# Patient Record
Sex: Female | Born: 1965 | Race: White | Hispanic: No | Smoking: Never smoker
Health system: Southern US, Community
[De-identification: ages and names within clinical notes are randomized; demographics above are authoritative.]

## PROBLEM LIST (undated history)

## (undated) DIAGNOSIS — R002 Palpitations: Secondary | ICD-10-CM

## (undated) DIAGNOSIS — G43909 Migraine, unspecified, not intractable, without status migrainosus: Secondary | ICD-10-CM

## (undated) DIAGNOSIS — L509 Urticaria, unspecified: Secondary | ICD-10-CM

## (undated) DIAGNOSIS — G2581 Restless legs syndrome: Secondary | ICD-10-CM

## (undated) DIAGNOSIS — F909 Attention-deficit hyperactivity disorder, unspecified type: Secondary | ICD-10-CM

## (undated) DIAGNOSIS — R51 Headache: Secondary | ICD-10-CM

## (undated) DIAGNOSIS — T783XXA Angioneurotic edema, initial encounter: Secondary | ICD-10-CM

## (undated) DIAGNOSIS — B009 Herpesviral infection, unspecified: Secondary | ICD-10-CM

## (undated) DIAGNOSIS — B029 Zoster without complications: Secondary | ICD-10-CM

## (undated) DIAGNOSIS — R42 Dizziness and giddiness: Secondary | ICD-10-CM

## (undated) DIAGNOSIS — R519 Headache, unspecified: Secondary | ICD-10-CM

## (undated) DIAGNOSIS — J189 Pneumonia, unspecified organism: Secondary | ICD-10-CM

## (undated) DIAGNOSIS — L719 Rosacea, unspecified: Secondary | ICD-10-CM

## (undated) DIAGNOSIS — F4310 Post-traumatic stress disorder, unspecified: Secondary | ICD-10-CM

## (undated) DIAGNOSIS — S069XAA Unspecified intracranial injury with loss of consciousness status unknown, initial encounter: Secondary | ICD-10-CM

## (undated) DIAGNOSIS — I10 Essential (primary) hypertension: Secondary | ICD-10-CM

## (undated) DIAGNOSIS — D649 Anemia, unspecified: Secondary | ICD-10-CM

## (undated) DIAGNOSIS — J45909 Unspecified asthma, uncomplicated: Secondary | ICD-10-CM

## (undated) DIAGNOSIS — K648 Other hemorrhoids: Secondary | ICD-10-CM

## (undated) DIAGNOSIS — S069X9A Unspecified intracranial injury with loss of consciousness of unspecified duration, initial encounter: Secondary | ICD-10-CM

## (undated) HISTORY — DX: Urticaria, unspecified: L50.9

## (undated) HISTORY — DX: Attention-deficit hyperactivity disorder, unspecified type: F90.9

## (undated) HISTORY — DX: Unspecified intracranial injury with loss of consciousness status unknown, initial encounter: S06.9XAA

## (undated) HISTORY — PX: HEMORRHOID BANDING: SHX5850

## (undated) HISTORY — DX: Dizziness and giddiness: R42

## (undated) HISTORY — DX: Essential (primary) hypertension: I10

## (undated) HISTORY — DX: Herpesviral infection, unspecified: B00.9

## (undated) HISTORY — DX: Other hemorrhoids: K64.8

## (undated) HISTORY — DX: Angioneurotic edema, initial encounter: T78.3XXA

## (undated) HISTORY — PX: COLONOSCOPY: SHX174

## (undated) HISTORY — DX: Zoster without complications: B02.9

## (undated) HISTORY — PX: OTHER SURGICAL HISTORY: SHX169

## (undated) HISTORY — DX: Post-traumatic stress disorder, unspecified: F43.10

## (undated) HISTORY — DX: Unspecified intracranial injury with loss of consciousness of unspecified duration, initial encounter: S06.9X9A

## (undated) HISTORY — DX: Rosacea, unspecified: L71.9

---

## 2001-02-19 ENCOUNTER — Encounter: Payer: Self-pay | Admitting: Internal Medicine

## 2001-02-19 ENCOUNTER — Encounter: Admission: RE | Admit: 2001-02-19 | Discharge: 2001-02-19 | Payer: Self-pay | Admitting: Internal Medicine

## 2001-04-06 ENCOUNTER — Ambulatory Visit (HOSPITAL_COMMUNITY): Admission: RE | Admit: 2001-04-06 | Discharge: 2001-04-06 | Payer: Self-pay | Admitting: Internal Medicine

## 2001-04-06 ENCOUNTER — Encounter: Payer: Self-pay | Admitting: Internal Medicine

## 2001-08-23 ENCOUNTER — Encounter: Admission: RE | Admit: 2001-08-23 | Discharge: 2001-08-23 | Payer: Self-pay | Admitting: Internal Medicine

## 2001-08-23 ENCOUNTER — Encounter: Payer: Self-pay | Admitting: Internal Medicine

## 2004-09-19 ENCOUNTER — Emergency Department (HOSPITAL_COMMUNITY): Admission: EM | Admit: 2004-09-19 | Discharge: 2004-09-19 | Payer: Self-pay | Admitting: Emergency Medicine

## 2005-08-22 ENCOUNTER — Emergency Department (HOSPITAL_COMMUNITY): Admission: EM | Admit: 2005-08-22 | Discharge: 2005-08-22 | Payer: Self-pay | Admitting: Emergency Medicine

## 2006-10-27 ENCOUNTER — Other Ambulatory Visit: Admission: RE | Admit: 2006-10-27 | Discharge: 2006-10-27 | Payer: Self-pay | Admitting: Family Medicine

## 2007-01-14 ENCOUNTER — Ambulatory Visit (HOSPITAL_COMMUNITY): Admission: RE | Admit: 2007-01-14 | Discharge: 2007-01-14 | Payer: Self-pay | Admitting: Orthopedic Surgery

## 2008-11-07 ENCOUNTER — Other Ambulatory Visit: Admission: RE | Admit: 2008-11-07 | Discharge: 2008-11-07 | Payer: Self-pay | Admitting: Family Medicine

## 2010-09-21 ENCOUNTER — Encounter: Payer: Self-pay | Admitting: Family Medicine

## 2010-09-21 ENCOUNTER — Ambulatory Visit (INDEPENDENT_AMBULATORY_CARE_PROVIDER_SITE_OTHER): Payer: BC Managed Care – PPO | Admitting: Family Medicine

## 2010-09-21 VITALS — BP 130/88 | Temp 98.1°F | Ht 65.0 in | Wt 196.0 lb

## 2010-09-21 DIAGNOSIS — J45909 Unspecified asthma, uncomplicated: Secondary | ICD-10-CM

## 2010-09-21 MED ORDER — BECLOMETHASONE DIPROPIONATE 40 MCG/ACT IN AERS: INHALATION_SPRAY | RESPIRATORY_TRACT | Status: DC

## 2010-09-21 NOTE — Patient Instructions (Signed)
Drink 24 ounces of water daily.  One puff of Qvar twice daily.  Albuterol p.r.n.  Claritin 10 mg plain every morning.  Return in two weeks for a 30 minute appointment for a physical examination

## 2010-09-21 NOTE — Progress Notes (Signed)
  Subjective:    Patient ID: Samantha Delacruz, female    DOB: 05/03/1966, 45 y.o.   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  She states that 19 days ago.  She went to an urgent care because her cough.  They x-rayed her sinuses and chest x-ray and told her that she had walking pneumonia.  They treated her with Biaxin 500 mg b.i.d. X 1 week prednisone burst and taper and gave her an albuterol inhaler.  She has had a history of recurrent asthma over the last 10 years.  However, she has never had a workup to determine etiology of her asthma.  She finished her medication however, she still coughing, and is here for evaluation of the persistent cough.  She's had one child by C-section, episodic migraines, recurrent urinary tract infections that are now resolved, recurrent cutaneous herpes, and some seasonal allergic rhinitis.  Last Pap and pelvic and mammogram two years ago.  Pulmonary review of systems negative except for a 10 year history of asthma.  Again, no workup has been done.  Tetanus booster 2009.   Review of Systems Negative except for above    Objective:   Physical Exam She is a well-developed, well-nourished, female, in no acute distress.  Examination of the HEENT were negative.  The neck was supple.  Thyroid was not enlarged.  No adenopathy.  Pulmonary exam shows symmetrical.  Breath sounds late expiratory wheezing bilaterally       Assessment & Plan:  Asthma  Plan lots of liquids, Qvar 40,,,,,,,,One puff  B.i.d.,Claritin plain, 10 mg q.a.m., albuterol p.r.n., follow-up CPX in two weeks.  Get old records

## 2010-09-30 ENCOUNTER — Encounter: Payer: Self-pay | Admitting: Family Medicine

## 2010-09-30 ENCOUNTER — Ambulatory Visit (INDEPENDENT_AMBULATORY_CARE_PROVIDER_SITE_OTHER): Payer: BC Managed Care – PPO | Admitting: Family Medicine

## 2010-09-30 ENCOUNTER — Other Ambulatory Visit (HOSPITAL_COMMUNITY)
Admission: RE | Admit: 2010-09-30 | Discharge: 2010-09-30 | Disposition: A | Discharge status: Home / Self Care | Payer: BC Managed Care – PPO | Source: Ambulatory Visit | Attending: Family Medicine | Admitting: Family Medicine

## 2010-09-30 VITALS — BP 110/80 | Temp 98.1°F | Ht 65.0 in | Wt 193.0 lb

## 2010-09-30 DIAGNOSIS — F908 Attention-deficit hyperactivity disorder, other type: Secondary | ICD-10-CM

## 2010-09-30 DIAGNOSIS — J45901 Unspecified asthma with (acute) exacerbation: Secondary | ICD-10-CM

## 2010-09-30 DIAGNOSIS — L719 Rosacea, unspecified: Secondary | ICD-10-CM

## 2010-09-30 DIAGNOSIS — F909 Attention-deficit hyperactivity disorder, unspecified type: Secondary | ICD-10-CM

## 2010-09-30 DIAGNOSIS — K625 Hemorrhage of anus and rectum: Secondary | ICD-10-CM

## 2010-09-30 DIAGNOSIS — J309 Allergic rhinitis, unspecified: Secondary | ICD-10-CM

## 2010-09-30 DIAGNOSIS — Z0000 Encounter for general adult medical examination without abnormal findings: Secondary | ICD-10-CM

## 2010-09-30 DIAGNOSIS — Z01419 Encounter for gynecological examination (general) (routine) without abnormal findings: Secondary | ICD-10-CM | POA: Insufficient documentation

## 2010-09-30 LAB — POCT URINALYSIS DIPSTICK
Leukocytes, UA: NEGATIVE
Spec Grav, UA: 1.02
Urobilinogen, UA: 0.2
pH, UA: 5

## 2010-09-30 MED ORDER — DOXYCYCLINE HYCLATE 100 MG PO TABS: 100.0000 mg | ORAL_TABLET | Freq: Two times a day (BID) | ORAL | Status: AC

## 2010-09-30 NOTE — Progress Notes (Signed)
  Subjective:    Patient ID: Samantha Delacruz, female    DOB: 19-Oct-1965, 45 y.o.   MRN: [id number]  HPI [name]  is a 45 year old female, married Nonsmoker who comes in today for a physical examination for evaluation of allergic rhinitis, asthma, rosacea, ADHD, intermittent rectal bleeding x 3 years.  She has allergic rhinitis, for which he takes Claritin, 10 mg daily.  She has underlying asthma, for which she takes Qvar 40 does 1 cup b.i.d.  She has a long-standing history of ADHD.  She was evaluated by a psychiatrist about 6 years ago and given Adderall parent coverages caused her mood to be depressed and sleep dysfunction.  Therefore, she stopped it.  She also has a history of rosacea.  She would like to discuss treatment options.  She also has a history of intermittent bright red blood.  The bleeding for 3 years.  She states she went to see a gastroenterologist at couple years ago and was told it was nothing to worry about.  The bleeding has persisted.  Negative family history of colon cancer or polyps.  She gets routine eye care not dental care and does not check her breasts monthly.  Last mammogram 5 years ago.  LMP 116 normal birth control.  Her husband is disabled.  There and able to have intercourse.  Tetanus booster 2006.   Review of Systems  Constitutional: Negative.   HENT: Negative.   Eyes: Negative.   Respiratory: Negative.   Cardiovascular: Negative.   Gastrointestinal: Negative.   Genitourinary: Negative.   Musculoskeletal: Negative.   Neurological: Negative.   Hematological: Negative.   Psychiatric/Behavioral: Negative.        Objective:   Physical Exam  Constitutional: She appears well-developed and well-nourished.  HENT:  Head: Normocephalic and atraumatic.  Right Ear: External ear normal.  Left Ear: External ear normal.  Nose: Nose normal.  Mouth/Throat: Oropharynx is clear and moist.  Eyes: EOM are normal. Pupils are equal, round, and reactive to  light.  Neck: Normal range of motion. Neck supple. No thyromegaly present.  Cardiovascular: Normal rate, regular rhythm, normal heart sounds and intact distal pulses.  Exam reveals no gallop and no friction rub.   No murmur heard. Pulmonary/Chest: Effort normal and breath sounds normal.  Abdominal: Soft. Bowel sounds are normal. She exhibits no distension and no mass. There is no tenderness. There is no rebound.  Genitourinary: Vagina normal and uterus normal. Guaiac negative stool. No vaginal discharge found.       Breast exam normal.  No palpable masses  Musculoskeletal: Normal range of motion.  Lymphadenopathy:    She has no cervical adenopathy.  Neurological: She is alert. She has normal reflexes. No cranial nerve deficit. She exhibits normal muscle tone. Coordination normal.  Skin: Skin is warm and dry.  Psychiatric: She has a normal mood and affect. Her behavior is normal. Judgment and thought content normal.       She tends to jump from topic to topic, consistent with ADHD          Assessment & Plan:  Allergic rhinitis,,,,,,,,,, plan continue Claritin, 10 mg daily.  Asthma ,,,,,,,,,, plan continue Qvar as outlined.  ADHD,,,,,,,,,,,,, declines medication.  Rosacea,,,,,,,,,,,,,, plan doxycycline 100 b.i.d., p.r.n..  Chest wall pain.  Motrin 600 b.i.d., p.r.n.  Intermittent right mid rectal bleeding.  Recommend GI consult.  Eye exam yearly, regular dental care, BSE monthly,  mammography,

## 2010-09-30 NOTE — Patient Instructions (Signed)
We will get to set up for a consult in GI for evaluation in the intermittent rectal bleeding.  Take doxycycline one twice daily for flareups of the rosacea.  Claritin.  Regular 10 mg daily.  Continue the Qvar ,,,,,,,, one puff twice daily  Dr. Vonna Kotyk is an excellent eyes, physician.  Dr. Alvester Morin is an excellent dentist.  Check her breasts monthly and call to get set up for mammogram at the breast Center.  Return in one year for follow-up

## 2010-10-01 ENCOUNTER — Encounter: Payer: BC Managed Care – PPO | Admitting: Family Medicine

## 2010-10-01 DIAGNOSIS — K625 Hemorrhage of anus and rectum: Secondary | ICD-10-CM

## 2010-10-01 DIAGNOSIS — Z0000 Encounter for general adult medical examination without abnormal findings: Secondary | ICD-10-CM

## 2010-10-01 LAB — LIPID PANEL
Cholesterol: 180 mg/dL (ref 0–200)
HDL: 44.8 mg/dL (ref >39.00)
LDL Cholesterol: 113 mg/dL — ABNORMAL HIGH (ref 0–99)
Total CHOL/HDL Ratio: 4
Triglycerides: 112 mg/dL (ref 0.0–149.0)

## 2010-10-01 LAB — CBC WITH DIFFERENTIAL/PLATELET
Basophils Absolute: 0.1 10*3/uL (ref 0.0–0.1)
Eosinophils Absolute: 0.2 10*3/uL (ref 0.0–0.7)
Lymphs Abs: 2.5 10*3/uL (ref 0.7–4.0)
MCHC: 34 g/dL (ref 30.0–36.0)
MCV: 86 fl (ref 78.0–100.0)
Monocytes Absolute: 0.6 10*3/uL (ref 0.1–1.0)
Neutrophils Relative %: 64.4 % (ref 43.0–77.0)
Platelets: 338 10*3/uL (ref 150.0–400.0)
RDW: 13.5 % (ref 11.5–14.6)
WBC: 9.6 10*3/uL (ref 4.5–10.5)

## 2010-10-01 LAB — BASIC METABOLIC PANEL
BUN: 11 mg/dL (ref 6–23)
CO2: 27 mEq/L (ref 19–32)
Calcium: 8.4 mg/dL (ref 8.4–10.5)
GFR: 83.31 mL/min (ref >60.00)
Glucose, Bld: 80 mg/dL (ref 70–99)
Potassium: 3.8 mEq/L (ref 3.5–5.1)
Sodium: 138 mEq/L (ref 135–145)

## 2010-10-01 LAB — HEPATIC FUNCTION PANEL
Bilirubin, Direct: 0.1 mg/dL (ref 0.0–0.3)
Total Bilirubin: 0.3 mg/dL (ref 0.3–1.2)

## 2011-02-28 ENCOUNTER — Encounter: Payer: Self-pay | Admitting: Family Medicine

## 2011-02-28 ENCOUNTER — Ambulatory Visit (INDEPENDENT_AMBULATORY_CARE_PROVIDER_SITE_OTHER): Payer: BC Managed Care – PPO | Admitting: Family Medicine

## 2011-02-28 VITALS — BP 120/80 | Temp 98.4°F | Wt 191.0 lb

## 2011-02-28 DIAGNOSIS — I8001 Phlebitis and thrombophlebitis of superficial vessels of right lower extremity: Secondary | ICD-10-CM

## 2011-02-28 DIAGNOSIS — I8000 Phlebitis and thrombophlebitis of superficial vessels of unspecified lower extremity: Secondary | ICD-10-CM

## 2011-02-28 NOTE — Patient Instructions (Signed)
Motrin 600 mg twice daily with food.  In the evening elevate her leg wrapped in a towel around her leg and put a heating pad over the toe.  Remember to use low heat.  Return p.r.n.

## 2011-02-28 NOTE — Progress Notes (Signed)
  Subjective:    Patient ID: Samantha Delacruz, female    DOB: 01/29/1966, 45 y.o.   MRN: [id number]  HPIcharlotte is a 45 y/o fem. w a 2 week h/o of pain in The inner side of her right thigh.  No history of trauma.    Review of Systems    General and vascular review of systems otherwise negative Objective:   Physical Exam Lomotil and nursing in no acute distress.  Examination the right lower extremity shows leg appears normal.  There is tenderness along the inner side of the left thigh with some superficial knots in the superficial varicose veins.  Pulses otherwise, normal.  No palpable masses       Assessment & Plan:  Superficial phlebitis, right leg.  Plan Motrin, elevation, heat.  Return p.r.n.

## 2011-05-16 ENCOUNTER — Encounter: Payer: Self-pay | Admitting: Family Medicine

## 2011-05-16 ENCOUNTER — Ambulatory Visit (INDEPENDENT_AMBULATORY_CARE_PROVIDER_SITE_OTHER): Payer: BC Managed Care – PPO | Admitting: Family Medicine

## 2011-05-16 VITALS — BP 110/80 | Temp 97.6°F | Wt 196.0 lb

## 2011-05-16 DIAGNOSIS — J45909 Unspecified asthma, uncomplicated: Secondary | ICD-10-CM

## 2011-05-16 MED ORDER — PREDNISONE 20 MG PO TABS: ORAL_TABLET | ORAL | Status: DC

## 2011-05-16 NOTE — Progress Notes (Signed)
  Subjective:    Patient ID: Samantha Delacruz, female    DOB: November 21, 1965, 45 y.o.   MRN: [id number]  HPICharlotte is a 45 year old female, married, nonsmoker, who comes in today for evaluation of allergic rhinitis and asthma.  About 9 days ago.  Her allergy symptoms began to flare with a congestion, postnasal drip, and cough.  She takes Qvar one puff b.i.d., Flovent b.i.d., and over-the-counter Claritin.  Despite that, she still symptomatic.    Review of Systems General an allergic review of systems otherwise negative    Objective:   Physical Exam Well-developed well-nourished, female, in no acute distress.  Examination of the HEENT was negative, except for bilateral nasal edema.  Neck was supple.  No adenopathy.  Lungs were clear except for some mild late expiratory wheezing       Assessment & Plan:  Allergic rhinitis with mild asthma.  Plan prednisone burst and taper

## 2011-05-16 NOTE — Patient Instructions (Signed)
Take the prednisone as directed.  Once y have topped the prednisone .................restart the other allergy medications.  Return p.r.n.

## 2011-05-18 ENCOUNTER — Telehealth: Payer: Self-pay | Admitting: *Deleted

## 2011-05-18 NOTE — Telephone Encounter (Signed)
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  She wants to be sure Dr. Tawanna Cooler does not want to do a strept screen on her since she still has sore throat.  Asking could she be a carrier?

## 2011-05-19 NOTE — Telephone Encounter (Signed)
Left message on machine for patient

## 2011-05-19 NOTE — Telephone Encounter (Signed)
She can come in this afternoon for a strep test

## 2011-06-06 ENCOUNTER — Telehealth: Payer: Self-pay | Admitting: Family Medicine

## 2011-06-06 NOTE — Telephone Encounter (Addendum)
Pt waiting on nurse to return call.[facility] [phone]

## 2011-06-06 NOTE — Telephone Encounter (Signed)
Please schedule an appointment for evaluation.

## 2011-06-06 NOTE — Telephone Encounter (Signed)
Pt has ? Uri or sinus inf. Pt is taking predisone 1/2 tab on m,w,f. Pt is coughing up brown, green and bright yellow phlegm.Pt req call back from nurse asap today. [facility]. Pt is working an can not get out of work for [employer].

## 2011-06-07 ENCOUNTER — Encounter: Payer: Self-pay | Admitting: Family Medicine

## 2011-06-07 ENCOUNTER — Ambulatory Visit (INDEPENDENT_AMBULATORY_CARE_PROVIDER_SITE_OTHER): Payer: BC Managed Care – PPO | Admitting: Family Medicine

## 2011-06-07 VITALS — BP 120/84 | Temp 98.3°F | Wt 194.0 lb

## 2011-06-07 DIAGNOSIS — J45909 Unspecified asthma, uncomplicated: Secondary | ICD-10-CM

## 2011-06-07 MED ORDER — HYDROCODONE-HOMATROPINE 5-1.5 MG/5ML PO SYRP: 5.0000 mL | ORAL_SOLUTION | Freq: Four times a day (QID) | ORAL | Status: AC | PRN

## 2011-06-07 NOTE — Telephone Encounter (Signed)
Pt is sch for today at 330pm

## 2011-06-07 NOTE — Patient Instructions (Signed)
Drink lots of water.  Do not take any the over-the-counter medicine.  One half to 1 teaspoon of the Hydromet, up to 3 times a day as needed for cough and cold.  Take one prednisone tablet daily for 5 days, a half a tab for 5 days, then half a tablet Monday, Wednesday, Friday, for a two-week taper.  When you begin to taper the prednisone to a half a tablet every other day, then restart the Claritin.  Return p.r.n.

## 2011-06-07 NOTE — Progress Notes (Signed)
  Subjective:    Patient ID: Samantha Delacruz, female    DOB: 08-18-1965, 45 y.o.   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  We saw a couple weeks ago with a flareup of her asthma.  At that time, we stopped her inhalers and start her on prednisone.  When she decreased her prednisone to a half a tablet every other day.  The cough increased pain.  No new symptoms.  She does have persistent sore throat, postnasal drip, and head congestion.  No fever, no sputum production    Review of Systems    General an ENT and cardiopulmonary is systems otherwise negative Objective:   Physical Exam  Well-developed well-nourished, female, in no acute distress.  Examination of the HEENT were negative.  Neck was supple.  No adenopathy.  Lungs are clear except for some mild late expiratory wheezing      Assessment & Plan:  Asthma flare.  Plan increase prednisone to one tab daily for 5 days and taper lots of liquids.  Cough syrup.  Return p.r.n.

## 2011-12-05 ENCOUNTER — Ambulatory Visit (INDEPENDENT_AMBULATORY_CARE_PROVIDER_SITE_OTHER): Payer: BC Managed Care – PPO | Admitting: Family Medicine

## 2011-12-05 ENCOUNTER — Encounter: Payer: Self-pay | Admitting: Family Medicine

## 2011-12-05 VITALS — BP 120/80 | HR 91 | Temp 98.6°F | Wt 200.0 lb

## 2011-12-05 DIAGNOSIS — M542 Cervicalgia: Secondary | ICD-10-CM

## 2011-12-05 MED ORDER — DOXYCYCLINE HYCLATE 100 MG PO CAPS: 100.0000 mg | ORAL_CAPSULE | Freq: Two times a day (BID) | ORAL | Status: AC

## 2011-12-05 NOTE — Progress Notes (Signed)
  Subjective:    Patient ID: Samantha Delacruz, female    DOB: November 09, 1965, 46 y.o.   MRN: [id number]  HPI Here for 2 days of swelling and pain in the back of the neck. No HA or fever or joint pains or nausea.    Review of Systems  Constitutional: Negative.   HENT: Positive for neck pain. Negative for neck stiffness.   Eyes: Negative.   Respiratory: Negative.   Cardiovascular: Negative.   Neurological: Negative.        Objective:   Physical Exam  Constitutional: She appears well-developed and well-nourished.  HENT:  Right Ear: External ear normal.  Left Ear: External ear normal.  Nose: Nose normal.  Mouth/Throat: Oropharynx is clear and moist. No oropharyngeal exudate.  Eyes: Conjunctivae are normal. Pupils are equal, round, and reactive to light.  Neck: Neck supple.  Lymphadenopathy:    She has no cervical adenopathy.  Skin:       The back of the neck is mildly swollen, warm, tender, and has a slightly raised annular rash about 3 cm across.           Assessment & Plan:  Probable tick bite. Cover with Doxycycline. Use Motrin and ice packs prn

## 2012-02-01 ENCOUNTER — Telehealth: Payer: Self-pay | Admitting: Family Medicine

## 2012-02-01 NOTE — Telephone Encounter (Signed)
aller: Samantha Delacruz/Patient; PCP: Roderick Pee.; CB#: [phone]; ; ; Call regarding Questions About Tetanus Shot;   she wants to know how soon she needs to have a tetanus shot after an injury.  Had PW today from a desk  No bleeding now, slight bruising and tiny wound  her last tetanus was 1995,  I advised her she had a 72 hr window to get her shot.  I reviewed wound care and signs of infection to watch for and connected her to office to set up appt to get her immunization

## 2012-02-02 ENCOUNTER — Ambulatory Visit (INDEPENDENT_AMBULATORY_CARE_PROVIDER_SITE_OTHER): Payer: BC Managed Care – PPO | Admitting: Family Medicine

## 2012-02-02 DIAGNOSIS — W540XXA Bitten by dog, initial encounter: Secondary | ICD-10-CM

## 2012-02-02 DIAGNOSIS — Z23 Encounter for immunization: Secondary | ICD-10-CM

## 2012-02-02 DIAGNOSIS — T148XXA Other injury of unspecified body region, initial encounter: Secondary | ICD-10-CM

## 2012-02-02 DIAGNOSIS — Z299 Encounter for prophylactic measures, unspecified: Secondary | ICD-10-CM

## 2012-02-02 NOTE — Telephone Encounter (Signed)
Tetanus booster today

## 2012-04-24 ENCOUNTER — Other Ambulatory Visit (HOSPITAL_COMMUNITY)
Admission: RE | Admit: 2012-04-24 | Discharge: 2012-04-24 | Disposition: A | Discharge status: Home / Self Care | Payer: BC Managed Care – PPO | Source: Ambulatory Visit | Attending: Gynecology | Admitting: Gynecology

## 2012-04-24 ENCOUNTER — Encounter: Payer: Self-pay | Admitting: Gynecology

## 2012-04-24 ENCOUNTER — Ambulatory Visit (INDEPENDENT_AMBULATORY_CARE_PROVIDER_SITE_OTHER): Payer: BC Managed Care – PPO | Admitting: Gynecology

## 2012-04-24 VITALS — BP 128/86 | Ht 65.0 in | Wt 199.0 lb

## 2012-04-24 DIAGNOSIS — R109 Unspecified abdominal pain: Secondary | ICD-10-CM

## 2012-04-24 DIAGNOSIS — N898 Other specified noninflammatory disorders of vagina: Secondary | ICD-10-CM

## 2012-04-24 DIAGNOSIS — N920 Excessive and frequent menstruation with regular cycle: Secondary | ICD-10-CM

## 2012-04-24 DIAGNOSIS — Z01419 Encounter for gynecological examination (general) (routine) without abnormal findings: Secondary | ICD-10-CM | POA: Insufficient documentation

## 2012-04-24 DIAGNOSIS — N926 Irregular menstruation, unspecified: Secondary | ICD-10-CM

## 2012-04-24 LAB — CBC WITH DIFFERENTIAL/PLATELET
Basophils Absolute: 0 10*3/uL (ref 0.0–0.1)
Basophils Relative: 0 % (ref 0–1)
Eosinophils Relative: 1 % (ref 0–5)
HCT: 37.4 % (ref 36.0–46.0)
MCHC: 34.2 g/dL (ref 30.0–36.0)
MCV: 82.2 fL (ref 78.0–100.0)
Monocytes Absolute: 0.9 10*3/uL (ref 0.1–1.0)
RDW: 14 % (ref 11.5–15.5)

## 2012-04-24 LAB — URINALYSIS W MICROSCOPIC + REFLEX CULTURE
Bilirubin Urine: NEGATIVE
Crystals: NONE SEEN
Glucose, UA: NEGATIVE mg/dL
Protein, ur: NEGATIVE mg/dL

## 2012-04-24 LAB — WET PREP FOR TRICH, YEAST, CLUE: Trich, Wet Prep: NONE SEEN

## 2012-04-24 MED ORDER — METRONIDAZOLE 500 MG PO TABS: 500.0000 mg | ORAL_TABLET | Freq: Two times a day (BID) | ORAL | Status: AC

## 2012-04-24 NOTE — Patient Instructions (Addendum)
Follow up for ultrasound as scheduled.  Call to Schedule your mammogram  Facilities in [place]: 1)  [facility], [address] Phone: [phone] 2)  [facility], [address] Phone: [phone] 3)  Dr. Bertrand at [facility]  [address] Phone: [phone]     Mammogram A mammogram is an X-ray test to find changes in a woman's breast. You should get a mammogram if:  You are 47 years of age or older  You have risk factors.   Your doctor recommends that you have one.  BEFORE THE TEST  Do not schedule the test the week before your period, especially if your breasts are sore during this time.  On the day of your mammogram:  Wash your breasts and armpits well. After washing, do not put on any deodorant or talcum powder on until after your test.   Eat and drink as you usually do.   Take your medicines as usual.   If you are diabetic and take insulin, make sure you:   Eat before coming for your test.   Take your insulin as usual.   If you cannot keep your appointment, call before the appointment to cancel. Schedule another appointment.  TEST  You will need to undress from the waist up. You will put on a hospital gown.   Your breast will be put on the mammogram machine, and it will press firmly on your breast with a piece of plastic called a compression paddle. This will make your breast flatter so that the machine can X-ray all parts of your breast.   Both breasts will be X-rayed. Each breast will be X-rayed from above and from the side. An X-ray might need to be taken again if the picture is not good enough.   The mammogram will last about 15 to 30 minutes.  AFTER THE TEST Finding out the results of your test Ask when your test results will be ready. Make sure you get your test results.  Document Released: 10/28/2008 Document Revised: 07/21/2011 Document  Reviewed: 10/28/2008 Exit[employer] Patient Information 2012 [employer].   

## 2012-04-24 NOTE — Progress Notes (Signed)
Samantha Delacruz [birth year] [id number]        46 y.o.  G1P1 due for annual exam with several issues noted below.  Past medical history,surgical history, medications, allergies, family history and social history were all reviewed and documented in the EPIC chart. ROS:  Was performed and pertinent positives and negatives are included in the history.  Exam: Fleet Contras assistant Filed Vitals:   04/24/12 1512  BP: 128/86  Height: 5\' 5"  (1.651 m)  Weight: 199 lb (90.266 kg)   General appearance  Normal Skin grossly normal Head/Neck normal with no cervical or supraclavicular adenopathy thyroid normal Lungs  clear Cardiac RR, without RMG Abdominal  soft, nontender, without masses, organomegaly or hernia Breasts  examined lying and sitting without masses, retractions, discharge or axillary adenopathy. Pelvic  Ext/BUS/vagina  normal with thick white mucousy discharge  Cervix  normal deviated to the left, high in the vault  Uterus  Anteverted deviated to the left, normal   size, shape and contour, mobile nontender   Adnexa  Without masses or tenderness    Anus and perineum  normal   Rectovaginal  normal sphincter tone without palpated masses or tenderness.    Assessment/Plan:  46 y.o. G1P1 female due for annual exam.   1. Heavy/mildly irregular menses. Patient knows of the past year or so her periods have become heavier where she wears double protection, clots, bleedthrough episodes. They will vary from heavy one month to light the next. They do occur monthly without intermenstrual bleeding. We'll check CBC TSH FSH sonohysterogram. Discussed various possibilities to include structural such as endometrial polyps/leiomyoma. Hormonal dysfunction also reviewed. Various options for management depending on results discussed such as hysteroscopy, hormonal manipulation Mirena IUD endometrial ablation and hysterectomy. Patient will follow up for lab results andsonohysterogram and we'll go from there. Her  cervix does deviate to the left high in the vault I discussed was involved with the sonohysterogram and that may require some manipulation. 2. Lower abdominal discomfort over the last several months.  No nausea vomiting diarrhea constipation or urinary symptoms. UA is contaminated follow up for culture and treat accordingly. Follow up for GYN ultrasound rule out nonpalpable abnormality such as ovarian cysts. There is possibilities including adhesions/endometriosis reviewed. 3. Heavy vaginal discharge over the last several months. Very mucousy watery historically. Also some pruritus. Prep consistent with BV. We'll treat with Flagyl 500 mg twice a day x7 days. Alcohol avoidance reviewed. 4. Pap smear. Pap/HPV done. No history of abnormal Paps previously with last Pap smear 2012. Assuming this Pap smear negative discussed every five-year screening per current screening guidelines. 5. Contraception. Patient not sexually active and has not been for several years. Does not anticipate this changing. The issues of contraception if it does reviewed. 6. Mammography. Patient's overdo and I strongly recommended that she schedule this and she understands my recommendation. Names of facilities provided in [place]. 7. Health maintenance. Patient sees Dr. Kelle Darting routinely and will continue to do so. No routine blood work done as it will be done through his office. Follow up for sonohysterogram and lab work.   Dara Lords MD, 4:01 PM 04/24/2012

## 2012-04-26 LAB — URINE CULTURE

## 2012-04-27 ENCOUNTER — Telehealth: Payer: Self-pay | Admitting: *Deleted

## 2012-04-27 ENCOUNTER — Other Ambulatory Visit: Payer: Self-pay | Admitting: Gynecology

## 2012-04-27 DIAGNOSIS — Z1231 Encounter for screening mammogram for malignant neoplasm of breast: Secondary | ICD-10-CM

## 2012-04-27 NOTE — Telephone Encounter (Signed)
Pt informed of recent annual lab results.

## 2012-05-15 ENCOUNTER — Ambulatory Visit
Admission: RE | Admit: 2012-05-15 | Discharge: 2012-05-15 | Disposition: A | Discharge status: Home / Self Care | Payer: BC Managed Care – PPO | Source: Ambulatory Visit | Attending: Gynecology | Admitting: Gynecology

## 2012-05-15 DIAGNOSIS — Z1231 Encounter for screening mammogram for malignant neoplasm of breast: Secondary | ICD-10-CM

## 2012-05-17 ENCOUNTER — Other Ambulatory Visit: Payer: Self-pay | Admitting: Gynecology

## 2012-05-17 ENCOUNTER — Other Ambulatory Visit: Payer: Self-pay | Admitting: *Deleted

## 2012-05-17 DIAGNOSIS — N63 Unspecified lump in unspecified breast: Secondary | ICD-10-CM

## 2012-05-17 DIAGNOSIS — R928 Other abnormal and inconclusive findings on diagnostic imaging of breast: Secondary | ICD-10-CM

## 2012-05-21 ENCOUNTER — Telehealth: Payer: Self-pay | Admitting: *Deleted

## 2012-05-21 MED ORDER — CLINDAMYCIN PHOSPHATE 2 % VA CREA: 1.0000 | TOPICAL_CREAM | Freq: Every day | VAGINAL | Status: DC

## 2012-05-21 NOTE — Telephone Encounter (Signed)
Recommend trying Cleocin vaginal cream nightly x1 week.  We will see if changing the class of medication and delivery route doesn't eradicate the discharge.

## 2012-05-21 NOTE — Telephone Encounter (Signed)
Pt called c/o heavy vaginal discharge starting again as OV 04/24/12. Pt said discharge is still clear, with odor. Pt asked if another could be given? Flagyl gave her a little reaction, she broke out with bumps on her arm toward the end of taking the Rx.  Please advise if pt should have OV or another rx okay?

## 2012-05-22 NOTE — Telephone Encounter (Signed)
Pt informed with the below note. 

## 2012-05-25 ENCOUNTER — Other Ambulatory Visit: Payer: Self-pay | Admitting: Gynecology

## 2012-05-25 ENCOUNTER — Ambulatory Visit (INDEPENDENT_AMBULATORY_CARE_PROVIDER_SITE_OTHER): Payer: BC Managed Care – PPO

## 2012-05-25 ENCOUNTER — Ambulatory Visit: Payer: BC Managed Care – PPO

## 2012-05-25 ENCOUNTER — Ambulatory Visit (INDEPENDENT_AMBULATORY_CARE_PROVIDER_SITE_OTHER): Payer: BC Managed Care – PPO | Admitting: Gynecology

## 2012-05-25 ENCOUNTER — Encounter: Payer: Self-pay | Admitting: Gynecology

## 2012-05-25 DIAGNOSIS — R1032 Left lower quadrant pain: Secondary | ICD-10-CM

## 2012-05-25 DIAGNOSIS — N898 Other specified noninflammatory disorders of vagina: Secondary | ICD-10-CM

## 2012-05-25 DIAGNOSIS — N946 Dysmenorrhea, unspecified: Secondary | ICD-10-CM

## 2012-05-25 DIAGNOSIS — N920 Excessive and frequent menstruation with regular cycle: Secondary | ICD-10-CM

## 2012-05-25 NOTE — Progress Notes (Signed)
Patient presents for sonohysterogram due to history of heavy menses with some mild irregularity. TSH FSH were normal. Ultrasound shows normal appearing uterus with small 12 mm leiomyoma. Endometrial echo 10 mm. Right and left ovaries normal with physiologic changes. Cul-de-sac negative. Sonohysterogram performed, sterile technique single-tooth tenaculum anterior lip stabilization, good distention with no abnormalities noted. Endometrial sample taken. Patient tolerated well.  Assessment and plan: Menorrhagia mild menstrual regularity normal hormone studies negative sonohysterogram. Options for management include observation, hormonal manipulation, Mirena IUD endometrial ablation hysterectomy all reviewed. Patient taking about Mirena IUD. Literature given. Risks reviewed to include infection, perforation or migration requiring surgery, failure with pregnancy all reviewed. Patient will follow up if she decides to pursue this. She'll follow up for biopsy results from sonohysterogram.  Patient does note a discharge. She had resolution of her prior discharge with Flagyl but it seemed to recur after week. She was prescribed Cleocin vaginal cream over the phone but hasn't started it yet and will do so tomorrow evening. I ordered a urinalysis.

## 2012-05-25 NOTE — Patient Instructions (Signed)
Follow up for biopsy results. Follow up if you decide to pursue Mirena IUD. Call if any questions.

## 2012-05-26 LAB — URINALYSIS W MICROSCOPIC + REFLEX CULTURE
Crystals: NONE SEEN
Leukocytes, UA: NEGATIVE
Protein, ur: NEGATIVE mg/dL
pH: 7.5 (ref 5.0–8.0)

## 2012-05-27 ENCOUNTER — Telehealth: Payer: Self-pay | Admitting: Gynecology

## 2012-05-27 LAB — URINE CULTURE

## 2012-05-27 MED ORDER — AMPICILLIN 500 MG PO CAPS: 500.0000 mg | ORAL_CAPSULE | Freq: Four times a day (QID) | ORAL | Status: DC

## 2012-05-27 NOTE — Telephone Encounter (Signed)
Tell patient bacteria in urine.  Recommend antibiotics as prescribed.

## 2012-05-28 NOTE — Telephone Encounter (Signed)
Left message for pt to call.

## 2012-05-28 NOTE — Telephone Encounter (Signed)
Pt informed with the below note. 

## 2012-06-05 ENCOUNTER — Ambulatory Visit
Admission: RE | Admit: 2012-06-05 | Discharge: 2012-06-05 | Disposition: A | Discharge status: Home / Self Care | Payer: BC Managed Care – PPO | Source: Ambulatory Visit | Attending: Gynecology | Admitting: Gynecology

## 2012-06-05 DIAGNOSIS — N63 Unspecified lump in unspecified breast: Secondary | ICD-10-CM

## 2012-06-06 ENCOUNTER — Telehealth: Payer: Self-pay | Admitting: *Deleted

## 2012-06-06 NOTE — Telephone Encounter (Signed)
It probably is the best/cheapest to take. One with breakfast one with lunch one at dinner and one at bedtime should not interfere with work

## 2012-06-06 NOTE — Telephone Encounter (Signed)
Pt informed with the below note. 

## 2012-06-06 NOTE — Telephone Encounter (Signed)
Left message for pt to call.

## 2012-06-06 NOTE — Telephone Encounter (Signed)
Pt giving ampicillin 500 mg 1po 4 times daily(05/28/12) she is requesting another medication if possible. Pt is having hard time taking this medication with her work schedule. Please advise

## 2012-06-14 ENCOUNTER — Ambulatory Visit (INDEPENDENT_AMBULATORY_CARE_PROVIDER_SITE_OTHER): Payer: BC Managed Care – PPO | Admitting: Gynecology

## 2012-06-14 ENCOUNTER — Encounter: Payer: Self-pay | Admitting: Gynecology

## 2012-06-14 DIAGNOSIS — B3731 Acute candidiasis of vulva and vagina: Secondary | ICD-10-CM

## 2012-06-14 DIAGNOSIS — N949 Unspecified condition associated with female genital organs and menstrual cycle: Secondary | ICD-10-CM

## 2012-06-14 DIAGNOSIS — B373 Candidiasis of vulva and vagina: Secondary | ICD-10-CM

## 2012-06-14 DIAGNOSIS — N9489 Other specified conditions associated with female genital organs and menstrual cycle: Secondary | ICD-10-CM

## 2012-06-14 LAB — URINALYSIS W MICROSCOPIC + REFLEX CULTURE
Casts: NONE SEEN
Crystals: NONE SEEN
Leukocytes, UA: NEGATIVE
Nitrite: NEGATIVE
Specific Gravity, Urine: >1.03 — ABNORMAL HIGH (ref 1.005–1.030)
pH: 5.5 (ref 5.0–8.0)

## 2012-06-14 LAB — WET PREP FOR TRICH, YEAST, CLUE: WBC, Wet Prep HPF POC: NONE SEEN

## 2012-06-14 MED ORDER — NYSTATIN-TRIAMCINOLONE 100000-0.1 UNIT/GM-% EX OINT: TOPICAL_OINTMENT | Freq: Two times a day (BID) | CUTANEOUS | Status: DC

## 2012-06-14 MED ORDER — FLUCONAZOLE 200 MG PO TABS: 200.0000 mg | ORAL_TABLET | Freq: Every day | ORAL | Status: DC

## 2012-06-14 NOTE — Progress Notes (Signed)
Patient presents having recently been evaluated for menorrhagia. She currently is thinking about her options and considering a Mirena IUD. She was seen with [facility] and given Cleocin vaginal cream but had not started it yet was found to have a UTI and treated with ampicillin and now has developed a significant vaginal discharge perineal and rectal itching.  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 Vagina with thick white discharge. Cervix normal. Uterus grossly normal in size midline mobile nontender. Adnexa without masses or tenderness. Perirectal exam with erythema consistent with fungal.  Assessment and plan: Exam history and wet prep consistent with yeast vulvovaginitis and perirectal involvement. We'll treat with Diflucan 200 daily x5 days and Mytrex externally twice a day to 3 times a day follow up if symptoms persist or recur. Patient's thinking of her options and will follow up with her decision as far as her menorrhagia is concerned. I've asked her to hold on the Cleocin vaginal cream treat the yeast and then we'll see if her symptoms don't resolve.

## 2012-06-14 NOTE — Patient Instructions (Signed)
Use yeast cream externally 2-3 times daily. Take Diflucan daily for 5 days. Follow up if symptoms persist or recur. Follow up with your decision as far as heavy period treatment.

## 2012-06-16 LAB — URINE CULTURE: Colony Count: 75000

## 2012-09-10 ENCOUNTER — Encounter: Payer: Self-pay | Admitting: Family Medicine

## 2012-09-10 ENCOUNTER — Ambulatory Visit (INDEPENDENT_AMBULATORY_CARE_PROVIDER_SITE_OTHER): Payer: BC Managed Care – PPO | Admitting: Family Medicine

## 2012-09-10 VITALS — BP 120/80 | Temp 98.7°F | Wt 193.0 lb

## 2012-09-10 DIAGNOSIS — J020 Streptococcal pharyngitis: Secondary | ICD-10-CM

## 2012-09-10 DIAGNOSIS — Z0000 Encounter for general adult medical examination without abnormal findings: Secondary | ICD-10-CM

## 2012-09-10 DIAGNOSIS — J029 Acute pharyngitis, unspecified: Secondary | ICD-10-CM

## 2012-09-10 MED ORDER — AMOXICILLIN 500 MG PO CAPS: 500.0000 mg | ORAL_CAPSULE | Freq: Three times a day (TID) | ORAL | Status: DC

## 2012-09-10 MED ORDER — HYDROCODONE-HOMATROPINE 5-1.5 MG/5ML PO SYRP: 5.0000 mL | ORAL_SOLUTION | Freq: Three times a day (TID) | ORAL | Status: DC | PRN

## 2012-09-10 NOTE — Patient Instructions (Addendum)
Take the antibiotic until the bottle is empty  Hydromet 1/2-1 teaspoon at bedtime when necessary for severe sore throat  Return when necessary

## 2012-09-10 NOTE — Progress Notes (Signed)
  Subjective:    Patient ID: Samantha Delacruz, female    DOB: Aug 23, 1965, 47 y.o.   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 she is a Data processing manager,,,,,,,,,, have had strep throat recently   Review of Systems Review of systems otherwise negative    Objective:   Physical Exam  Well-developed well-nourished female no acute distress examination of the HEENT negative neck was supple there were diffuse adenopathy tonsils were red and swollen  Rapid strep positive      Assessment & Plan:  Strep throat plan treat with amoxicillin 3 times a day for 10 days

## 2012-09-25 ENCOUNTER — Other Ambulatory Visit (INDEPENDENT_AMBULATORY_CARE_PROVIDER_SITE_OTHER): Payer: BC Managed Care – PPO

## 2012-09-25 DIAGNOSIS — Z0000 Encounter for general adult medical examination without abnormal findings: Secondary | ICD-10-CM

## 2012-09-25 LAB — HEPATIC FUNCTION PANEL
ALT: 19 U/L (ref 0–35)
Bilirubin, Direct: 0 mg/dL (ref 0.0–0.3)
Total Bilirubin: 0.6 mg/dL (ref 0.3–1.2)

## 2012-09-25 LAB — BASIC METABOLIC PANEL
BUN: 13 mg/dL (ref 6–23)
Creatinine, Ser: 0.6 mg/dL (ref 0.4–1.2)
GFR: 115.68 mL/min (ref >60.00)
Glucose, Bld: 87 mg/dL (ref 70–99)

## 2012-09-25 LAB — POCT URINALYSIS DIPSTICK
Bilirubin, UA: NEGATIVE
Glucose, UA: NEGATIVE
Ketones, UA: NEGATIVE
Leukocytes, UA: NEGATIVE
Nitrite, UA: NEGATIVE
pH, UA: 5.5

## 2012-09-25 LAB — LIPID PANEL
Cholesterol: 161 mg/dL (ref 0–200)
HDL: 39.5 mg/dL (ref >39.00)
Triglycerides: 61 mg/dL (ref 0.0–149.0)
VLDL: 12.2 mg/dL (ref 0.0–40.0)

## 2012-09-25 LAB — CBC WITH DIFFERENTIAL/PLATELET
Basophils Absolute: 0 10*3/uL (ref 0.0–0.1)
Eosinophils Relative: 0.7 % (ref 0.0–5.0)
MCV: 83.8 fl (ref 78.0–100.0)
Monocytes Absolute: 0.7 10*3/uL (ref 0.1–1.0)
Neutrophils Relative %: 64.7 % (ref 43.0–77.0)
Platelets: 349 10*3/uL (ref 150.0–400.0)
RDW: 13.8 % (ref 11.5–14.6)
WBC: 9.2 10*3/uL (ref 4.5–10.5)

## 2012-09-25 LAB — TSH: TSH: 1.52 u[IU]/mL (ref 0.35–5.50)

## 2012-10-02 ENCOUNTER — Ambulatory Visit (INDEPENDENT_AMBULATORY_CARE_PROVIDER_SITE_OTHER): Payer: BC Managed Care – PPO | Admitting: Family Medicine

## 2012-10-02 ENCOUNTER — Encounter: Payer: Self-pay | Admitting: Family Medicine

## 2012-10-02 VITALS — BP 120/80 | Temp 98.7°F | Ht 65.0 in | Wt 192.0 lb

## 2012-10-02 DIAGNOSIS — E663 Overweight: Secondary | ICD-10-CM

## 2012-10-02 DIAGNOSIS — N6019 Diffuse cystic mastopathy of unspecified breast: Secondary | ICD-10-CM

## 2012-10-02 DIAGNOSIS — Z0000 Encounter for general adult medical examination without abnormal findings: Secondary | ICD-10-CM

## 2012-10-02 DIAGNOSIS — J45909 Unspecified asthma, uncomplicated: Secondary | ICD-10-CM

## 2012-10-02 DIAGNOSIS — L570 Actinic keratosis: Secondary | ICD-10-CM

## 2012-10-02 NOTE — Patient Instructions (Signed)
Return for a 30 minute appointment sometime in the next 2-3 weeks for removal of the 3 lesions we discussed  Begin a diet and exercise program  Do a thorough breast exam monthly  Return in one year for general physical examination sooner if any problems

## 2012-10-02 NOTE — Progress Notes (Signed)
  Subjective:    Patient ID: Samantha Delacruz, female    DOB: 1966-01-08, 47 y.o.   MRN: [id number]  HPI Samantha Delacruz is a 47 year old married female nonsmoker who comes in today for general physical examination  She takes 10 mg Claritin daily for allergic rhinitis  She has 3 lesions one on her left leg to her right calf that been chronically irritated  She's concerned about her weight and is beginning a diet and exercise program current weight 192 height 65 inches  LMP a month ago normal no birth control she sees her GYN yearly for Pap had a mammogram couple months ago. History of fibrocystic changes therefore she states she does not check her breasts monthly   Review of Systems  Constitutional: Negative.   HENT: Negative.   Eyes: Negative.   Respiratory: Negative.   Cardiovascular: Negative.   Gastrointestinal: Negative.   Genitourinary: Negative.   Musculoskeletal: Negative.   Neurological: Negative.   Psychiatric/Behavioral: Negative.        Objective:   Physical Exam  Constitutional: She appears well-developed and well-nourished.  HENT:  Head: Normocephalic and atraumatic.  Right Ear: External ear normal.  Left Ear: External ear normal.  Nose: Nose normal.  Mouth/Throat: Oropharynx is clear and moist.  Eyes: EOM are normal. Pupils are equal, round, and reactive to light.  Neck: Normal range of motion. Neck supple. No thyromegaly present.  Cardiovascular: Normal rate, regular rhythm, normal heart sounds and intact distal pulses.  Exam reveals no gallop and no friction rub.   No murmur heard. Pulmonary/Chest: Effort normal and breath sounds normal.  Abdominal: Soft. Bowel sounds are normal. She exhibits no distension and no mass. There is no tenderness. There is no rebound.  Genitourinary:  Bilateral breast exam normal except for multiple small movable fibrocystic lesions throughout both breasts BSE was taught    Musculoskeletal: Normal range of motion.   Lymphadenopathy:    She has no cervical adenopathy.  Neurological: She is alert. She has normal reflexes. No cranial nerve deficit. She exhibits normal muscle tone. Coordination normal.  Skin: Skin is warm and dry.  Total body skin exam normal except for 3 lesions that appear to be inflamed actinic keratosis one on her left side sooner let right calf advised to return for removal  Psychiatric: She has a normal mood and affect. Her behavior is normal. Judgment and thought content normal.          Assessment & Plan:  Healthy female  Overweight begin diet exercise and weight loss program  Fibrocystic breast changes advise BSE monthly and you mammography  3 abnormal skin lesions return for removal ASAP

## 2012-10-23 ENCOUNTER — Ambulatory Visit: Payer: BC Managed Care – PPO | Admitting: Family Medicine

## 2012-10-23 DIAGNOSIS — Z0289 Encounter for other administrative examinations: Secondary | ICD-10-CM

## 2012-10-30 ENCOUNTER — Ambulatory Visit: Payer: BC Managed Care – PPO | Admitting: Family

## 2012-11-20 ENCOUNTER — Ambulatory Visit (INDEPENDENT_AMBULATORY_CARE_PROVIDER_SITE_OTHER): Payer: BC Managed Care – PPO | Admitting: Family Medicine

## 2012-11-20 ENCOUNTER — Other Ambulatory Visit: Payer: Self-pay | Admitting: *Deleted

## 2012-11-20 ENCOUNTER — Encounter: Payer: Self-pay | Admitting: Family Medicine

## 2012-11-20 VITALS — BP 124/80 | Temp 98.7°F | Wt 196.0 lb

## 2012-11-20 DIAGNOSIS — G9001 Carotid sinus syncope: Secondary | ICD-10-CM | POA: Insufficient documentation

## 2012-11-20 NOTE — Patient Instructions (Addendum)
Motrin 400 mg 3 times daily with food  Return when necessary

## 2012-11-20 NOTE — Progress Notes (Signed)
  Subjective:    Patient ID: Samantha Delacruz, female    DOB: [birth year], 47 y.o.   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 No fever chills sore throat earache etc. No recent dental work. She took some Motrin and seems to be better today. There has been strep throat going to her classroom   Review of Systems    review of systems otherwise negative Objective:   Physical Exam  Well-developed well-nourished female no acute distress examination of the HEENT were negative except for tenderness along the carotid artery on the right  No adenopathy,,,,,,,,,,salivary glands normal      Assessment & Plan:  Carotidynia plan Motrin

## 2012-12-11 ENCOUNTER — Telehealth: Payer: Self-pay | Admitting: *Deleted

## 2012-12-11 DIAGNOSIS — IMO0001 Reserved for inherently not codable concepts without codable children: Secondary | ICD-10-CM

## 2012-12-11 NOTE — Telephone Encounter (Signed)
Pt called because order will need to be placed for 6 month follow up per breast center result note on 05/2012.  Follow up u/s left breast placed.

## 2012-12-21 ENCOUNTER — Ambulatory Visit
Admission: RE | Admit: 2012-12-21 | Discharge: 2012-12-21 | Disposition: A | Discharge status: Home / Self Care | Payer: BC Managed Care – PPO | Source: Ambulatory Visit | Attending: Gynecology | Admitting: Gynecology

## 2012-12-21 DIAGNOSIS — IMO0001 Reserved for inherently not codable concepts without codable children: Secondary | ICD-10-CM

## 2013-05-07 ENCOUNTER — Encounter: Payer: Self-pay | Admitting: Family Medicine

## 2013-05-07 ENCOUNTER — Ambulatory Visit (INDEPENDENT_AMBULATORY_CARE_PROVIDER_SITE_OTHER): Payer: BC Managed Care – PPO | Admitting: Family Medicine

## 2013-05-07 VITALS — BP 122/80 | HR 108 | Temp 98.1°F | Wt 186.0 lb

## 2013-05-07 DIAGNOSIS — J441 Chronic obstructive pulmonary disease with (acute) exacerbation: Secondary | ICD-10-CM

## 2013-05-07 DIAGNOSIS — J209 Acute bronchitis, unspecified: Secondary | ICD-10-CM

## 2013-05-07 DIAGNOSIS — J45901 Unspecified asthma with (acute) exacerbation: Secondary | ICD-10-CM

## 2013-05-07 MED ORDER — BENZONATATE 200 MG PO CAPS: 200.0000 mg | ORAL_CAPSULE | Freq: Three times a day (TID) | ORAL | Status: DC | PRN

## 2013-05-07 MED ORDER — FLUTICASONE-SALMETEROL 250-50 MCG/DOSE IN AEPB: 1.0000 | INHALATION_SPRAY | Freq: Two times a day (BID) | RESPIRATORY_TRACT | Status: DC

## 2013-05-07 MED ORDER — AZITHROMYCIN 250 MG PO TABS: ORAL_TABLET | ORAL | Status: DC

## 2013-05-07 MED ORDER — ALBUTEROL SULFATE HFA 108 (90 BASE) MCG/ACT IN AERS: 2.0000 | INHALATION_SPRAY | RESPIRATORY_TRACT | Status: DC | PRN

## 2013-05-07 MED ORDER — METHYLPREDNISOLONE ACETATE 80 MG/ML IJ SUSP
120.0000 mg | Freq: Once | INTRAMUSCULAR | Status: AC
  Administered 2013-05-07: 120 mg via INTRAMUSCULAR

## 2013-05-07 NOTE — Addendum Note (Signed)
Addended by: Azucena Freed on: 05/07/2013 12:55 PM   Modules accepted: Orders

## 2013-05-07 NOTE — Progress Notes (Signed)
  Subjective:    Patient ID: Samantha Delacruz, female    DOB: 10/17/1965, 47 y.o.   MRN: [id number]  HPI Here for 4 days of chest tightness, wheezing, and a dry cough. No fever.    Review of Systems  Constitutional: Negative.   HENT: Negative.   Eyes: Negative.   Respiratory: Positive for cough, chest tightness, shortness of breath and wheezing.   Cardiovascular: Negative.        Objective:   Physical Exam  Constitutional: She appears well-developed and well-nourished.  HENT:  Right Ear: External ear normal.  Left Ear: External ear normal.  Nose: Nose normal.  Mouth/Throat: Oropharynx is clear and moist.  Eyes: Conjunctivae are normal.  Pulmonary/Chest: Effort normal. She has no rales.  Diffuse wheezes and rhonchi   Lymphadenopathy:    She has no cervical adenopathy.          Assessment & Plan:  Recheck prn

## 2013-05-16 ENCOUNTER — Ambulatory Visit (INDEPENDENT_AMBULATORY_CARE_PROVIDER_SITE_OTHER): Payer: BC Managed Care – PPO | Admitting: Family Medicine

## 2013-05-16 ENCOUNTER — Encounter: Payer: Self-pay | Admitting: Family Medicine

## 2013-05-16 DIAGNOSIS — J45909 Unspecified asthma, uncomplicated: Secondary | ICD-10-CM

## 2013-05-16 DIAGNOSIS — J45901 Unspecified asthma with (acute) exacerbation: Secondary | ICD-10-CM

## 2013-05-16 MED ORDER — PREDNISONE 20 MG PO TABS: ORAL_TABLET | ORAL | Status: DC

## 2013-05-16 NOTE — Patient Instructions (Addendum)
Drink lots of water  Advair.......... 2 puffs twice daily  Albuterol,,,,,,,, 2 puffs 4 times daily as needed  Prednisone........ 2 tabs now then 2 tabs every morning for 5 days then taper as outlined  Return in one week for followup sooner if any problems

## 2013-05-16 NOTE — Progress Notes (Signed)
  Subjective:    Patient ID: Samantha Delacruz, female    DOB: June 18, 1966, 47 y.o.   MRN: [id number]  HPI Tharon is a 47 year old female nonsmoker who comes in today for evaluation of asthma  She is supposed to be taking Advair 1 puff twice a day and she's not been taking it. She always has a flare of her asthma in the fall. Over the weekend she developed a flare of her asthma and was seen here on Monday by Dr. Clent Ridges. He gave her a Z-Pak. She denies any fever sputum production et Karie Soda. He also gave her a shot of steroids. She does not feel like she is improved.   Review of Systems Review of systems negative    Objective:   Physical Exam  Well-developed and nourished female no acute distress vital signs stable she is afebrile respiratory rate 12 and unlabored pulse ox 97 on room air in general she is a well-developed well-nourished female no acute distress HEENT negative neck was supple no adenopathy lungs are clear except for symmetrical inspiratory and expiratory mild wheezing      Assessment & Plan:  Asthma plan prednisone burst and taper restart inhalers lots of water Hydromet when necessary for cough rescue inhaler albuterol when necessary 2 puffs 4 times a day followup in one week

## 2013-05-23 ENCOUNTER — Ambulatory Visit (INDEPENDENT_AMBULATORY_CARE_PROVIDER_SITE_OTHER): Payer: BC Managed Care – PPO | Admitting: Family Medicine

## 2013-05-23 ENCOUNTER — Encounter: Payer: Self-pay | Admitting: Family Medicine

## 2013-05-23 VITALS — BP 120/80 | Temp 98.4°F

## 2013-05-23 DIAGNOSIS — L820 Inflamed seborrheic keratosis: Secondary | ICD-10-CM

## 2013-05-23 DIAGNOSIS — J45901 Unspecified asthma with (acute) exacerbation: Secondary | ICD-10-CM

## 2013-05-23 DIAGNOSIS — L570 Actinic keratosis: Secondary | ICD-10-CM

## 2013-05-23 NOTE — Addendum Note (Signed)
Addended by: Kern Reap B on: 05/23/2013 01:31 PM   Modules accepted: Orders

## 2013-05-23 NOTE — Patient Instructions (Signed)
Taper the prednisone as outlined one daily until the weekend in dropped down to a half a tablet Saturday and Sunday then a half a tablet Monday Wednesday Friday starting next week for 3 week taper  Decrease the Advair to one puff twice daily  Return when necessary

## 2013-05-23 NOTE — Progress Notes (Signed)
  Subjective:    Patient ID: Samantha Delacruz, female    DOB: 1965-11-29, 47 y.o.   MRN: [id number]  HPI Samantha Delacruz is a 47 year old married female nonsmoker who comes in today for followup of asthma and have a mole removed   We saw her recently with a flare of her asthma we doubled her inhaled steroid Advair up to 2 puffs twice a day albuterol 2 puffs 4 times a day when necessary and prednisone 40 mg for 5 days then taper. She's down to one prednisone a day and feels 60% better she's not had to use her albuterol recently  She has an enlarging lesion left lateral thigh this changed in color and would like it removed   Review of Systems Review of systems negative    Objective:   Physical Exam Well-developed well-nourished female no acute distress examination the lungs show symmetrical breath sounds mild late expiratory wheezing bilaterally  She was then taken to the treatment room after informed consent the lesion was cleaned with alcohol anesthetized with 1% Xylocaine with epinephrine and removed in toto. It was sent for pathologic analysis. The bases cauterized Band-Aids applied.       Assessment & Plan:  Asthma resolving very nicely plan taper prednisone as outlined  Lesion left thyromegaly with path pending

## 2013-06-19 ENCOUNTER — Telehealth: Payer: Self-pay | Admitting: Family Medicine

## 2013-06-19 NOTE — Telephone Encounter (Signed)
pt has vertigo,on and off since sun. Back pain w/ dizzyness also. Nerve /shingle pain in leg too,  Several issues.  Pt would like to come in after 2:30pm. pls advise

## 2013-06-20 ENCOUNTER — Ambulatory Visit (INDEPENDENT_AMBULATORY_CARE_PROVIDER_SITE_OTHER): Payer: BC Managed Care – PPO | Admitting: Family Medicine

## 2013-06-20 ENCOUNTER — Encounter: Payer: Self-pay | Admitting: Family Medicine

## 2013-06-20 VITALS — BP 130/80 | Temp 98.0°F | Wt 185.0 lb

## 2013-06-20 DIAGNOSIS — H8110 Benign paroxysmal vertigo, unspecified ear: Secondary | ICD-10-CM | POA: Insufficient documentation

## 2013-06-20 DIAGNOSIS — G9001 Carotid sinus syncope: Secondary | ICD-10-CM

## 2013-06-20 NOTE — Progress Notes (Signed)
  Subjective:    Patient ID: Samantha Delacruz, female    DOB: 1966/01/12, 47 y.o.   MRN: [id number]  HPI Sheresa is a 47 year old female who comes in today for evaluation of 2 problems actually 3  She states she's had vertigo off and on for a long time over the past week it's gotten worse. It started Sunday when she went to get out of bed and noticed the room spinning. i wan' as bad as it usually is and that seemed to calm dn it does seem somewhat positional but she cannot really identify any particular triggers or positions that cause of vertigo.  No hearing loss  She also has some inflammation of her right carotid artery and some sciatica and has been taking Motrin 600 twice a day and it's helped   Review of Systems Review of systems negative    Objective:   Physical Exam Well-developed well-nourished female no acute distress HEENT negative       Assessment & Plan:  Benign positional vertigo recurrent reassured......... ENT consult if symptoms persist  Carotidynia Motrin 600 3 times a day with food  Low back pain Motrin 600 mg 3 times a day with food when necessary

## 2013-06-20 NOTE — Telephone Encounter (Signed)
Spoke with patient and she will come to the office now.

## 2013-06-20 NOTE — Patient Instructions (Signed)
Motrin 600 mg twice daily with food for back pain or neck pain  If the vertigo gets worse I would consult with Dr. Dillard Cannon

## 2013-07-04 ENCOUNTER — Encounter: Payer: Self-pay | Admitting: Gynecology

## 2013-08-25 ENCOUNTER — Emergency Department (HOSPITAL_COMMUNITY)
Admission: EM | Admit: 2013-08-25 | Discharge: 2013-08-25 | Disposition: A | Discharge status: Home / Self Care | Payer: BC Managed Care – PPO | Attending: Emergency Medicine | Admitting: Emergency Medicine

## 2013-08-25 ENCOUNTER — Encounter (HOSPITAL_COMMUNITY): Payer: Self-pay | Admitting: Emergency Medicine

## 2013-08-25 DIAGNOSIS — F909 Attention-deficit hyperactivity disorder, unspecified type: Secondary | ICD-10-CM | POA: Insufficient documentation

## 2013-08-25 DIAGNOSIS — K219 Gastro-esophageal reflux disease without esophagitis: Secondary | ICD-10-CM | POA: Insufficient documentation

## 2013-08-25 DIAGNOSIS — L719 Rosacea, unspecified: Secondary | ICD-10-CM | POA: Insufficient documentation

## 2013-08-25 DIAGNOSIS — S61209A Unspecified open wound of unspecified finger without damage to nail, initial encounter: Secondary | ICD-10-CM

## 2013-08-25 DIAGNOSIS — G43909 Migraine, unspecified, not intractable, without status migrainosus: Secondary | ICD-10-CM | POA: Insufficient documentation

## 2013-08-25 DIAGNOSIS — Y92009 Unspecified place in unspecified non-institutional (private) residence as the place of occurrence of the external cause: Secondary | ICD-10-CM | POA: Insufficient documentation

## 2013-08-25 DIAGNOSIS — J45909 Unspecified asthma, uncomplicated: Secondary | ICD-10-CM | POA: Insufficient documentation

## 2013-08-25 DIAGNOSIS — Y939 Activity, unspecified: Secondary | ICD-10-CM | POA: Insufficient documentation

## 2013-08-25 DIAGNOSIS — W292XXA Contact with other powered household machinery, initial encounter: Secondary | ICD-10-CM | POA: Insufficient documentation

## 2013-08-25 NOTE — Discharge Instructions (Signed)
Please read and follow all provided instructions.  Your diagnoses today include:  1. Fingertip avulsion, initial encounter     Tests performed today include:  Vital signs. See below for your results today.   Medications prescribed:   None  Take any prescribed medications only as directed.   Home care instructions:  Follow any educational materials and wound care instructions contained in this packet.   Keep affected area above the level of your heart when possible to minimize swelling. Wash area gently twice a day with warm soapy water. Do not apply alcohol or hydrogen peroxide. Cover the area if it draining or weeping.   Follow-up instructions: If you do not have a primary care doctor -- see below for referral information.   Return instructions:  Return to the Emergency Department if you have:  Fever  Worsening pain  Worsening swelling of the wound  Pus draining from the wound  Redness of the skin that moves away from the wound, especially if it streaks away from the affected area   Any other emergent concerns  Your vital signs today were: BP 141/81   Pulse 71   Temp(Src) 98.4 F (36.9 C) (Oral)   Resp 18   SpO2 98% If your blood pressure (BP) was elevated above 135/85 this visit, please have this repeated by your doctor within one month. --------------

## 2013-08-25 NOTE — ED Provider Notes (Signed)
CSN: [id number]     Arrival date & time 08/25/13  1925 History   First MD Initiated Contact with Patient 08/25/13 1937     This chart was scribed for non-physician practitioner, Alecia Lemming PA-C working with Leota Jacobsen, MD by Forrestine Him, ED Scribe. This patient was seen in room TR08C/TR08C and the patient's care was started at 8:00 PM.   Chief Complaint  Patient presents with  . Finger Injury   The history is provided by the patient. No language interpreter was used.    HPI Comments: Samantha Delacruz is a 48 y.o. female who presents to the Emergency Department complaining of a finger injury to the right 5th digit that occurred just prior to arrival. Pt states she cut her finger with a slicer while cutting a potato. She now c/o of an avulsion to the 5th digit, along with generalized pain to the area. She reports rinsing the finger with water and applying pressure to the area to stop the bleeding. Pt denies currently being on any blood thinners. Last tetanus < 10 years.    Past Medical History  Diagnosis Date  . Allergy   . Asthma   . Migraine   . GERD (gastroesophageal reflux disease)   . Adult ADHD   . Rosacea    Past Surgical History  Procedure Laterality Date  . Cesarean section  08/15/92    son   Family History  Problem Relation Age of Onset  . Depression Mother   . Diabetes Mother   . Emphysema Mother   . Heart disease Father    History  Substance Use Topics  . Smoking status: Never Smoker   . Smokeless tobacco: Never Used  . Alcohol Use: Yes     Comment: occ   OB History   Grav Para Term Preterm Abortions TAB SAB Ect Mult Living   1 1        1      Review of Systems  Constitutional: Negative for fever, chills and activity change.  Musculoskeletal: Negative for joint swelling.  Skin: Positive for wound (avulsion to tip of  right 5th digit).  Neurological: Negative for weakness and numbness.    Allergies  Codeine and Sulfa antibiotics  Home  Medications   Current Outpatient Rx  Name  Route  Sig  Dispense  Refill  . ibuprofen (ADVIL,MOTRIN) 200 MG tablet   Oral   Take 600 mg by mouth every 6 (six) hours as needed for moderate pain.         Marland Kitchen loratadine (CLARITIN) 10 MG tablet   Oral   Take 10 mg by mouth daily.           . Multiple Vitamin (MULTIVITAMIN) tablet   Oral   Take 1 tablet by mouth daily.         Marland Kitchen albuterol (PROVENTIL HFA;VENTOLIN HFA) 108 (90 BASE) MCG/ACT inhaler   Inhalation   Inhale 2 puffs into the lungs every 4 (four) hours as needed for wheezing or shortness of breath.   1 Inhaler   2    Triage Vitals: BP 141/81  Pulse 71  Temp(Src) 98.4 F (36.9 C) (Oral)  Resp 18  SpO2 98%  Physical Exam  Nursing note and vitals reviewed. Constitutional: She is oriented to person, place, and time. She appears well-developed and well-nourished.  HENT:  Head: Normocephalic and atraumatic.  Eyes: EOM are normal. Pupils are equal, round, and reactive to light.  Neck: Normal range of motion. Neck  supple.  Cardiovascular: Normal rate.  Exam reveals no decreased pulses.   Pulmonary/Chest: Effort normal.  Musculoskeletal: Normal range of motion. She exhibits no edema and no tenderness.  Full ROM of involved digit.   Neurological: She is alert and oriented to person, place, and time. No sensory deficit.  Motor, sensation, and vascular distal to the injury is fully intact.   Skin: Skin is warm and dry.  Minor avulsion to tip of R 5th digit extending only into subdermal tissue. No exposed bone. Wound base is clean and actively oozing.   Psychiatric: She has a normal mood and affect. Her behavior is normal.    ED Course  Procedures (including critical care time)  DIAGNOSTIC STUDIES: Oxygen Saturation is 98% on RA, Normal by my interpretation.    COORDINATION OF CARE: 8:00 PM-Discussed treatment plan with pt at bedside and pt agreed to plan.     Labs Review Labs Reviewed - No data to display Imaging  Review No results found.  EKG Interpretation   None      Patient seen and examined.  Vital signs reviewed and are as follows: Filed Vitals:   08/25/13 2050  BP: 158/89  Pulse: 68  Temp: 97 F (36.1 C)  Resp: 18   Tourniquet applied for approximately 3 minutes to arrest bleeding.   Wound cleaned gently by nurse prior and by myself.   Quick clot powder was applied to wound and wound was immediately dressed with assistance of nurse. Patient counseled to leave dressing on overnight and remove, gently clean tomorrow morning.   Good hemostasis achieved.   Patient counseled on wound care. Patient was urged to return to the Emergency Department urgently with worsening pain, swelling, expanding erythema especially if it streaks away from the affected area, fever, or if they have any other concerns. Patient verbalized understanding.      MDM   1. Fingertip avulsion, initial encounter    Small fingertip avulsion. Measures taken to achieve hemostasis. Finger neurovascularly intact with normal motor function.   I personally performed the services described in this documentation, which was scribed in my presence. The recorded information has been reviewed and is accurate.   Carlisle Cater, PA-C 08/27/13 1504

## 2013-08-25 NOTE — ED Notes (Signed)
Pt in after cutting the tip of her right pinky finger off, no nail involvement, dressing applied in triage, no distress noted

## 2013-08-26 ENCOUNTER — Telehealth: Payer: Self-pay | Admitting: Family Medicine

## 2013-08-26 NOTE — Telephone Encounter (Signed)
Spoke with patient and instructed her on how to clean her wound.  An appointment made if needed.

## 2013-08-26 NOTE — Telephone Encounter (Signed)
Pt sliced off the tip of her pinky finger last nite. Went to ED. Pt wants to come by and let someone look at it. Pt used to regular guaze (not non stick) to re dress the wound. Now she is scared to pull of dressing! Pls advise

## 2013-08-27 ENCOUNTER — Ambulatory Visit: Payer: BC Managed Care – PPO | Admitting: Family Medicine

## 2013-08-28 NOTE — ED Provider Notes (Signed)
Medical screening examination/treatment/procedure(s) were performed by non-physician practitioner and as supervising physician I was immediately available for consultation/collaboration.  Leota Jacobsen, MD 08/28/13 [phone]

## 2013-09-26 ENCOUNTER — Encounter: Payer: Self-pay | Admitting: Family Medicine

## 2013-09-26 ENCOUNTER — Ambulatory Visit (INDEPENDENT_AMBULATORY_CARE_PROVIDER_SITE_OTHER): Payer: BC Managed Care – PPO | Admitting: Family Medicine

## 2013-09-26 VITALS — BP 120/80 | Temp 98.6°F | Wt 185.0 lb

## 2013-09-26 DIAGNOSIS — J309 Allergic rhinitis, unspecified: Secondary | ICD-10-CM | POA: Insufficient documentation

## 2013-09-26 DIAGNOSIS — J3089 Other allergic rhinitis: Secondary | ICD-10-CM | POA: Insufficient documentation

## 2013-09-26 MED ORDER — PREDNISONE 20 MG PO TABS: ORAL_TABLET | ORAL | Status: DC

## 2013-09-26 NOTE — Progress Notes (Signed)
   Subjective:    Patient ID: Samantha Delacruz, female    DOB: 05/22/1966, 48 y.o.   MRN: [id number]  Samantha Delacruz is a 48 year old female who comes in with today with a three-week history of head congestion postnasal drip  No asthma. She's had a history of allergic rhinitis and asthma in the past. She's been taking over-the-counter medications to no avail   Review of Systems Review of systems negative    Objective:   Physical Exam Well-developed well-nourished female no acute distress vital signs stable she is afebrile HEENT negative except for the septum was in the midline with 4+ nasal edema neck was supple no adenopathy       Assessment & Plan:  Allergic rhinitis plan see orders prednisone burst and taper

## 2013-09-26 NOTE — Progress Notes (Signed)
Pre visit review using our clinic review tool, if applicable. No additional management support is needed unless otherwise documented below in the visit note. 

## 2013-09-26 NOTE — Patient Instructions (Signed)
Drink lots of water  Vaporizer  Prednisone 20 mg,,,,,,,, 2 tabs x3 days or until you feel a lot better then taper as outlined  Afrin nasal spray...Marland KitchenMarland KitchenMarland Kitchen 1 shot up each nostril at bedtime x5 nights then stop

## 2013-10-29 ENCOUNTER — Encounter: Payer: Self-pay | Admitting: Gynecology

## 2013-10-29 ENCOUNTER — Ambulatory Visit (INDEPENDENT_AMBULATORY_CARE_PROVIDER_SITE_OTHER): Payer: BC Managed Care – PPO | Admitting: Gynecology

## 2013-10-29 VITALS — BP 120/76 | Ht 65.0 in | Wt 187.0 lb

## 2013-10-29 DIAGNOSIS — Z01419 Encounter for gynecological examination (general) (routine) without abnormal findings: Secondary | ICD-10-CM

## 2013-10-29 DIAGNOSIS — N920 Excessive and frequent menstruation with regular cycle: Secondary | ICD-10-CM

## 2013-10-29 MED ORDER — METRONIDAZOLE 500 MG PO TABS: 500.0000 mg | ORAL_TABLET | Freq: Two times a day (BID) | ORAL | Status: DC

## 2013-10-29 NOTE — Progress Notes (Signed)
Samantha Delacruz 22-Nov-1965 [id number]        47 y.o.  G1P1 for annual exam.  Several issues discussed below.  Past medical history,surgical history, problem list, medications, allergies, family history and social history were all reviewed and documented in the EPIC chart.  ROS:  Performed and pertinent positives and negatives are included in the history, assessment and plan .  Exam: Kim assistant Filed Vitals:   10/29/13 1526  BP: 120/76  Height: 5\' 5"  (1.651 m)  Weight: 187 lb (84.823 kg)   General appearance  Normal Skin grossly normal Head/Neck normal with no cervical or supraclavicular adenopathy thyroid normal Lungs  clear Cardiac RR, without RMG Abdominal  soft, nontender, without masses, organomegaly or hernia Breasts  examined lying and sitting without masses, retractions, discharge or axillary adenopathy. Pelvic  Ext/BUS/vagina normal  Cervix normal in appearance. Does deviate posteriorly requiring manipulation with swab to visualize.  Uterus anteverted, normal size, shape and contour, midline and mobile nontender   Adnexa  Without masses or tenderness    Anus and perineum  Normal   Rectovaginal  Normal sphincter tone without palpated masses or tenderness.    Assessment/Plan:  48 y.o. G1P1 female for annual exam regular menses, abstinence birth control..   1. Menorrhagia. Menses continue to be heavy but regular. We have evaluated her last year to include negative sonohysterogram and biopsy. Blood count and TSH were normal. We reviewed options for management to include hormonal manipulation, Mirena IUD, endometrial ablation and hysterectomy. Patient has decided to do nothing but monitor. She'll followup if she changes her mind and wants to try something different. 2. Contraception. Patient using abstinence and does not plan to change this. Does not want birth control. 3. Pap smear 2013. No Pap smear done today. No history of significant abnormal Pap smears. We'll plan  repeat Pap smear next year at 3 year interval. 4. Mammography 05/2012. I reminded her that she is over due and needs to call to schedule and she agrees to do so. SBE monthly reviewed. 5. Health maintenance. No blood work done as she sees Dr. Sherren Mocha routinely for healthcare. Followup one year, sooner as needed.   Note: This document was prepared with digital dictation and possible smart phrase technology. Any transcriptional errors that result from this process are unintentional.   Anastasio Auerbach MD, 4:03 PM 10/29/2013

## 2013-10-29 NOTE — Patient Instructions (Signed)
Followup in one year, sooner as needed. Call to schedule your mammogram.  Health Maintenance, Female A healthy lifestyle and preventative care can promote health and wellness.  Maintain regular health, dental, and eye exams.  Eat a healthy diet. Foods like vegetables, fruits, whole grains, low-fat dairy products, and lean protein foods contain the nutrients you need without too many calories. Decrease your intake of foods high in solid fats, added sugars, and salt. Get information about a proper diet from your caregiver, if necessary.  Regular physical exercise is one of the most important things you can do for your health. Most adults should get at least 150 minutes of moderate-intensity exercise (any activity that increases your heart rate and causes you to sweat) each week. In addition, most adults need muscle-strengthening exercises on 2 or more days a week.   Maintain a healthy weight. The body mass index (BMI) is a screening tool to identify possible weight problems. It provides an estimate of body fat based on height and weight. Your caregiver can help determine your BMI, and can help you achieve or maintain a healthy weight. For adults 20 years and older:  A BMI below 18.5 is considered underweight.  A BMI of 18.5 to 24.9 is normal.  A BMI of 25 to 29.9 is considered overweight.  A BMI of 30 and above is considered obese.  Maintain normal blood lipids and cholesterol by exercising and minimizing your intake of saturated fat. Eat a balanced diet with plenty of fruits and vegetables. Blood tests for lipids and cholesterol should begin at age 38 and be repeated every 5 years. If your lipid or cholesterol levels are high, you are over 50, or you are a high risk for heart disease, you may need your cholesterol levels checked more frequently.Ongoing high lipid and cholesterol levels should be treated with medicines if diet and exercise are not effective.  If you smoke, find out from your  caregiver how to quit. If you do not use tobacco, do not start.  Lung cancer screening is recommended for adults aged 51 80 years who are at high risk for developing lung cancer because of a history of smoking. Yearly low-dose computed tomography (CT) is recommended for people who have at least a 30-pack-year history of smoking and are a current smoker or have quit within the past 15 years. A pack year of smoking is smoking an average of 1 pack of cigarettes a day for 1 year (for example: 1 pack a day for 30 years or 2 packs a day for 15 years). Yearly screening should continue until the smoker has stopped smoking for at least 15 years. Yearly screening should also be stopped for people who develop a health problem that would prevent them from having lung cancer treatment.  If you are pregnant, do not drink alcohol. If you are breastfeeding, be very cautious about drinking alcohol. If you are not pregnant and choose to drink alcohol, do not exceed 1 drink per day. One drink is considered to be 12 ounces (355 mL) of beer, 5 ounces (148 mL) of wine, or 1.5 ounces (44 mL) of liquor.  Avoid use of street drugs. Do not share needles with anyone. Ask for help if you need support or instructions about stopping the use of drugs.  High blood pressure causes heart disease and increases the risk of stroke. Blood pressure should be checked at least every 1 to 2 years. Ongoing high blood pressure should be treated with medicines, if  weight loss and exercise are not effective.  If you are 19 to 49 years old, ask your caregiver if you should take aspirin to prevent strokes.  Diabetes screening involves taking a blood sample to check your fasting blood sugar level. This should be done once every 3 years, after age 57, if you are within normal weight and without risk factors for diabetes. Testing should be considered at a younger age or be carried out more frequently if you are overweight and have at least 1 risk factor  for diabetes.  Breast cancer screening is essential preventative care for women. You should practice "breast self-awareness." This means understanding the normal appearance and feel of your breasts and may include breast self-examination. Any changes detected, no matter how small, should be reported to a caregiver. Women in their 8s and 30s should have a clinical breast exam (CBE) by a caregiver as part of a regular health exam every 1 to 3 years. After age 49, women should have a CBE every year. Starting at age 64, women should consider having a mammogram (breast X-ray) every year. Women who have a family history of breast cancer should talk to their caregiver about genetic screening. Women at a high risk of breast cancer should talk to their caregiver about having an MRI and a mammogram every year.  Breast cancer gene (BRCA)-related cancer risk assessment is recommended for women who have family members with BRCA-related cancers. BRCA-related cancers include breast, ovarian, tubal, and peritoneal cancers. Having family members with these cancers may be associated with an increased risk for harmful changes (mutations) in the breast cancer genes BRCA1 and BRCA2. Results of the assessment will determine the need for genetic counseling and BRCA1 and BRCA2 testing.  The Pap test is a screening test for cervical cancer. Women should have a Pap test starting at age 89. Between ages 14 and 56, Pap tests should be repeated every 2 years. Beginning at age 78, you should have a Pap test every 3 years as long as the past 3 Pap tests have been normal. If you had a hysterectomy for a problem that was not cancer or a condition that could lead to cancer, then you no longer need Pap tests. If you are between ages 59 and 68, and you have had normal Pap tests going back 10 years, you no longer need Pap tests. If you have had past treatment for cervical cancer or a condition that could lead to cancer, you need Pap tests and  screening for cancer for at least 20 years after your treatment. If Pap tests have been discontinued, risk factors (such as a new sexual partner) need to be reassessed to determine if screening should be resumed. Some women have medical problems that increase the chance of getting cervical cancer. In these cases, your caregiver may recommend more frequent screening and Pap tests.  The human papillomavirus (HPV) test is an additional test that may be used for cervical cancer screening. The HPV test looks for the virus that can cause the cell changes on the cervix. The cells collected during the Pap test can be tested for HPV. The HPV test could be used to screen women aged 57 years and older, and should be used in women of any age who have unclear Pap test results. After the age of 14, women should have HPV testing at the same frequency as a Pap test.  Colorectal cancer can be detected and often prevented. Most routine colorectal cancer screening begins  at the age of 56 and continues through age 26. However, your caregiver may recommend screening at an earlier age if you have risk factors for colon cancer. On a yearly basis, your caregiver may provide home test kits to check for hidden blood in the stool. Use of a small camera at the end of a tube, to directly examine the colon (sigmoidoscopy or colonoscopy), can detect the earliest forms of colorectal cancer. Talk to your caregiver about this at age 36, when routine screening begins. Direct examination of the colon should be repeated every 5 to 10 years through age 4, unless early forms of pre-cancerous polyps or small growths are found.  Hepatitis C blood testing is recommended for all people born from [birth year] through 1966 and any individual with known risks for hepatitis C.  Practice safe sex. Use condoms and avoid high-risk sexual practices to reduce the spread of sexually transmitted infections (STIs). Sexually active women aged 66 and younger should be  checked for Chlamydia, which is a common sexually transmitted infection. Older women with new or multiple partners should also be tested for Chlamydia. Testing for other STIs is recommended if you are sexually active and at increased risk.  Osteoporosis is a disease in which the bones lose minerals and strength with aging. This can result in serious bone fractures. The risk of osteoporosis can be identified using a bone density scan. Women ages 47 and over and women at risk for fractures or osteoporosis should discuss screening with their caregivers. Ask your caregiver whether you should be taking a calcium supplement or vitamin D to reduce the rate of osteoporosis.  Menopause can be associated with physical symptoms and risks. Hormone replacement therapy is available to decrease symptoms and risks. You should talk to your caregiver about whether hormone replacement therapy is right for you.  Use sunscreen. Apply sunscreen liberally and repeatedly throughout the day. You should seek shade when your shadow is shorter than you. Protect yourself by wearing long sleeves, pants, a wide-brimmed hat, and sunglasses year round, whenever you are outdoors.  Notify your caregiver of new moles or changes in moles, especially if there is a change in shape or color. Also notify your caregiver if a mole is larger than the size of a pencil eraser.  Stay current with your immunizations. Document Released: 02/14/2011 Document Revised: 11/26/2012 Document Reviewed: 02/14/2011 [facility] Patient Information 2014 [employer].

## 2013-10-30 LAB — URINALYSIS W MICROSCOPIC + REFLEX CULTURE
BACTERIA UA: NONE SEEN
Bilirubin Urine: NEGATIVE
CRYSTALS: NONE SEEN
Casts: NONE SEEN
Glucose, UA: NEGATIVE mg/dL
Hgb urine dipstick: NEGATIVE
Ketones, ur: NEGATIVE mg/dL
Leukocytes, UA: NEGATIVE
NITRITE: NEGATIVE
Protein, ur: NEGATIVE mg/dL
SPECIFIC GRAVITY, URINE: 1.021 (ref 1.005–1.030)
SQUAMOUS EPITHELIAL / LPF: NONE SEEN
UROBILINOGEN UA: 0.2 mg/dL (ref 0.0–1.0)
pH: 6 (ref 5.0–8.0)

## 2013-10-31 ENCOUNTER — Ambulatory Visit: Payer: BC Managed Care – PPO | Admitting: Family Medicine

## 2013-12-03 ENCOUNTER — Other Ambulatory Visit (INDEPENDENT_AMBULATORY_CARE_PROVIDER_SITE_OTHER): Payer: BC Managed Care – PPO

## 2013-12-03 DIAGNOSIS — Z0000 Encounter for general adult medical examination without abnormal findings: Secondary | ICD-10-CM

## 2013-12-03 LAB — HEPATIC FUNCTION PANEL
ALK PHOS: 53 U/L (ref 39–117)
ALT: 12 U/L (ref 0–35)
AST: 15 U/L (ref 0–37)
Albumin: 3.8 g/dL (ref 3.5–5.2)
BILIRUBIN DIRECT: 0.1 mg/dL (ref 0.0–0.3)
BILIRUBIN TOTAL: 0.6 mg/dL (ref 0.3–1.2)
Total Protein: 7 g/dL (ref 6.0–8.3)

## 2013-12-03 LAB — CBC WITH DIFFERENTIAL/PLATELET
BASOS PCT: 0.4 % (ref 0.0–3.0)
Basophils Absolute: 0 10*3/uL (ref 0.0–0.1)
EOS ABS: 0.2 10*3/uL (ref 0.0–0.7)
Eosinophils Relative: 2.2 % (ref 0.0–5.0)
HCT: 39.5 % (ref 36.0–46.0)
Hemoglobin: 13.3 g/dL (ref 12.0–15.0)
Lymphocytes Relative: 28.9 % (ref 12.0–46.0)
Lymphs Abs: 2.4 10*3/uL (ref 0.7–4.0)
MCHC: 33.6 g/dL (ref 30.0–36.0)
MCV: 87.4 fl (ref 78.0–100.0)
MONO ABS: 0.6 10*3/uL (ref 0.1–1.0)
Monocytes Relative: 7 % (ref 3.0–12.0)
NEUTROS ABS: 5.2 10*3/uL (ref 1.4–7.7)
NEUTROS PCT: 61.5 % (ref 43.0–77.0)
Platelets: 320 10*3/uL (ref 150.0–400.0)
RBC: 4.51 Mil/uL (ref 3.87–5.11)
RDW: 13.3 % (ref 11.5–14.6)
WBC: 8.4 10*3/uL (ref 4.5–10.5)

## 2013-12-03 LAB — LIPID PANEL
CHOL/HDL RATIO: 3
Cholesterol: 158 mg/dL (ref 0–200)
HDL: 51.7 mg/dL (ref >39.00)
LDL Cholesterol: 100 mg/dL — ABNORMAL HIGH (ref 0–99)
TRIGLYCERIDES: 34 mg/dL (ref 0.0–149.0)
VLDL: 6.8 mg/dL (ref 0.0–40.0)

## 2013-12-03 LAB — BASIC METABOLIC PANEL
BUN: 12 mg/dL (ref 6–23)
CHLORIDE: 104 meq/L (ref 96–112)
CO2: 27 meq/L (ref 19–32)
CREATININE: 0.6 mg/dL (ref 0.4–1.2)
Calcium: 8.8 mg/dL (ref 8.4–10.5)
GFR: 112.9 mL/min (ref >60.00)
Glucose, Bld: 76 mg/dL (ref 70–99)
Potassium: 3.7 mEq/L (ref 3.5–5.1)
Sodium: 139 mEq/L (ref 135–145)

## 2013-12-03 LAB — TSH: TSH: 1.3 u[IU]/mL (ref 0.35–5.50)

## 2013-12-10 ENCOUNTER — Ambulatory Visit (INDEPENDENT_AMBULATORY_CARE_PROVIDER_SITE_OTHER): Payer: BC Managed Care – PPO | Admitting: Family Medicine

## 2013-12-10 ENCOUNTER — Encounter: Payer: Self-pay | Admitting: Family Medicine

## 2013-12-10 VITALS — BP 140/90 | Temp 97.3°F | Ht 65.5 in | Wt 190.0 lb

## 2013-12-10 DIAGNOSIS — J309 Allergic rhinitis, unspecified: Secondary | ICD-10-CM

## 2013-12-10 DIAGNOSIS — E663 Overweight: Secondary | ICD-10-CM

## 2013-12-10 DIAGNOSIS — K625 Hemorrhage of anus and rectum: Secondary | ICD-10-CM

## 2013-12-10 DIAGNOSIS — N6019 Diffuse cystic mastopathy of unspecified breast: Secondary | ICD-10-CM

## 2013-12-10 DIAGNOSIS — J45901 Unspecified asthma with (acute) exacerbation: Secondary | ICD-10-CM

## 2013-12-10 DIAGNOSIS — Z0000 Encounter for general adult medical examination without abnormal findings: Secondary | ICD-10-CM

## 2013-12-10 MED ORDER — FLUTICASONE-SALMETEROL 250-50 MCG/DOSE IN AEPB: 1.0000 | INHALATION_SPRAY | Freq: Two times a day (BID) | RESPIRATORY_TRACT | Status: DC

## 2013-12-10 MED ORDER — ALBUTEROL SULFATE HFA 108 (90 BASE) MCG/ACT IN AERS: 2.0000 | INHALATION_SPRAY | RESPIRATORY_TRACT | Status: DC | PRN

## 2013-12-10 NOTE — Progress Notes (Signed)
   Subjective:    Patient ID: Samantha Delacruz, female    DOB: 1965-10-20, 48 y.o.   MRN: [id number]  [place] is a 47 year old married female nonsmoker who comes in today for general physical examination  We saw her last year she did some bright red rectal bleeding. Recommend she had a GI evaluation which he has not gone to yet. In the meantime she said recurrent episodes of bright red rectal bleeding. No change in bowel habits. Again we will put her in for a GI consult  She's a history of asthma as well as be given after twice a day but showing takes it when necessary again advise to take one puff twice a day and albuterol when necessary  She takes Claritin for allergic rhinitis  She's had trouble with her right shoulder recently she's been orthopedics in the past for that.  Her last mammogram 2-1/2 years ago recommend followup in October 2014 but she's not gone yet. The recommend mammography and ultrasound because the lesion her left breast.  LMP week ago Paps by GYN  Vaccinations updated by Apolonio Schneiders    Review of Systems  Constitutional: Negative.   HENT: Negative.   Eyes: Negative.   Respiratory: Negative.   Cardiovascular: Negative.   Gastrointestinal: Negative.   Genitourinary: Negative.   Musculoskeletal: Negative.   Neurological: Negative.   Psychiatric/Behavioral: Negative.        Objective:   Physical Exam  Constitutional: She appears well-developed and well-nourished.  HENT:  Head: Normocephalic and atraumatic.  Right Ear: External ear normal.  Left Ear: External ear normal.  Nose: Nose normal.  Mouth/Throat: Oropharynx is clear and moist.  Eyes: EOM are normal. Pupils are equal, round, and reactive to light.  Neck: Normal range of motion. Neck supple. No thyromegaly present.  Cardiovascular: Normal rate, regular rhythm, normal heart sounds and intact distal pulses.  Exam reveals no gallop and no friction rub.   No murmur heard. Pulmonary/Chest: Effort  normal and breath sounds normal.  Abdominal: Soft. Bowel sounds are normal. She exhibits no distension and no mass. There is no tenderness. There is no rebound.  Genitourinary:  Bilateral breast exam normal except there is a small rubbery movable lesion left breast 3:00 about 6 cm from the nipple again soft rubbery movable  And GYN care by gynecologist  History rectal bleeding refer to GI  Musculoskeletal: Normal range of motion.  Lymphadenopathy:    She has no cervical adenopathy.  Neurological: She is alert. She has normal reflexes. No cranial nerve deficit. She exhibits normal muscle tone. Coordination normal.  Skin: Skin is warm and dry.  Psychiatric: She has a normal mood and affect. Her behavior is normal. Judgment and thought content normal.          Assessment & Plan:  Asthma Advair 1 puff twice a day albuterol when necessary  History of recurrent bright rectal bleeding GI consult ASAP  Asthma Advair 1 puff twice a day daily albuterol when necessary  Cystic lesion left breast followup mammography and ultrasound this spring

## 2013-12-10 NOTE — Patient Instructions (Signed)
Advair...........Marland Kitchen 1 puff twice daily  Albuterol when necessary  I called to set you up an appointment in GI  Call and get set up for a mammogram at the [facility].  Return in one year sooner if any problem.

## 2013-12-10 NOTE — Progress Notes (Signed)
Pre visit review using our clinic review tool, if applicable. No additional management support is needed unless otherwise documented below in the visit note. 

## 2013-12-11 ENCOUNTER — Encounter: Payer: Self-pay | Admitting: Internal Medicine

## 2013-12-11 LAB — POCT URINALYSIS DIPSTICK
BILIRUBIN UA: NEGATIVE
GLUCOSE UA: NEGATIVE
KETONES UA: NEGATIVE
Nitrite, UA: NEGATIVE
Protein, UA: NEGATIVE
Urobilinogen, UA: 0.2
pH, UA: 5

## 2013-12-11 NOTE — Addendum Note (Signed)
Addended by: Joyce Gross R on: 12/11/2013 09:21 AM   Modules accepted: Orders

## 2013-12-16 ENCOUNTER — Other Ambulatory Visit: Payer: Self-pay | Admitting: Family Medicine

## 2013-12-16 ENCOUNTER — Other Ambulatory Visit: Payer: Self-pay

## 2013-12-16 ENCOUNTER — Other Ambulatory Visit: Payer: Self-pay | Admitting: Gynecology

## 2013-12-16 DIAGNOSIS — N63 Unspecified lump in unspecified breast: Secondary | ICD-10-CM

## 2013-12-16 DIAGNOSIS — N649 Disorder of breast, unspecified: Secondary | ICD-10-CM

## 2013-12-25 ENCOUNTER — Other Ambulatory Visit: Payer: BC Managed Care – PPO

## 2013-12-26 ENCOUNTER — Ambulatory Visit
Admission: RE | Admit: 2013-12-26 | Discharge: 2013-12-26 | Disposition: A | Discharge status: Home / Self Care | Payer: BC Managed Care – PPO | Source: Ambulatory Visit | Attending: Family Medicine | Admitting: Family Medicine

## 2013-12-26 ENCOUNTER — Encounter (INDEPENDENT_AMBULATORY_CARE_PROVIDER_SITE_OTHER): Payer: Self-pay

## 2013-12-26 DIAGNOSIS — N649 Disorder of breast, unspecified: Secondary | ICD-10-CM

## 2014-02-05 ENCOUNTER — Ambulatory Visit (INDEPENDENT_AMBULATORY_CARE_PROVIDER_SITE_OTHER): Payer: BC Managed Care – PPO | Admitting: Internal Medicine

## 2014-02-05 ENCOUNTER — Encounter: Payer: Self-pay | Admitting: Internal Medicine

## 2014-02-05 VITALS — BP 116/72 | HR 80 | Ht 65.0 in | Wt 188.0 lb

## 2014-02-05 DIAGNOSIS — K648 Other hemorrhoids: Secondary | ICD-10-CM

## 2014-02-05 DIAGNOSIS — Z91040 Latex allergy status: Secondary | ICD-10-CM | POA: Insufficient documentation

## 2014-02-05 HISTORY — DX: Other hemorrhoids: K64.8

## 2014-02-05 NOTE — Patient Instructions (Signed)
HEMORRHOID BANDING PROCEDURE    FOLLOW-UP CARE   1. The procedure you have had should have been relatively painless since the banding of the area involved does not have nerve endings and there is no pain sensation.  The rubber band cuts off the blood supply to the hemorrhoid and the band may fall off as soon as 48 hours after the banding (the band may occasionally be seen in the toilet bowl following a bowel movement). You may notice a temporary feeling of fullness in the rectum which should respond adequately to plain Tylenol or Motrin.  2. Following the banding, avoid strenuous exercise that evening and resume full activity the next day.  A sitz bath (soaking in a warm tub) or bidet is soothing, and can be useful for cleansing the area after bowel movements.     3. To avoid constipation, take two tablespoons of natural wheat bran, natural oat bran, flax, Benefiber or any over the counter fiber supplement and increase your water intake to 7-8 glasses daily.    4. Unless you have been prescribed anorectal medication, do not put anything inside your rectum for two weeks: No suppositories, enemas, fingers, etc.  5. Occasionally, you may have more bleeding than usual after the banding procedure.  This is often from the untreated hemorrhoids rather than the treated one.  Don't be concerned if there is a tablespoon or so of blood.  If there is more blood than this, lie flat with your bottom higher than your head and apply an ice pack to the area. If the bleeding does not stop within a half an hour or if you feel faint, call our office at [phone] or go to the emergency room.  6. Problems are not common; however, if there is a substantial amount of bleeding, severe pain, chills, fever or difficulty passing urine (very rare) or other problems, you should call us at [phone] or report to the nearest emergency room.  7. Do not stay seated continuously for more than 2-3 hours for a day or two  after the procedure.  Tighten your buttock muscles 10-15 times every two hours and take 10-15 deep breaths every 1-2 hours.  Do not spend more than a few minutes on the toilet if you cannot empty your bowel; instead re-visit the toilet at a later time.    Today we are giving you a Benefiber handout to read and follow.  We will see you at your next visit.   I appreciate the opportunity to care for you.

## 2014-02-05 NOTE — Assessment & Plan Note (Addendum)
RP banded Benefiber 2 tbsp/day RTC 2-4 weeks

## 2014-02-05 NOTE — Progress Notes (Signed)
Subjective:    Patient ID: Samantha Delacruz, female    DOB: April 17, 1966, 48 y.o.   MRN: [id number]  HPI The patient is a very nice middle-aged woman, I also care for her husband, who has had a 2 year history of intermittent rectal bleeding. Over time she's had more frequent problems though infrequently seems to be associated with her menstrual cycle. She has painless or seepage of blood and mucus in some time she has had a fair amount of blood on the toilet [place]. She denies significant straining to stool though she has suffered with chronic constipation in the past going up to 2 weeks without defecation. She has been exercising more and she has been moving her bowels better moving them every 1-3 days. Eyes any change in stool caliber or anything like that. She has not yet had a screening colonoscopy but she is not yet 49, she will be next year. Allergies  Allergen Reactions  . Latex Rash  . Codeine Hives  . Sulfa Antibiotics     hives   Outpatient Prescriptions Prior to Visit  Medication Sig Dispense Refill  . albuterol (PROVENTIL HFA;VENTOLIN HFA) 108 (90 BASE) MCG/ACT inhaler Inhale 2 puffs into the lungs every 4 (four) hours as needed for wheezing or shortness of breath.  1 Inhaler  2  . Fluticasone-Salmeterol (ADVAIR DISKUS) 250-50 MCG/DOSE AEPB Inhale 1 puff into the lungs 2 (two) times daily.  60 each  11  . ibuprofen (ADVIL,MOTRIN) 200 MG tablet Take 600 mg by mouth every 6 (six) hours as needed for moderate pain.      Marland Kitchen loratadine (CLARITIN) 10 MG tablet Take 10 mg by mouth daily.        . Multiple Vitamin (MULTIVITAMIN) tablet Take 1 tablet by mouth daily.       No facility-administered medications prior to visit.   Past Medical History  Diagnosis Date  . Allergy   . Asthma   . Migraine   . Adult ADHD   . Rosacea   . Vertigo   . Hemorrhoids, internal, with bleeding and mucous discharge - Grade 1 02/05/2014   Past Surgical History  Procedure Laterality Date  .  Cesarean section  08/15/92    son   History   Social History  . Marital Status: Married    Spouse Name: N/A    Number of Children: 1  . Years of Education: N/A   Occupational History  .  [place] History Main Topics  . Smoking status: Never Smoker   . Smokeless tobacco: Never Used  . Alcohol Use: Yes     Comment: occ  . Drug Use: No  . 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 Depression Mother   . Diabetes Mother   . Emphysema Mother   . Heart disease Father     Review of Systems As per history of present illness, all other review of systems are negative    Objective:   Physical Exam General:  Well-developed, well-nourished and in no acute distress Eyes:  anicteric. ENT:   Mouth and posterior pharynx free of lesions.  Neck:   supple w/o thyromegaly  or mass.  Lungs: Clear to auscultation bilaterally. Heart:  S1S2, no rubs, murmurs, gallops. Abdomen:  soft, non-tender, no hepatosplenomegaly, hernia, or mass and BS+.  Rectal:  Patti [place], [place] present.   Anoderm inspection revealed no abnormalities Anal wink was absent Digital exam revealed normal resting tone and voluntary squeeze. No mass or rectocele present. Simulated defecation with valsalva revealed appropriate abdominal contraction and descent.     Lymph:  no cervical or supraclavicular adenopathy. Extremities:   no edema Skin   no rash. Neuro:  A&O x 3.  Psych:  appropriate mood and  Affect.  PROCEDURE NOTE: The patient presents with symptomatic grade 1  hemorrhoids, requesting rubber band ligation of his/her hemorrhoidal disease.  All risks, benefits and alternative forms of therapy were described and informed consent was obtained.  In the Left Lateral Decubitus  position anoscopic examination revealed grade 1 hemorrhoids in the all position(s).  The anorectum was pre-medicated with  The decision was made to band the RP internal hemorrhoid, and the [place] was used to perform band ligation without complication.  LATEX FREE BAND USED Digital anorectal examination was then performed to assure proper positioning of the band, and to adjust the banded tissue as required.  The patient was discharged home without pain or other issues.  Dietary and behavioral recommendations were given and along with follow-up instructions.     The following adjunctive treatments were recommended:  Benefiber  The patient will return in 2-4 weeks for  follow-up and possible additional banding as required. No complications were encountered and the patient tolerated the procedure well.     Assessment & Plan:  Hemorrhoids, internal, with bleeding and mucous discharge - Grade 1 RP banded Benefiber 2 tbsp/day RTC 2-4 weeks  Latex allergy Use nonlatex hemorrhoidal bands    She will need a colonoscopy for colon cancer screening. She understand there could be other causes of bleeding but we both think that the hemorrhoids are the most likely cause at this point. She does not want to pursue a colonoscopy right now. Her CBC was normal 2 months ago in a year ago. The chronicity of the symptoms and these findings point to their benign etiology overall. However should she fail to respond to hemorrhoidal banding, colonoscopy sooner than age 42 will be appropriate.  I appreciate the opportunity to care for this patient. CC: TODD,JEFFREY ALLEN, MD

## 2014-02-05 NOTE — Assessment & Plan Note (Signed)
Use nonlatex hemorrhoidal bands

## 2014-02-19 ENCOUNTER — Ambulatory Visit (INDEPENDENT_AMBULATORY_CARE_PROVIDER_SITE_OTHER): Payer: BC Managed Care – PPO | Admitting: Physician Assistant

## 2014-02-19 ENCOUNTER — Telehealth: Payer: Self-pay | Admitting: Family Medicine

## 2014-02-19 ENCOUNTER — Encounter: Payer: Self-pay | Admitting: Physician Assistant

## 2014-02-19 VITALS — BP 110/78 | HR 66 | Temp 98.6°F | Resp 18 | Wt 192.0 lb

## 2014-02-19 DIAGNOSIS — H109 Unspecified conjunctivitis: Secondary | ICD-10-CM

## 2014-02-19 MED ORDER — POLYMYXIN B-TRIMETHOPRIM 10000-0.1 UNIT/ML-% OP SOLN: OPHTHALMIC | Status: DC

## 2014-02-19 NOTE — Telephone Encounter (Signed)
Called and advised pt that Dr. Sherren Mocha was out of the office until next week. Advised that pt needed to be seen for evaluation.  Pt verbalized understanding and appt made for today at 1 pm with Kela Millin.

## 2014-02-19 NOTE — Progress Notes (Signed)
Pre visit review using our clinic review tool, if applicable. No additional management support is needed unless otherwise documented below in the visit note. 

## 2014-02-19 NOTE — Patient Instructions (Signed)
Trimethoprim polymyxin B eyedrops: 1-2 drops into the affected eye 4 times daily for 5 days. Make sure to complete a full 5 days of treatment to prevent recurrent infection.  If emergency symptoms discussed during visit developed, seek medical attention immediately.  Followup as needed, or for worsening or persistent symptoms despite treatment.   Conjunctivitis Conjunctivitis is commonly called "pink eye." Conjunctivitis can be caused by bacterial or viral infection, allergies, or injuries. There is usually redness of the lining of the eye, itching, discomfort, and sometimes discharge. There may be deposits of matter along the eyelids. A viral infection usually causes a watery discharge, while a bacterial infection causes a yellowish, thick discharge. Pink eye is very contagious and spreads by direct contact. You may be given antibiotic eyedrops as part of your treatment. Before using your eye medicine, remove all drainage from the eye by washing gently with warm water and cotton balls. Continue to use the medication until you have awakened 2 mornings in a row without discharge from the eye. Do not rub your eye. This increases the irritation and helps spread infection. Use separate towels from other household members. Wash your hands with soap and water before and after touching your eyes. Use cold compresses to reduce pain and sunglasses to relieve irritation from light. Do not wear contact lenses or wear eye makeup until the infection is gone. SEEK MEDICAL CARE IF:   Your symptoms are not better after 3 days of treatment.  You have increased pain or trouble seeing.  The outer eyelids become very red or swollen. Document Released: 09/08/2004 Document Revised: 10/24/2011 Document Reviewed: 08/01/2005 [facility] Patient Information 2015 [place], [place]. This information is not intended to replace advice given to you by your health care provider. Make sure you discuss any questions you have with your  health care provider.

## 2014-02-19 NOTE — Telephone Encounter (Signed)
Patient Information:  Caller Name: Samantha Delacruz  Phone: [phone]  Patient: Samantha Delacruz  Gender: Female  DOB: 11/20/1965  Age: 48 Years  PCP: Stevie Kern [facility])  Pregnant: No  Office Follow Up:  Does the office need to follow up with this patient?: Yes  Instructions For The Office: see notes  RN Note:  Pt is requesting prescription drops for pink eye. Triage RN unable to call in script per standing orders at this time b/c pt does not have any eye discharge at this time. Pt is sure she has pink eye (see symptom notes). Assured her will send message for MD review and someone will call her back shortly to f/u. Agreed to plan.  Symptoms  Reason For Call & Symptoms: Both eyes sclera red and itchy (sandpaper feeling), onset middle of night. Took care of child with pink eye yesterday w/o realizing it. Child had lots of eye discharge and is now on drops for pink eye. Samantha Delacruz states she does not have any eye discharge at this time, just the redness and irritation. They played in pool together yesterday. She states she is sure she has pink eye since she was wiping his eyes all day before he was diagnosed.  Reviewed Health History In EMR: Yes  Reviewed Medications In EMR: Yes  Reviewed Allergies In EMR: Yes  Reviewed Surgeries / Procedures: Yes  Date of Onset of Symptoms: 02/19/2014 OB / GYN:  LMP: 01/27/2014  Guideline(s) Used:  Eye - Red Without Pus  Disposition Per Guideline:   Home Care  Reason For Disposition Reached:   Red eye and no complications  Advice Given:  Red Eye Caused by Mild Irritant  Eye Irrigation: Irrigate the eye with warm water for 2-3 minutes.  Red Eye Caused by Pinkeye:  Contagiousness: Pinkeye is extremely contagious. Try not to touch your eyes. Wash your hands frequently. Do not share towels.  Patient Will Follow Care Advice:  YES

## 2014-02-19 NOTE — Progress Notes (Signed)
Subjective:    Patient ID: Samantha Delacruz, female    DOB: Dec 31, 1965, 48 y.o.   MRN: [id number]  Conjunctivitis  The current episode started yesterday. The onset was sudden. The problem occurs continuously. The problem has been gradually worsening. Relieved by: saline drops  Nothing aggravates the symptoms. Associated symptoms include eye itching, photophobia, eye discharge (little bit, crusty) and eye redness. Pertinent negatives include no orthopnea, no fever, no decreased vision, no double vision, no abdominal pain, no constipation, no diarrhea, no nausea, no vomiting, no congestion, no ear discharge, no ear pain, no headaches, no hearing loss, no mouth sores, no rhinorrhea, no sore throat, no stridor, no swollen glands, no muscle aches, no neck pain, no neck stiffness, no cough, no URI, no wheezing, no rash and no eye pain. Severity: no pain. Both (left is more itchy) eyes are affected.The eye pain is not associated with movement. The eyelid exhibits no abnormality. Sick contacts: at the pool with a kid with pink eye all day yesterday.      Review of Systems  Constitutional: Negative for fever and chills.  HENT: Negative for congestion, ear discharge, ear pain, hearing loss, mouth sores, rhinorrhea and sore throat.   Eyes: Positive for photophobia, discharge (little bit, crusty), redness and itching. Negative for double vision, pain and visual disturbance.  Respiratory: Negative for cough, wheezing and stridor.   Cardiovascular: Negative for chest pain and orthopnea.  Gastrointestinal: Negative for nausea, vomiting, abdominal pain, diarrhea and constipation.  Musculoskeletal: Negative for neck pain.  Skin: Negative for rash.  Neurological: Negative for headaches.  All other systems reviewed and are negative.    Past Medical History  Diagnosis Date  . Allergy   . Asthma   . Migraine   . Adult ADHD   . Rosacea   . Vertigo   . Hemorrhoids, internal, with bleeding and mucous  discharge - Grade 1 02/05/2014    History   Social History  . Marital Status: Married    Spouse Name: N/A    Number of Children: 1  . Years of Education: N/A   Occupational History  .  [place] History Main Topics  . Smoking status: Never Smoker   . Smokeless tobacco: Never Used  . Alcohol Use: Yes     Comment: occ  . Drug Use: No  . Sexual Activity: No   Other Topics Concern  . 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 Cesarean section  08/15/92    son    Family History  Problem Relation Age of Onset  . Depression Mother   . Diabetes Mother   . Emphysema Mother   . Heart disease Father     Allergies  Allergen Reactions  . Latex Rash  . Codeine Hives  . Sulfa Antibiotics     hives    Current Outpatient Prescriptions on File Prior to Visit  Medication Sig Dispense Refill  . albuterol (PROVENTIL HFA;VENTOLIN HFA) 108 (90 BASE) MCG/ACT inhaler Inhale 2 puffs into the lungs every 4 (four) hours as needed for wheezing or shortness of breath.  1 Inhaler  2  . Fluticasone-Salmeterol (ADVAIR DISKUS) 250-50 MCG/DOSE AEPB Inhale 1 puff into  the lungs 2 (two) times daily.  60 each  11  . ibuprofen (ADVIL,MOTRIN) 200 MG tablet Take 600 mg by mouth every 6 (six) hours as needed for moderate pain.      Marland Kitchen loratadine (CLARITIN) 10 MG tablet Take 10 mg by mouth daily.        . Multiple Vitamin (MULTIVITAMIN) tablet Take 1 tablet by mouth daily.       No current facility-administered medications on file prior to visit.    EXAM: BP 110/78  Pulse 66  Temp(Src) 98.6 F (37 C) (Oral)  Resp 18  Wt 192 lb (87.091 kg)  LMP 01/27/2014     Objective:   Physical Exam  Nursing note and vitals reviewed. Constitutional: She is oriented to person,  place, and time. She appears well-developed and well-nourished. No distress.  HENT:  Head: Normocephalic and atraumatic.  Eyes: EOM are normal. Pupils are equal, round, and reactive to light. Right eye exhibits discharge. Left eye exhibits discharge.  Right: Conjunctiva normal, mild amount of watery discharge no matting, no periorbital edema, vision grossly intact.  Left: Conjunctiva erythematous, moderate amount of watery discharge with visible matting, no periorbital edema, vision grossly intact.  Neck: Normal range of motion.  Cardiovascular: Normal rate, regular rhythm and intact distal pulses.   Pulmonary/Chest: Effort normal and breath sounds normal. No respiratory distress. She exhibits no tenderness.  Musculoskeletal: Normal range of motion.  Neurological: She is alert and oriented to person, place, and time.  Skin: Skin is warm and dry. No rash noted. She is not diaphoretic. No erythema. No pallor.  Psychiatric: She has a normal mood and affect. Her behavior is normal. Judgment and thought content normal.     Lab Results  Component Value Date   WBC 8.4 12/03/2013   HGB 13.3 12/03/2013   HCT 39.5 12/03/2013   PLT 320.0 12/03/2013   GLUCOSE 76 12/03/2013   CHOL 158 12/03/2013   TRIG 34.0 12/03/2013   HDL 51.70 12/03/2013   LDLCALC 100* 12/03/2013   ALT 12 12/03/2013   AST 15 12/03/2013   NA 139 12/03/2013   K 3.7 12/03/2013   CL 104 12/03/2013   CREATININE 0.6 12/03/2013   BUN 12 12/03/2013   CO2 27 12/03/2013   TSH 1.30 12/03/2013        Assessment & Plan:  Meosha was seen today for conjunctivitis.  Diagnoses and associated orders for this visit:  Conjunctivitis unspecified Comments: Due to exposure to pink eye, mostly unilateral symptoms, will treat empirically. - trimethoprim-polymyxin b (POLYTRIM) ophthalmic solution; 1-2 drops into the affected eye 4 times daily.    Return precautions provided, and patient handout on conjunctivitis.  Plan to follow up as needed, or  for worsening or persistent symptoms despite treatment.  Patient Instructions  Trimethoprim polymyxin B eyedrops: 1-2 drops into the affected eye 4 times daily for 5 days. Make sure to complete a full 5 days of treatment to prevent recurrent infection.  If emergency symptoms discussed during visit developed, seek medical attention immediately.  Followup as needed, or for worsening or persistent symptoms despite treatment.

## 2014-02-21 ENCOUNTER — Telehealth: Payer: Self-pay | Admitting: Family Medicine

## 2014-02-21 DIAGNOSIS — H109 Unspecified conjunctivitis: Secondary | ICD-10-CM

## 2014-02-21 MED ORDER — ERYTHROMYCIN 5 MG/GM OP OINT: TOPICAL_OINTMENT | OPHTHALMIC | Status: DC

## 2014-02-21 NOTE — Telephone Encounter (Signed)
Called and spoke with pt and pt is aware.  

## 2014-02-21 NOTE — Telephone Encounter (Signed)
Ok to Rx Erythromycin ointment.

## 2014-02-21 NOTE — Telephone Encounter (Signed)
Pt states the medication trimethoprim-polymyxin b (POLYTRIM) ophthalmic solution is causing her eye to itch more and she's now having severe headaches.  She would like another RX for her pink eye and a callback.

## 2014-03-19 ENCOUNTER — Encounter: Payer: Self-pay | Admitting: Internal Medicine

## 2014-03-19 ENCOUNTER — Ambulatory Visit (INDEPENDENT_AMBULATORY_CARE_PROVIDER_SITE_OTHER): Payer: BC Managed Care – PPO | Admitting: Internal Medicine

## 2014-03-19 VITALS — BP 110/70 | HR 80 | Ht 65.0 in | Wt 191.1 lb

## 2014-03-19 DIAGNOSIS — K648 Other hemorrhoids: Secondary | ICD-10-CM

## 2014-03-19 NOTE — Assessment & Plan Note (Signed)
LL and RA banded RTC 2 months may cancel if bleeding stops If bleeding fails to resolve then colonoscopy before more banding

## 2014-03-19 NOTE — Patient Instructions (Addendum)
HEMORRHOID BANDING PROCEDURE    FOLLOW-UP CARE   1. The procedure you have had should have been relatively painless since the banding of the area involved does not have nerve endings and there is no pain sensation.  The rubber band cuts off the blood supply to the hemorrhoid and the band may fall off as soon as 48 hours after the banding (the band may occasionally be seen in the toilet bowl following a bowel movement). You may notice a temporary feeling of fullness in the rectum which should respond adequately to plain Tylenol or Motrin.  2. Following the banding, avoid strenuous exercise that evening and resume full activity the next day.  A sitz bath (soaking in a warm tub) or bidet is soothing, and can be useful for cleansing the area after bowel movements.     3. To avoid constipation, take two tablespoons of natural wheat bran, natural oat bran, flax, Benefiber or any over the counter fiber supplement and increase your water intake to 7-8 glasses daily.    4. Unless you have been prescribed anorectal medication, do not put anything inside your rectum for two weeks: No suppositories, enemas, fingers, etc.  5. Occasionally, you may have more bleeding than usual after the banding procedure.  This is often from the untreated hemorrhoids rather than the treated one.  Don't be concerned if there is a tablespoon or so of blood.  If there is more blood than this, lie flat with your bottom higher than your head and apply an ice pack to the area. If the bleeding does not stop within a half an hour or if you feel faint, call our office at [phone] or go to the emergency room.  6. Problems are not common; however, if there is a substantial amount of bleeding, severe pain, chills, fever or difficulty passing urine (very rare) or other problems, you should call us at [phone] or report to the nearest emergency room.  7. Do not stay seated continuously for more than 2-3 hours for a day or two  after the procedure.  Tighten your buttock muscles 10-15 times every two hours and take 10-15 deep breaths every 1-2 hours.  Do not spend more than a few minutes on the toilet if you cannot empty your bowel; instead re-visit the toilet at a later time.    Continue your fiber therapy.  Follow up with [place] in 2 months, appointment made for 05/26/14,    I appreciate the opportunity to care for you.

## 2014-03-19 NOTE — Progress Notes (Signed)
Patient ID: Samantha Delacruz, female   DOB: 02-May-1966, 48 y.o.   MRN: [id number]        PROCEDURE NOTE: The patient presents with symptomatic grade 1  Hemorrhoids (bleeding and mucous dc), requesting rubber band ligation of his/her hemorrhoidal disease.  All risks, benefits and alternative forms of therapy were described and informed consent was obtained.   The decision was made to band the RA and LL internal hemorrhoids, and the [place] was used to perform band ligation without complication.  Digital anorectal examination was then performed to assure proper positioning of the band, and to adjust the banded tissue as required.  The patient was discharged home without pain or other issues.  Dietary and behavioral recommendations were given and along with follow-up instructions.     The following adjunctive treatments were recommended:  Continue fiber   The patient will return in 2 months for  follow-up and possible additional banding as required. No complications were encountered and the patient tolerated the procedure well.

## 2014-05-26 ENCOUNTER — Ambulatory Visit: Payer: BC Managed Care – PPO | Admitting: Internal Medicine

## 2014-06-16 ENCOUNTER — Encounter: Payer: Self-pay | Admitting: Internal Medicine

## 2014-08-04 ENCOUNTER — Encounter: Payer: Self-pay | Admitting: Family Medicine

## 2014-08-04 ENCOUNTER — Ambulatory Visit (INDEPENDENT_AMBULATORY_CARE_PROVIDER_SITE_OTHER): Payer: BC Managed Care – PPO | Admitting: Family Medicine

## 2014-08-04 VITALS — BP 120/84 | Temp 98.4°F | Wt 193.0 lb

## 2014-08-04 DIAGNOSIS — R000 Tachycardia, unspecified: Secondary | ICD-10-CM

## 2014-08-04 DIAGNOSIS — I8000 Phlebitis and thrombophlebitis of superficial vessels of unspecified lower extremity: Secondary | ICD-10-CM | POA: Insufficient documentation

## 2014-08-04 DIAGNOSIS — R002 Palpitations: Secondary | ICD-10-CM

## 2014-08-04 DIAGNOSIS — I8001 Phlebitis and thrombophlebitis of superficial vessels of right lower extremity: Secondary | ICD-10-CM

## 2014-08-04 NOTE — Progress Notes (Signed)
Pre visit review using our clinic review tool, if applicable. No additional management support is needed unless otherwise documented below in the visit note. 

## 2014-08-04 NOTE — Patient Instructions (Signed)
I would recommend a caffeine free diet to eliminate the palpitations  Another option his medication  Another option is a brisk walk which typically will stop the palpitations  For the SVT in your right lower extremity,,,,,,,,,,,, Motrin 400 mg 3 times daily with food  Elevation....... Beach towel..... Low heat when necessary

## 2014-08-04 NOTE — Progress Notes (Signed)
   Subjective:    Patient ID: Samantha Delacruz, female    DOB: 1965-12-14, 48 y.o.   MRN: [id number]  Samantha Delacruz is a 48 year old female nonsmoker who comes in today for evaluation 2 problems  She noticed on this past Saturday afternoon some tenderness in one of the varicose veins in her right leg. No history of trauma. She's had a history of SVT in the past.  She's also concerned about episodes of palpitation which she's had for about 3-4 years. They can occur once a day or couple times a week or couple times a month. When she decreases her caffeine consumption the palpitations are markedly diminished or abate. She has no history of structural heart disease. The palpitations do not occur with exercise.   Review of Systems    review of systems otherwise negative Objective:   Physical Exam  Well-developed well-nourished female no acute distress vital signs stable she's afebrile cardiac exam is normal  Examination lower extremities is normal except for a swollen varicose vein right posterior medial calf. Homans sign negative      Assessment & Plan:  Superficial phlebitis right leg...Marland KitchenMarland KitchenMarland Kitchen Reassured...... anti-inflammatories....Marland KitchenMarland Kitchen heating pad  Palpitations.......... normal cardiac exam... Normal EKG....... caffeine free diet. We also discussed medications patient would like to treat this with caffeine free diet

## 2014-08-19 ENCOUNTER — Telehealth: Payer: Self-pay | Admitting: Family Medicine

## 2014-08-19 NOTE — Telephone Encounter (Signed)
I  used SDA 08/21/14 at 3:30 for this pt

## 2014-08-21 ENCOUNTER — Ambulatory Visit (INDEPENDENT_AMBULATORY_CARE_PROVIDER_SITE_OTHER): Payer: BC Managed Care – PPO | Admitting: Family Medicine

## 2014-08-21 ENCOUNTER — Encounter: Payer: Self-pay | Admitting: Family Medicine

## 2014-08-21 VITALS — BP 120/80 | Temp 98.3°F | Wt 198.0 lb

## 2014-08-21 DIAGNOSIS — M25551 Pain in right hip: Secondary | ICD-10-CM

## 2014-08-21 NOTE — Progress Notes (Signed)
   Subjective:    Patient ID: Samantha Delacruz, female    DOB: Jan 07, 1966, 49 y.o.   MRN: [id number]  [place] is a 49 year old female comes in today for evaluation of 3 problems  We saw on December with some superficial phlebitis in her right leg that's resolved.  She's got soreness in the right hip she says is a U-shaped area with her pelvis. She says he's had about 15 years. Take some anti-inflammatories it doesn't seem to help  We saw on December she was having episodes of palpitations. We took her off the caffeine the palpitations have pretty much gone review of systems otherwise negative   Review of Systems Review of systems negative    Objective:   Physical Exam  Well-developed well-nourished female no acute distress vital signs stable she's afebrile  Cardiac exam normal    Assessment & Plan:  Palpitations........... continue to refrain from caffeine  Right hip pain........Marland Kitchen or so consult

## 2014-08-21 NOTE — Progress Notes (Signed)
Pre visit review using our clinic review tool, if applicable. No additional management support is needed unless otherwise documented below in the visit note. 

## 2014-08-21 NOTE — Patient Instructions (Signed)
Call and make an appointment to see Dr.  Joni Fears orthopedist for evaluation of your hip  In the meantime continue Motrin 600 mg twice daily  Continue to avoid caffeine

## 2014-09-22 ENCOUNTER — Encounter: Payer: Self-pay | Admitting: Family Medicine

## 2014-09-22 ENCOUNTER — Ambulatory Visit (INDEPENDENT_AMBULATORY_CARE_PROVIDER_SITE_OTHER): Payer: BC Managed Care – PPO | Admitting: Family Medicine

## 2014-09-22 VITALS — BP 130/88 | HR 80 | Temp 98.5°F | Wt 200.0 lb

## 2014-09-22 DIAGNOSIS — J329 Chronic sinusitis, unspecified: Secondary | ICD-10-CM

## 2014-09-22 DIAGNOSIS — B349 Viral infection, unspecified: Secondary | ICD-10-CM

## 2014-09-22 DIAGNOSIS — B9789 Other viral agents as the cause of diseases classified elsewhere: Secondary | ICD-10-CM

## 2014-09-22 DIAGNOSIS — J069 Acute upper respiratory infection, unspecified: Secondary | ICD-10-CM

## 2014-09-22 MED ORDER — BENZONATATE 100 MG PO CAPS: 100.0000 mg | ORAL_CAPSULE | Freq: Two times a day (BID) | ORAL | Status: DC | PRN

## 2014-09-22 MED ORDER — PREDNISONE 20 MG PO TABS: ORAL_TABLET | ORAL | Status: DC

## 2014-09-22 NOTE — Patient Instructions (Signed)
Viral Sinusitis  Prednisone for 7 days  Neti pot  Rest/lots of fluids  Follow up if no improvement after course or if worsens or if fevers

## 2014-09-22 NOTE — Progress Notes (Addendum)
  Garret Reddish, MD Phone: [phone]  Subjective:   Samantha Delacruz is a 49 y.o. year old very pleasant female patient who presents with the following:  Upper respiratory infection Last Wednesday took husband to hospital for outpatient procedure. Started with a cough which eventually caused a feeling of outer chest pain/tightness. No clear wheezing. Not taking advair daily. Been drinking fluids and loratadine. Afraid to take albuterol due to palpitations recently under care of Dr. Sherren Mocha. On Friday started with sinus congestion, clear nasal drainage. Now with severe to moderate sinus pressure requiring ibuprofen which does relieve symptoms some.   Sister with similar symptoms and treated with prednisone and z pack and did not resolve. 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   ROS- no fever/chills/nausea/vomiting. No shortness of breath.   Past Medical History- BPPV, asthma on advair  Medications- reviewed and updated Current Outpatient Prescriptions  Medication Sig Dispense Refill  . AMBULATORY NON FORMULARY MEDICATION Generic fiber    . Fluticasone-Salmeterol (ADVAIR DISKUS) 250-50 MCG/DOSE AEPB Inhale 1 puff into the lungs 2 (two) times daily. 60 each 11  . ibuprofen (ADVIL,MOTRIN) 200 MG tablet Take 600 mg by mouth every 6 (six) hours as needed for moderate pain.    Marland Kitchen loratadine (CLARITIN) 10 MG tablet Take 10 mg by mouth daily.      . Multiple Vitamin (MULTIVITAMIN) tablet Take 1 tablet by mouth daily.    . polycarbophil (FIBERCON) 625 MG tablet Take 625 mg by mouth daily.    Marland Kitchen albuterol (PROVENTIL HFA;VENTOLIN HFA) 108 (90 BASE) MCG/ACT inhaler Inhale 2 puffs into the lungs every 4 (four) hours as needed for wheezing or shortness of breath. (Patient not taking: Reported on 09/22/2014) 1 Inhaler 2   Objective: BP 130/88 mmHg  Pulse 80  Temp(Src) 98.5 F (36.9 C)  Wt 200 lb (90.719 kg)  SpO2 97% Gen: NAD, resting comfortably HEENT: nares erythematous and  swollen with clear drainage only, oropharynx normal without pharyngeal exudate, TM normal bilaterally, Mucous membranes are moist. No lymphadenopathy. Maxillary sinus tenderness bilaterally.  CV: RRR no murmurs rubs or gallops Lungs: CTAB no crackles, wheeze, rhonchi. Good air movement.  Ext: no edema Skin: warm, dry, no rash   Assessment/Plan:  Viral Upper Respiratory Infection with sinusitis component Advised of symptomatic care (see AVS).  Sounds like started as viral URI, fortunately asthma does not appear to have flared Now with moderate to severe sinus tenderness. Appears to have viral sinusitis as well and will treat with 7 days of prednisone Also requests tessalon pearls as very helpful in past.   Advised to restart advair as instructed previously.   Meds ordered this encounter  Medications  . predniSONE (DELTASONE) 20 MG tablet    Sig: Take 2 pills for 3 days, then 1 pill daily until finished.    Dispense:  10 tablet    Refill:  0  . benzonatate (TESSALON) 100 MG capsule    Sig: Take 1 capsule (100 mg total) by mouth 2 (two) times daily as needed for cough.    Dispense:  30 capsule    Refill:  0

## 2014-09-22 NOTE — Addendum Note (Signed)
Addended by: Marin Olp on: 09/22/2014 04:13 PM   Modules accepted: Orders

## 2014-09-29 ENCOUNTER — Telehealth: Payer: Self-pay | Admitting: Family Medicine

## 2014-09-29 NOTE — Telephone Encounter (Signed)
Patient having left ear pain.  Can I schedule her to see Dr. Yong Channel tomorrow at 10:30?

## 2014-09-29 NOTE — Telephone Encounter (Signed)
That's fine

## 2014-09-30 ENCOUNTER — Encounter: Payer: Self-pay | Admitting: Family Medicine

## 2014-09-30 ENCOUNTER — Ambulatory Visit (INDEPENDENT_AMBULATORY_CARE_PROVIDER_SITE_OTHER): Payer: BC Managed Care – PPO | Admitting: Family Medicine

## 2014-09-30 VITALS — BP 100/62 | Temp 98.2°F | Wt 198.0 lb

## 2014-09-30 DIAGNOSIS — A499 Bacterial infection, unspecified: Secondary | ICD-10-CM

## 2014-09-30 DIAGNOSIS — J329 Chronic sinusitis, unspecified: Secondary | ICD-10-CM

## 2014-09-30 DIAGNOSIS — B9689 Other specified bacterial agents as the cause of diseases classified elsewhere: Secondary | ICD-10-CM

## 2014-09-30 DIAGNOSIS — H66002 Acute suppurative otitis media without spontaneous rupture of ear drum, left ear: Secondary | ICD-10-CM

## 2014-09-30 MED ORDER — AMOXICILLIN-POT CLAVULANATE 875-125 MG PO TABS: 1.0000 | ORAL_TABLET | Freq: Two times a day (BID) | ORAL | Status: DC

## 2014-09-30 NOTE — Patient Instructions (Signed)
L ear infection Rebound of sinus pressure/congestion concerning for bacterial sinusitis  Augmentin for 8 days.  Follow up if symptoms do not resolve with treatment or if new or worsening symptoms.

## 2014-09-30 NOTE — Progress Notes (Signed)
  Garret Reddish, MD Phone: [phone]  Subjective:   Samantha Delacruz is a 49 y.o. year old very pleasant female patient who presents with the following:  L ear pain Recurrent sinus pressure/nasal congestion and drainage -finished last dose of prednisone Sunday morning. Was feeling fantastic and about 90% better. Sunday night woke up with sinus pressure and congestion. 4 am had severe left ear pain. Spent yesterday on heating pad for 5 hours due to pain which helped some as well as some tylenol.. Feels slightly better today. Pain 2/10 right now after medication. Still having sinus pressure and congestion but better than Sunday  ROS- no fever/chills/nausea/vomiting. n oshortness of breath  Past Medical History- BPPV, viral sinusitis treated with prednisone 8 days ago, asthma  Medications- reviewed and updated Current Outpatient Prescriptions  Medication Sig Dispense Refill  . AMBULATORY NON FORMULARY MEDICATION Generic fiber    . Fluticasone-Salmeterol (ADVAIR DISKUS) 250-50 MCG/DOSE AEPB Inhale 1 puff into the lungs 2 (two) times daily. 60 each 11  . ibuprofen (ADVIL,MOTRIN) 200 MG tablet Take 600 mg by mouth every 6 (six) hours as needed for moderate pain.    Marland Kitchen loratadine (CLARITIN) 10 MG tablet Take 10 mg by mouth daily.      . Multiple Vitamin (MULTIVITAMIN) tablet Take 1 tablet by mouth daily.    . polycarbophil (FIBERCON) 625 MG tablet Take 625 mg by mouth daily.    Marland Kitchen albuterol (PROVENTIL HFA;VENTOLIN HFA) 108 (90 BASE) MCG/ACT inhaler Inhale 2 puffs into the lungs every 4 (four) hours as needed for wheezing or shortness of breath. (Patient not taking: Reported on 09/30/2014) 1 Inhaler 2   No current facility-administered medications for this visit.    Objective: BP 100/62 mmHg  Temp(Src) 98.2 F (36.8 C)  Wt 198 lb (89.812 kg) Gen: NAD, resting comfortably Mucous membranes are moist. Oropharynx normal. Yellow drainage in nares with erythema. R TM normal. L TM  erythematous, bulging.  CV: RRR no murmurs rubs or gallops Lungs: CTAB no crackles, wheeze, rhonchi Ext: no edema Skin: warm, dry, no rash   Assessment/Plan:  L otitis media-likely bacterial Viral sinusitis with worsening after significant improvement suspicious for bacterial sinusitis.   Cover both with augmentin x 8 days. Follow up  Per AVS  Meds ordered this encounter  Medications  . amoxicillin-clavulanate (AUGMENTIN) 875-125 MG per tablet    Sig: Take 1 tablet by mouth 2 (two) times daily.    Dispense:  16 tablet    Refill:  0

## 2014-10-06 ENCOUNTER — Telehealth: Payer: Self-pay | Admitting: Family Medicine

## 2014-10-06 NOTE — Telephone Encounter (Signed)
Please advise 

## 2014-10-06 NOTE — Telephone Encounter (Signed)
You can call her in 2 more days of augmentin if she desires. The best thing would be for her to be seen by me or Dr. Sherren Mocha. Beyond those 2 extra days, she needs to be seen. My concern if she does this is that it will push her to weekend then she won't be able to be seen. Also going to forward to [place] who can mention to Dr. Sherren Mocha.

## 2014-10-06 NOTE — Telephone Encounter (Signed)
Pt is here in the office today and is still having ongoing left ear pain and lage amounts of Mucus. Has taken a decongestant that is now causing her sinus pain. Only has three more pills of her antibiotic left. Has tried Afrin and is caused her to feel like she was going to have a heart attack. Has been seen twice for this and is trying to avoid having to pay another copay.

## 2014-10-07 MED ORDER — AMOXICILLIN-POT CLAVULANATE 875-125 MG PO TABS: 1.0000 | ORAL_TABLET | Freq: Two times a day (BID) | ORAL | Status: DC

## 2014-10-07 NOTE — Telephone Encounter (Signed)
LM on pt vm TCB to give Dr. Yong Channel recommendations.

## 2014-10-07 NOTE — Telephone Encounter (Signed)
Pt request 2 more days of anti biotic to be called in.

## 2014-10-07 NOTE — Telephone Encounter (Signed)
noted 

## 2014-10-08 ENCOUNTER — Telehealth: Payer: Self-pay | Admitting: Family Medicine

## 2014-10-08 NOTE — Telephone Encounter (Signed)
Pt is still having ear pain. Pt would like an appt with dr todd this week. Pt is still on abx. Pt saw dr hunter on 09-30-14

## 2014-10-09 ENCOUNTER — Telehealth: Payer: Self-pay | Admitting: *Deleted

## 2014-10-09 DIAGNOSIS — R05 Cough: Secondary | ICD-10-CM

## 2014-10-09 DIAGNOSIS — J029 Acute pharyngitis, unspecified: Secondary | ICD-10-CM

## 2014-10-09 DIAGNOSIS — H9209 Otalgia, unspecified ear: Secondary | ICD-10-CM

## 2014-10-09 DIAGNOSIS — R059 Cough, unspecified: Secondary | ICD-10-CM

## 2014-10-09 NOTE — Telephone Encounter (Signed)
Patient request refill

## 2014-10-09 NOTE — Telephone Encounter (Signed)
Left message on machine for patient that she should to to ENT at this time.  Patient should call back if referral needed.

## 2014-11-10 ENCOUNTER — Encounter: Payer: Self-pay | Admitting: Internal Medicine

## 2014-11-28 ENCOUNTER — Telehealth: Payer: Self-pay | Admitting: Family Medicine

## 2014-11-28 DIAGNOSIS — G43809 Other migraine, not intractable, without status migrainosus: Secondary | ICD-10-CM

## 2014-11-28 NOTE — Telephone Encounter (Signed)
Pt got migraine in eye about 2 weeks ago and had another in the other eye yesterday. Would liek to know if Dr Sherren Mocha has a specialist he prefers for such issues

## 2014-12-01 NOTE — Telephone Encounter (Signed)
Per Dr Sherren Mocha patient should go to neurology. Left message on machine for patient.

## 2014-12-24 ENCOUNTER — Other Ambulatory Visit (INDEPENDENT_AMBULATORY_CARE_PROVIDER_SITE_OTHER): Payer: BC Managed Care – PPO

## 2014-12-24 DIAGNOSIS — Z0000 Encounter for general adult medical examination without abnormal findings: Secondary | ICD-10-CM | POA: Diagnosis not present

## 2014-12-24 LAB — LIPID PANEL
CHOL/HDL RATIO: 3
Cholesterol: 162 mg/dL (ref 0–200)
HDL: 46.5 mg/dL (ref >39.00)
LDL CALC: 101 mg/dL — AB (ref 0–99)
NonHDL: 115.5
Triglycerides: 74 mg/dL (ref 0.0–149.0)
VLDL: 14.8 mg/dL (ref 0.0–40.0)

## 2014-12-24 LAB — POCT URINALYSIS DIPSTICK
Bilirubin, UA: NEGATIVE
GLUCOSE UA: NEGATIVE
KETONES UA: NEGATIVE
Leukocytes, UA: NEGATIVE
Nitrite, UA: NEGATIVE
Protein, UA: NEGATIVE
Urobilinogen, UA: 0.2
pH, UA: 6

## 2014-12-24 LAB — COMPREHENSIVE METABOLIC PANEL
ALBUMIN: 3.9 g/dL (ref 3.5–5.2)
ALK PHOS: 60 U/L (ref 39–117)
ALT: 15 U/L (ref 0–35)
AST: 17 U/L (ref 0–37)
BUN: 10 mg/dL (ref 6–23)
CALCIUM: 8.9 mg/dL (ref 8.4–10.5)
CO2: 30 meq/L (ref 19–32)
Chloride: 103 mEq/L (ref 96–112)
Creatinine, Ser: 0.62 mg/dL (ref 0.40–1.20)
GFR: 108.24 mL/min (ref >60.00)
GLUCOSE: 82 mg/dL (ref 70–99)
Potassium: 3.8 mEq/L (ref 3.5–5.1)
Sodium: 138 mEq/L (ref 135–145)
TOTAL PROTEIN: 6.9 g/dL (ref 6.0–8.3)
Total Bilirubin: 0.4 mg/dL (ref 0.2–1.2)

## 2014-12-24 LAB — CBC WITH DIFFERENTIAL/PLATELET
BASOS PCT: 0.5 % (ref 0.0–3.0)
Basophils Absolute: 0 10*3/uL (ref 0.0–0.1)
EOS PCT: 4.4 % (ref 0.0–5.0)
Eosinophils Absolute: 0.3 10*3/uL (ref 0.0–0.7)
HCT: 38.4 % (ref 36.0–46.0)
HEMOGLOBIN: 13.1 g/dL (ref 12.0–15.0)
LYMPHS PCT: 34 % (ref 12.0–46.0)
Lymphs Abs: 2.7 10*3/uL (ref 0.7–4.0)
MCHC: 34.3 g/dL (ref 30.0–36.0)
MCV: 85.2 fl (ref 78.0–100.0)
Monocytes Absolute: 0.6 10*3/uL (ref 0.1–1.0)
Monocytes Relative: 8.2 % (ref 3.0–12.0)
NEUTROS ABS: 4.1 10*3/uL (ref 1.4–7.7)
Neutrophils Relative %: 52.9 % (ref 43.0–77.0)
Platelets: 309 10*3/uL (ref 150.0–400.0)
RBC: 4.5 Mil/uL (ref 3.87–5.11)
RDW: 13.3 % (ref 11.5–15.5)
WBC: 7.8 10*3/uL (ref 4.0–10.5)

## 2014-12-24 LAB — TSH: TSH: 1.66 u[IU]/mL (ref 0.35–4.50)

## 2014-12-29 ENCOUNTER — Ambulatory Visit (INDEPENDENT_AMBULATORY_CARE_PROVIDER_SITE_OTHER): Payer: BC Managed Care – PPO | Admitting: Family Medicine

## 2014-12-29 ENCOUNTER — Encounter: Payer: Self-pay | Admitting: Family Medicine

## 2014-12-29 VITALS — BP 120/84 | Temp 98.6°F | Ht 65.75 in | Wt 200.0 lb

## 2014-12-29 DIAGNOSIS — E663 Overweight: Secondary | ICD-10-CM

## 2014-12-29 DIAGNOSIS — Z0000 Encounter for general adult medical examination without abnormal findings: Secondary | ICD-10-CM

## 2014-12-29 DIAGNOSIS — J4530 Mild persistent asthma, uncomplicated: Secondary | ICD-10-CM | POA: Diagnosis not present

## 2014-12-29 DIAGNOSIS — J4531 Mild persistent asthma with (acute) exacerbation: Secondary | ICD-10-CM

## 2014-12-29 DIAGNOSIS — N951 Menopausal and female climacteric states: Secondary | ICD-10-CM | POA: Diagnosis not present

## 2014-12-29 DIAGNOSIS — M25551 Pain in right hip: Secondary | ICD-10-CM

## 2014-12-29 DIAGNOSIS — J45909 Unspecified asthma, uncomplicated: Secondary | ICD-10-CM | POA: Insufficient documentation

## 2014-12-29 LAB — VITAMIN D 25 HYDROXY (VIT D DEFICIENCY, FRACTURES): VITD: 13.76 ng/mL — AB (ref 30.00–100.00)

## 2014-12-29 MED ORDER — ALBUTEROL SULFATE HFA 108 (90 BASE) MCG/ACT IN AERS: 2.0000 | INHALATION_SPRAY | RESPIRATORY_TRACT | Status: DC | PRN

## 2014-12-29 MED ORDER — FLUTICASONE-SALMETEROL 250-50 MCG/DOSE IN AEPB: 1.0000 | INHALATION_SPRAY | Freq: Two times a day (BID) | RESPIRATORY_TRACT | Status: DC

## 2014-12-29 NOTE — Progress Notes (Signed)
   Subjective:    Patient ID: Samantha Delacruz, female    DOB: 1965/10/10, 49 y.o.   MRN: [id number]  Samantha Delacruz is a 49 year old female nonsmoker who comes in today for general physical examination  She's had a history of being overweight. Weight today is 200 and pounds. She does not exercise on a regular basis. Encouraged to begin a diet and exercise program as we have many times in the past  She has a history of asthma but only takes her Advair intermittently. Advised to take it on a daily basis. Her worst time is in the fall 2 February  She takes Claritin for allergic rhinitis and albuterol when necessary when she has a breakthrough flare of her asthma  She gets routine eye care, dental care, does not do BSE monthly but does get a mammogram once a year. She set up in GI for colonoscopy this summer. LMP a month ago normal for birth control she's not using anything. Last Pap last year normal  She tells me she did a walking program over the weekend and had severe pain in her right hip. No history of trauma.   Review of Systems  Constitutional: Negative.   HENT: Negative.   Eyes: Negative.   Respiratory: Negative.   Cardiovascular: Negative.   Gastrointestinal: Negative.   Endocrine: Negative.   Genitourinary: Negative.   Musculoskeletal: Negative.   Skin: Negative.   Allergic/Immunologic: Negative.   Neurological: Negative.   Hematological: Negative.   Psychiatric/Behavioral: Negative.        Objective:   Physical Exam  Constitutional: She appears well-developed and well-nourished.  HENT:  Head: Normocephalic and atraumatic.  Right Ear: External ear normal.  Left Ear: External ear normal.  Nose: Nose normal.  Mouth/Throat: Oropharynx is clear and moist.  Eyes: EOM are normal. Pupils are equal, round, and reactive to light.  Neck: Normal range of motion. Neck supple. No JVD present. No tracheal deviation present. No thyromegaly present.  Cardiovascular: Normal  rate, regular rhythm, normal heart sounds and intact distal pulses.  Exam reveals no gallop and no friction rub.   No murmur heard. Pulmonary/Chest: Effort normal and breath sounds normal. No stridor. No respiratory distress. She has no wheezes. She has no rales. She exhibits no tenderness.  Abdominal: Soft. Bowel sounds are normal. She exhibits no distension and no mass. There is no tenderness. There is no rebound and no guarding.  Genitourinary:  Bilateral breast exam normal  Pelvic and Pap Samantha Delacruz normal therefore not repeated  Musculoskeletal: Normal range of motion.  Left hip normal external rotation right hip limited external rotation to about 30.  Lymphadenopathy:    She has no cervical adenopathy.  Neurological: She is alert. She has normal reflexes. No cranial nerve deficit. She exhibits normal muscle tone. Coordination normal.  Skin: Skin is warm and dry. No rash noted. No erythema. No pallor.  Psychiatric: She has a normal mood and affect. Her behavior is normal. Judgment and thought content normal.  Nursing note and vitals reviewed.         Assessment & Plan:  Healthy female  Overweight........... again stressed the importance of diet exercise and weight loss  Asthma........Marland Kitchen recommend she use the Advair 1 puff twice a day all year-round not episodically  Right hip pain......... x-ray hip.........Marland Kitchen Motrin 400 twice a day begin a walking program

## 2014-12-29 NOTE — Progress Notes (Signed)
Pre visit review using our clinic review tool, if applicable. No additional management support is needed unless otherwise documented below in the visit note. 

## 2014-12-29 NOTE — Patient Instructions (Signed)
Go to the main office for an x-ray of your right hip  We'll call you the report  Begin Motrin 400 mg twice daily with food  The Advair.........Marland Kitchen 1 puff twice daily or year round........ when you have a flare of your wheezing then increase the dose to 2 puffs twice daily  Follow-up in one year sooner if any problems  Beaulah Dinning,,,,,,,,,,,, our new adult nurse practitioner who will be taking over for me after June 1

## 2015-01-01 ENCOUNTER — Telehealth: Payer: Self-pay | Admitting: Family Medicine

## 2015-01-01 NOTE — Telephone Encounter (Addendum)
Pt  has an order for xray and would like to included lower back and right leg also. Please put another order in system

## 2015-01-01 NOTE — Telephone Encounter (Signed)
Left message on machine for patient that per Dr Sherren Mocha xray of legs are not needed at this time

## 2015-01-02 ENCOUNTER — Ambulatory Visit (INDEPENDENT_AMBULATORY_CARE_PROVIDER_SITE_OTHER)
Admission: RE | Admit: 2015-01-02 | Discharge: 2015-01-02 | Disposition: A | Discharge status: Home / Self Care | Payer: BC Managed Care – PPO | Source: Ambulatory Visit | Attending: Family Medicine | Admitting: Family Medicine

## 2015-01-02 ENCOUNTER — Ambulatory Visit: Payer: BC Managed Care – PPO | Admitting: Neurology

## 2015-01-02 DIAGNOSIS — M25551 Pain in right hip: Secondary | ICD-10-CM

## 2015-01-15 ENCOUNTER — Telehealth: Payer: Self-pay | Admitting: Family Medicine

## 2015-01-15 NOTE — Telephone Encounter (Addendum)
Pt would like results of xray done 5/20 on her hip

## 2015-01-16 NOTE — Telephone Encounter (Signed)
Left message on machine for patient that x-ray result is normal.  Okay for patient to have a referral to Ortho if she would like.

## 2015-01-21 ENCOUNTER — Encounter: Payer: BC Managed Care – PPO | Admitting: Gynecology

## 2015-02-11 ENCOUNTER — Encounter: Payer: BC Managed Care – PPO | Admitting: Internal Medicine

## 2015-02-26 ENCOUNTER — Encounter: Payer: Self-pay | Admitting: Neurology

## 2015-02-26 ENCOUNTER — Telehealth: Payer: Self-pay | Admitting: *Deleted

## 2015-02-26 ENCOUNTER — Ambulatory Visit (INDEPENDENT_AMBULATORY_CARE_PROVIDER_SITE_OTHER): Payer: BC Managed Care – PPO | Admitting: Neurology

## 2015-02-26 VITALS — BP 140/86 | HR 68 | Resp 18 | Ht 65.0 in | Wt 190.7 lb

## 2015-02-26 DIAGNOSIS — G43809 Other migraine, not intractable, without status migrainosus: Secondary | ICD-10-CM | POA: Diagnosis not present

## 2015-02-26 DIAGNOSIS — G43109 Migraine with aura, not intractable, without status migrainosus: Secondary | ICD-10-CM | POA: Insufficient documentation

## 2015-02-26 DIAGNOSIS — G43009 Migraine without aura, not intractable, without status migrainosus: Secondary | ICD-10-CM | POA: Insufficient documentation

## 2015-02-26 NOTE — Progress Notes (Signed)
NEUROLOGY CONSULTATION NOTE  Samantha Delacruz MRN: [id number] DOB: 07/13/1966  Referring provider: Dr. Sherren Mocha Primary care provider: Dr. Sherren Mocha  Reason for consult:  migraine  HISTORY OF PRESENT ILLNESS: Samantha Delacruz is a 49 year old right-handed woman with asthma who presents for migraine.    She has history of ocular migraines. She had her first ocular migraine about 10 years ago.  She cannot remember which side it occurred.  Objects in the vision of that eye started moving around in a circle.  It was accompanied by unilateral retro-orbital pain lasting an hour, followed by unilateral pounding headache.  They occur infrequently.  About 3 months ago, she had two episodes but they occurred on different sides,which was concerning to her .  The visual disturbance was not as pronounced.    She also has more typical migraines, as well. Onset:  Since young adulthood Location:  Unilateral, either side Quality:  pounding Intensity:  5/10 Aura:  no Prodrome:  no Associated symptoms:  Nausea, photophobia, phonophobia, osmophobia, sometimes vomiting Duration:  Several hours Frequency:  4 times a month Triggers/exacerbating factors:  Light, loud noise, menstrual cycle Relieving factors:  Laying down in dark, cool, quiet place Activity:  Can be active if needed.  Past abortive therapy:  none Past preventative therapy:  Botox (effective but caused pain)  Current abortive therapy:  Ibuprofen followed by Tylenol (if needed) Current preventative therapy:  none Other medication:  Albuterol She had a heart flutter and was told not to drink caffeine  Caffeine:  stopped Alcohol:  occasional Smoker:  no Diet:  Could be healthier.  Trying to drink more water Exercise:  Not routine Depression/stress:  Possibly related to menopause Sleep hygiene:  good Family history of headache:  Uncertain (both her parents died many years ago).  Her mother had a stroke.  Her maternal grandmother may  have had a cerebral aneurysm.  PAST MEDICAL HISTORY: Past Medical History  Diagnosis Date  . Allergy   . Asthma   . Migraine   . Adult ADHD   . Rosacea   . Vertigo   . Hemorrhoids, internal, with bleeding and mucous discharge - Grade 1 02/05/2014    PAST SURGICAL HISTORY: Past Surgical History  Procedure Laterality Date  . Cesarean section  08/15/92    son    MEDICATIONS: Current Outpatient Prescriptions on File Prior to Visit  Medication Sig Dispense Refill  . albuterol (PROVENTIL HFA;VENTOLIN HFA) 108 (90 BASE) MCG/ACT inhaler Inhale 2 puffs into the lungs every 4 (four) hours as needed for wheezing or shortness of breath. 1 Inhaler 2  . AMBULATORY NON FORMULARY MEDICATION Generic fiber    . Fluticasone-Salmeterol (ADVAIR DISKUS) 250-50 MCG/DOSE AEPB Inhale 1 puff into the lungs 2 (two) times daily. 60 each 11  . ibuprofen (ADVIL,MOTRIN) 200 MG tablet Take 600 mg by mouth every 6 (six) hours as needed for moderate pain.    Marland Kitchen loratadine (CLARITIN) 10 MG tablet Take 10 mg by mouth daily.      . Multiple Vitamin (MULTIVITAMIN) tablet Take 1 tablet by mouth daily.    . polycarbophil (FIBERCON) 625 MG tablet Take 625 mg by mouth daily.     No current facility-administered medications on file prior to visit.    ALLERGIES: Allergies  Allergen Reactions  . Latex Rash  . Codeine Hives  . Lactose Intolerance (Gi)     Gets diarrhea and cramping  . Sulfa Antibiotics     hives    FAMILY HISTORY:  Family History  Problem Relation Age of Onset  . Depression Mother   . Diabetes Mother   . Emphysema Mother   . Heart disease Father   . Thyroid disease Sister   . Thyroid disease Brother   . ADD / ADHD Son     SOCIAL HISTORY: History   Social History  . Marital Status: Married    Spouse Name: N/A  . Number of Children: 1  . Years of Education: N/A   Occupational History  .  [place] History Main Topics  . Smoking status: Never Smoker   .  Smokeless tobacco: Never Used  . Alcohol Use: Yes     Comment: occ  . Drug Use: No  . Sexual Activity: No   Other Topics Concern  . 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   No caffeine   02/05/2014          REVIEW OF SYSTEMS: Constitutional: No fevers, chills, or sweats, no generalized fatigue, change in appetite Eyes: No visual changes, double vision, eye pain Ear, nose and throat: No hearing loss, ear pain, nasal congestion, sore throat Cardiovascular: No chest pain, palpitations Respiratory:  No shortness of breath at rest or with exertion, wheezes GastrointestinaI: No nausea, vomiting, diarrhea, abdominal pain, fecal incontinence Genitourinary:  No dysuria, urinary retention or frequency Musculoskeletal:  No neck pain, back pain Integumentary: No rash, pruritus, skin lesions Neurological: as above Psychiatric: No depression, insomnia, anxiety Endocrine: No palpitations, fatigue, diaphoresis, mood swings, change in appetite, change in weight, increased thirst Hematologic/Lymphatic:  No anemia, purpura, petechiae. Allergic/Immunologic: no itchy/runny eyes, nasal congestion, recent allergic reactions, rashes  PHYSICAL EXAM: Filed Vitals:   02/26/15 0955  BP: 140/86  Pulse: 68  Resp: 18   General: No acute distress.  Patient appears well-groomed.   Head:  Normocephalic/atraumatic Eyes:  fundi unremarkable, without vessel changes, exudates, hemorrhages or papilledema. Neck: supple, no paraspinal tenderness, full range of motion Back: No paraspinal tenderness Heart: regular rate and rhythm Lungs: Clear to auscultation bilaterally. Vascular: No carotid bruits. Neurological Exam: Mental status: alert and oriented to person, place, and time, recent and remote memory intact, fund of knowledge intact, attention and concentration intact, speech fluent and not  dysarthric, language intact. Cranial nerves: CN I: not tested CN II: pupils equal, round and reactive to light, visual fields intact, fundi unremarkable, without vessel changes, exudates, hemorrhages or papilledema. CN III, IV, VI:  full range of motion, no nystagmus, no ptosis CN V: facial sensation intact CN VII: upper and lower face symmetric CN VIII: hearing intact CN IX, X: gag intact, uvula midline CN XI: sternocleidomastoid and trapezius muscles intact CN XII: tongue midline Bulk & Tone: normal, no fasciculations. Motor:  5/5 throughout Sensation:  Pinprick and vibration intact Deep Tendon Reflexes:  2+ throughout, toes downgoing Finger to nose testing:  No dysmetria Heel to shin:  No dysmetria Gait:  Normal station and stride.  Able to turn and walk in tandem. Romberg negative.  IMPRESSION: Ocular migraines.  I don't suspect another etiology for her episodes 3 months ago.  She has a known history and these recent events did not present with any symptoms concerning for another process. Migraine without aura.    PLAN: No further workup regarding the ocular migraines. As for typical migraines, she may want to try naproxen  500mg .  Regarding her "heart flutter", I do not know the significance of this.  She was told she cannot drink caffeine, which is effective treatment for migraines.  If there is no contraindication (such as arrhythmia due to conduction pathway disorder), then may consider a triptan such as sumatriptan. PCP may want to recheck BP at some point. She would like further evaluation of her vision, so we will refer her to ophthalmology. Follow up as needed if migraines become more difficult to manage  45 minutes spent face to face with patient, over 50% spent discussing diagnosis and management.  Thank you for allowing me to take part in the care of this patient.  Metta Clines, DO  CC:  Stevie Kern, MD

## 2015-02-26 NOTE — Telephone Encounter (Signed)
Patient is aware she has appointment with Dr Valetta Close 03/13/15 at 9am  Records were faxed

## 2015-02-26 NOTE — Patient Instructions (Addendum)
I think both episodes were still ocular migraines. As far as treating the migraine headache, consider naproxen 500mg .  If there is no contraindication (such as arrhythmia due to cardiac conduction problem), then consider sumatriptan Follow up as needed.

## 2015-02-27 ENCOUNTER — Ambulatory Visit (INDEPENDENT_AMBULATORY_CARE_PROVIDER_SITE_OTHER): Payer: BC Managed Care – PPO | Admitting: Gynecology

## 2015-02-27 ENCOUNTER — Other Ambulatory Visit (HOSPITAL_COMMUNITY)
Admission: RE | Admit: 2015-02-27 | Discharge: 2015-02-27 | Disposition: A | Discharge status: Home / Self Care | Payer: BC Managed Care – PPO | Source: Ambulatory Visit | Attending: Gynecology | Admitting: Gynecology

## 2015-02-27 ENCOUNTER — Encounter: Payer: Self-pay | Admitting: Gynecology

## 2015-02-27 VITALS — BP 120/76 | Ht 65.0 in | Wt 191.0 lb

## 2015-02-27 DIAGNOSIS — Z01419 Encounter for gynecological examination (general) (routine) without abnormal findings: Secondary | ICD-10-CM | POA: Insufficient documentation

## 2015-02-27 DIAGNOSIS — N951 Menopausal and female climacteric states: Secondary | ICD-10-CM

## 2015-02-27 NOTE — Patient Instructions (Signed)
You may obtain a copy of any labs that were done today by logging onto MyChart as outlined in the instructions provided with your AVS (after visit summary). The office will not call with normal lab results but certainly if there are any significant abnormalities then we will contact you.   Health Maintenance, Female A healthy lifestyle and preventative care can promote health and wellness.  Maintain regular health, dental, and eye exams.  Eat a healthy diet. Foods like vegetables, fruits, whole grains, low-fat dairy products, and lean protein foods contain the nutrients you need without too many calories. Decrease your intake of foods high in solid fats, added sugars, and salt. Get information about a proper diet from your caregiver, if necessary.  Regular physical exercise is one of the most important things you can do for your health. Most adults should get at least 150 minutes of moderate-intensity exercise (any activity that increases your heart rate and causes you to sweat) each week. In addition, most adults need muscle-strengthening exercises on 2 or more days a week.   Maintain a healthy weight. The body mass index (BMI) is a screening tool to identify possible weight problems. It provides an estimate of body fat based on height and weight. Your caregiver can help determine your BMI, and can help you achieve or maintain a healthy weight. For adults 20 years and older:  A BMI below 18.5 is considered underweight.  A BMI of 18.5 to 24.9 is normal.  A BMI of 25 to 29.9 is considered overweight.  A BMI of 30 and above is considered obese.  Maintain normal blood lipids and cholesterol by exercising and minimizing your intake of saturated fat. Eat a balanced diet with plenty of fruits and vegetables. Blood tests for lipids and cholesterol should begin at age 61 and be repeated every 5 years. If your lipid or cholesterol levels are high, you are over 50, or you are a high risk for heart  disease, you may need your cholesterol levels checked more frequently.Ongoing high lipid and cholesterol levels should be treated with medicines if diet and exercise are not effective.  If you smoke, find out from your caregiver how to quit. If you do not use tobacco, do not start.  Lung cancer screening is recommended for adults aged 33 80 years who are at high risk for developing lung cancer because of a history of smoking. Yearly low-dose computed tomography (CT) is recommended for people who have at least a 30-pack-year history of smoking and are a current smoker or have quit within the past 15 years. A pack year of smoking is smoking an average of 1 pack of cigarettes a day for 1 year (for example: 1 pack a day for 30 years or 2 packs a day for 15 years). Yearly screening should continue until the smoker has stopped smoking for at least 15 years. Yearly screening should also be stopped for people who develop a health problem that would prevent them from having lung cancer treatment.  If you are pregnant, do not drink alcohol. If you are breastfeeding, be very cautious about drinking alcohol. If you are not pregnant and choose to drink alcohol, do not exceed 1 drink per day. One drink is considered to be 12 ounces (355 mL) of beer, 5 ounces (148 mL) of wine, or 1.5 ounces (44 mL) of liquor.  Avoid use of street drugs. Do not share needles with anyone. Ask for help if you need support or instructions about stopping  the use of drugs.  High blood pressure causes heart disease and increases the risk of stroke. Blood pressure should be checked at least every 1 to 2 years. Ongoing high blood pressure should be treated with medicines, if weight loss and exercise are not effective.  If you are 59 to 50 years old, ask your caregiver if you should take aspirin to prevent strokes.  Diabetes screening involves taking a blood sample to check your fasting blood sugar level. This should be done once every 3  years, after age 91, if you are within normal weight and without risk factors for diabetes. Testing should be considered at a younger age or be carried out more frequently if you are overweight and have at least 1 risk factor for diabetes.  Breast cancer screening is essential preventative care for women. You should practice "breast self-awareness." This means understanding the normal appearance and feel of your breasts and may include breast self-examination. Any changes detected, no matter how small, should be reported to a caregiver. Women in their 66s and 30s should have a clinical breast exam (CBE) by a caregiver as part of a regular health exam every 1 to 3 years. After age 101, women should have a CBE every year. Starting at age 100, women should consider having a mammogram (breast X-ray) every year. Women who have a family history of breast cancer should talk to their caregiver about genetic screening. Women at a high risk of breast cancer should talk to their caregiver about having an MRI and a mammogram every year.  Breast cancer gene (BRCA)-related cancer risk assessment is recommended for women who have family members with BRCA-related cancers. BRCA-related cancers include breast, ovarian, tubal, and peritoneal cancers. Having family members with these cancers may be associated with an increased risk for harmful changes (mutations) in the breast cancer genes BRCA1 and BRCA2. Results of the assessment will determine the need for genetic counseling and BRCA1 and BRCA2 testing.  The Pap test is a screening test for cervical cancer. Women should have a Pap test starting at age 57. Between ages 25 and 35, Pap tests should be repeated every 2 years. Beginning at age 37, you should have a Pap test every 3 years as long as the past 3 Pap tests have been normal. If you had a hysterectomy for a problem that was not cancer or a condition that could lead to cancer, then you no longer need Pap tests. If you are  between ages 50 and 76, and you have had normal Pap tests going back 10 years, you no longer need Pap tests. If you have had past treatment for cervical cancer or a condition that could lead to cancer, you need Pap tests and screening for cancer for at least 20 years after your treatment. If Pap tests have been discontinued, risk factors (such as a new sexual partner) need to be reassessed to determine if screening should be resumed. Some women have medical problems that increase the chance of getting cervical cancer. In these cases, your caregiver may recommend more frequent screening and Pap tests.  The human papillomavirus (HPV) test is an additional test that may be used for cervical cancer screening. The HPV test looks for the virus that can cause the cell changes on the cervix. The cells collected during the Pap test can be tested for HPV. The HPV test could be used to screen women aged 44 years and older, and should be used in women of any age  who have unclear Pap test results. After the age of 44, women should have HPV testing at the same frequency as a Pap test.  Colorectal cancer can be detected and often prevented. Most routine colorectal cancer screening begins at the age of 16 and continues through age 14. However, your caregiver may recommend screening at an earlier age if you have risk factors for colon cancer. On a yearly basis, your caregiver may provide home test kits to check for hidden blood in the stool. Use of a small camera at the end of a tube, to directly examine the colon (sigmoidoscopy or colonoscopy), can detect the earliest forms of colorectal cancer. Talk to your caregiver about this at age 31, when routine screening begins. Direct examination of the colon should be repeated every 5 to 10 years through age 57, unless early forms of pre-cancerous polyps or small growths are found.  Hepatitis C blood testing is recommended for all people born from [birth year] through 1966 and any  individual with known risks for hepatitis C.  Practice safe sex. Use condoms and avoid high-risk sexual practices to reduce the spread of sexually transmitted infections (STIs). Sexually active women aged 55 and younger should be checked for Chlamydia, which is a common sexually transmitted infection. Older women with new or multiple partners should also be tested for Chlamydia. Testing for other STIs is recommended if you are sexually active and at increased risk.  Osteoporosis is a disease in which the bones lose minerals and strength with aging. This can result in serious bone fractures. The risk of osteoporosis can be identified using a bone density scan. Women ages 66 and over and women at risk for fractures or osteoporosis should discuss screening with their caregivers. Ask your caregiver whether you should be taking a calcium supplement or vitamin D to reduce the rate of osteoporosis.  Menopause can be associated with physical symptoms and risks. Hormone replacement therapy is available to decrease symptoms and risks. You should talk to your caregiver about whether hormone replacement therapy is right for you.  Use sunscreen. Apply sunscreen liberally and repeatedly throughout the day. You should seek shade when your shadow is shorter than you. Protect yourself by wearing long sleeves, pants, a wide-brimmed hat, and sunglasses year round, whenever you are outdoors.  Notify your caregiver of new moles or changes in moles, especially if there is a change in shape or color. Also notify your caregiver if a mole is larger than the size of a pencil eraser.  Stay current with your immunizations. Document Released: 02/14/2011 Document Revised: 11/26/2012 Document Reviewed: 02/14/2011 [facility] Patient Information 2014 [employer].

## 2015-02-27 NOTE — Addendum Note (Signed)
Addended by: Nelva Nay on: 02/27/2015 02:57 PM   Modules accepted: Orders

## 2015-02-27 NOTE — Progress Notes (Signed)
WILLY VORCE 1966-01-05 [id number]        49 y.o.  G1P1 for annual exam.  Several issues noted below.  Past medical history,surgical history, problem list, medications, allergies, family history and social history were all reviewed and documented as reviewed in the EPIC chart.  ROS:  Performed with pertinent positives and negatives included in the history, assessment and plan.   Additional significant findings :  none   Exam: Kim Counsellor Vitals:   02/27/15 1418  BP: 120/76  Height: 5\' 5"  (1.651 m)  Weight: 191 lb (86.637 kg)   General appearance:  Normal affect, orientation and appearance. Skin: Grossly normal HEENT: Without gross lesions.  No cervical or supraclavicular adenopathy. Thyroid normal.  Lungs:  Clear without wheezing, rales or rhonchi Cardiac: RR, without RMG Abdominal:  Soft, nontender, without masses, guarding, rebound, organomegaly or hernia Breasts:  Examined lying and sitting without masses, retractions, discharge or axillary adenopathy. Pelvic:  Ext/BUS/vagina normal  Cervix normal. Pap done  Uterus anteverted, normal size, shape and contour, midline and mobile nontender   Adnexa  Without masses or tenderness    Anus and perineum  Normal   Rectovaginal  Normal sphincter tone without palpated masses or tenderness.    Assessment/Plan:  49 y.o. G1P1 female for annual exam late for menses, abstinent birth control.   1. Late for menses this month. Some mild irregularity this past year. Starting to have some hot flashes and sweats. No prolonged/atypical bleeding or intermenstrual bleeding. Will keep menstrual calendar and as long as less frequent but regular menses when they occur will monitor. If prolonged or atypical bleeding or menopausal symptoms worsen and she wants to discuss HRT she'll follow up for further evaluation and discussion. 2. Overdue for mammography and patient agrees to schedule. SBE monthly reviewed. 3. Colonoscopy scheduled and  patient will follow up for this. 4. Pap smear 2013. Pap smear done today. No history of abnormal Pap smears previously. 5. Health maintenance. No routine blood work done as this is done at her primary physician's office. Follow up in one year, sooner as needed.   Anastasio Auerbach MD, 2:53 PM 02/27/2015

## 2015-02-28 LAB — URINALYSIS W MICROSCOPIC + REFLEX CULTURE
Bilirubin Urine: NEGATIVE
Casts: NONE SEEN
GLUCOSE, UA: NEGATIVE mg/dL
Hgb urine dipstick: NEGATIVE
Ketones, ur: NEGATIVE mg/dL
Nitrite: NEGATIVE
Protein, ur: NEGATIVE mg/dL
Specific Gravity, Urine: 1.015 (ref 1.005–1.030)
UROBILINOGEN UA: 0.2 mg/dL (ref 0.0–1.0)
pH: 6 (ref 5.0–8.0)

## 2015-03-02 LAB — URINE CULTURE: Colony Count: 95000

## 2015-03-04 LAB — CYTOLOGY - PAP

## 2015-05-01 ENCOUNTER — Ambulatory Visit (AMBULATORY_SURGERY_CENTER): Payer: Self-pay

## 2015-05-01 VITALS — Ht 65.0 in | Wt 197.0 lb

## 2015-05-01 DIAGNOSIS — Z1211 Encounter for screening for malignant neoplasm of colon: Secondary | ICD-10-CM

## 2015-05-01 NOTE — Progress Notes (Signed)
No egg or soy allergy. No previous complications from anesthesia. No home O2. No diet meds. Emmi video sent to [email or web address]

## 2015-05-12 ENCOUNTER — Other Ambulatory Visit: Payer: Self-pay

## 2015-05-12 DIAGNOSIS — Z1231 Encounter for screening mammogram for malignant neoplasm of breast: Secondary | ICD-10-CM

## 2015-05-15 ENCOUNTER — Encounter: Payer: Self-pay | Admitting: Internal Medicine

## 2015-05-15 ENCOUNTER — Ambulatory Visit (AMBULATORY_SURGERY_CENTER): Payer: BC Managed Care – PPO | Admitting: Internal Medicine

## 2015-05-15 VITALS — BP 116/73 | HR 69 | Temp 96.6°F | Resp 26 | Ht 65.0 in | Wt 197.0 lb

## 2015-05-15 DIAGNOSIS — Z1211 Encounter for screening for malignant neoplasm of colon: Secondary | ICD-10-CM | POA: Diagnosis not present

## 2015-05-15 MED ORDER — SODIUM CHLORIDE 0.9 % IV SOLN: 500.0000 mL | INTRAVENOUS | Status: DC

## 2015-05-15 NOTE — Op Note (Signed)
[place]  [address] [place], [address]   COLONOSCOPY PROCEDURE REPORT  PATIENT: Samantha, Delacruz  MR#: [id number] BIRTHDATE: 02/22/1966 , 49  yrs. old GENDER: female ENDOSCOPIST: Gatha Mayer, MD, [facility] PROCEDURE DATE:  05/15/2015 PROCEDURE:   Colonoscopy, screening First Screening Colonoscopy - Avg.  risk and is 50 yrs.  old or older Yes.  Prior Negative Screening - Now for repeat screening. N/A  History of Adenoma - Now for follow-up colonoscopy & has been > or = to 3 yrs.  N/A  Polyps removed today? No Recommend repeat exam, <10 yrs? No ASA CLASS:   Class II INDICATIONS:Screening for colonic neoplasia and Colorectal Neoplasm Risk Assessment for this procedure is average risk. MEDICATIONS: Propofol 250 mg IV and Monitored anesthesia care  DESCRIPTION OF PROCEDURE:   After the risks benefits and alternatives of the procedure were thoroughly explained, informed consent was obtained.  The digital rectal exam revealed no abnormalities of the rectum.   The LB [id number]-H190 [id number]  endoscope was introduced through the anus and advanced to the cecum, which was identified by both the appendix and ileocecal valve. No adverse events experienced.   The quality of the prep was excellent. (MiraLax was used)  The instrument was then slowly withdrawn as the colon was fully examined. Estimated blood loss is zero unless otherwise noted in this procedure report.      COLON FINDINGS: There was mild diverticulosis noted in the left colon.   External hemorrhoids were found.   The examination was otherwise normal.  Retroflexed views revealed external hemorrhoids. The time to cecum = 3.2 Withdrawal time = 9.3   The scope was withdrawn and the procedure completed. COMPLICATIONS: There were no immediate complications.  ENDOSCOPIC IMPRESSION: 1.   Mild diverticulosis was noted in the left colon 2.   External hemorrhoids 3.   The examination was otherwise normal -  excellent prep  RECOMMENDATIONS: Repeat routine colonoscopy/screening test 10 years - 2026  eSigned:  Gatha Mayer, MD, [facility] 05/15/2015 10:28 AM   cc: The Patient

## 2015-05-15 NOTE — Progress Notes (Signed)
To recovery, report to Scott, RN, VSS 

## 2015-05-15 NOTE — Patient Instructions (Addendum)
No polyps!  You do have diverticulosis - thickened muscle rings and pouches in the colon wall. Please read the handout about this condition.  I appreciate the opportunity to care for you. Gatha Mayer, MD, FACG  YOU HAD AN ENDOSCOPIC PROCEDURE TODAY AT [facility]:   Refer to the procedure report that was given to you for any specific questions about what was found during the examination.  If the procedure report does not answer your questions, please call your gastroenterologist to clarify.  If you requested that your care partner not be given the details of your procedure findings, then the procedure report has been included in a sealed envelope for you to review at your convenience later.  YOU SHOULD EXPECT: Some feelings of bloating in the abdomen. Passage of more gas than usual.  Walking can help get rid of the air that was put into your GI tract during the procedure and reduce the bloating. If you had a lower endoscopy (such as a colonoscopy or flexible sigmoidoscopy) you may notice spotting of blood in your stool or on the toilet paper. If you underwent a bowel prep for your procedure, you may not have a normal bowel movement for a few days.  Please Note:  You might notice some irritation and congestion in your nose or some drainage.  This is from the oxygen used during your procedure.  There is no need for concern and it should clear up in a day or so.  SYMPTOMS TO REPORT IMMEDIATELY:   Following lower endoscopy (colonoscopy or flexible sigmoidoscopy):  Excessive amounts of blood in the stool  Significant tenderness or worsening of abdominal pains  Swelling of the abdomen that is new, acute  Fever of 100F or higher   Following upper endoscopy (EGD)  Vomiting of blood or coffee ground material  New chest pain or pain under the shoulder blades  Painful or persistently difficult swallowing  New shortness of breath  Fever of 100F or higher  Black,  tarry-looking stools  For urgent or emergent issues, a gastroenterologist can be reached at any hour by calling [phone].   DIET: Your first meal following the procedure should be a small meal and then it is ok to progress to your normal diet. Heavy or fried foods are harder to digest and may make you feel nauseous or bloated.  Likewise, meals heavy in dairy and vegetables can increase bloating.  Drink plenty of fluids but you should avoid alcoholic beverages for 24 hours.  ACTIVITY:  You should plan to take it easy for the rest of today and you should NOT DRIVE or use heavy machinery until tomorrow (because of the sedation medicines used during the test).    FOLLOW UP: Our staff will call the number listed on your records the next business day following your procedure to check on you and address any questions or concerns that you may have regarding the information given to you following your procedure. If we do not reach you, we will leave a message.  However, if you are feeling well and you are not experiencing any problems, there is no need to return our call.  We will assume that you have returned to your regular daily activities without incident.  If any biopsies were taken you will be contacted by phone or by letter within the next 1-3 weeks.  Please call us at [phone] if you have not heard about the biopsies in 3 weeks.  SIGNATURES/CONFIDENTIALITY: You and/or your care partner have signed paperwork which will be entered into your electronic medical record.  These signatures attest to the fact that that the information above on your After Visit Summary has been reviewed and is understood.  Full responsibility of the confidentiality of this discharge information lies with you and/or your care-partner.  Diverticulosis information given.

## 2015-05-16 ENCOUNTER — Telehealth: Payer: Self-pay | Admitting: Gastroenterology

## 2015-05-16 NOTE — Telephone Encounter (Signed)
Had colonoscopy yesterday and developed gas pains after dinner, now subsiding.  Told to call office if pain increases.

## 2015-05-18 ENCOUNTER — Telehealth: Payer: Self-pay

## 2015-05-18 NOTE — Telephone Encounter (Signed)
Left a message at [phone] for the pt to call us back if any questions or concerns. maw

## 2015-06-02 ENCOUNTER — Ambulatory Visit (INDEPENDENT_AMBULATORY_CARE_PROVIDER_SITE_OTHER): Payer: BC Managed Care – PPO | Admitting: Women's Health

## 2015-06-02 ENCOUNTER — Encounter: Payer: Self-pay | Admitting: Women's Health

## 2015-06-02 VITALS — BP 126/80 | Ht 65.0 in | Wt 191.0 lb

## 2015-06-02 DIAGNOSIS — N921 Excessive and frequent menstruation with irregular cycle: Secondary | ICD-10-CM

## 2015-06-02 MED ORDER — MEGESTROL ACETATE 40 MG PO TABS: 40.0000 mg | ORAL_TABLET | Freq: Two times a day (BID) | ORAL | Status: DC

## 2015-06-02 NOTE — Patient Instructions (Signed)
Endometrial Ablation Endometrial ablation removes the lining of the uterus (endometrium). It is usually a same-day, outpatient treatment. Ablation helps avoid major surgery, such as surgery to remove the cervix and uterus (hysterectomy). After endometrial ablation, you will have little or no menstrual bleeding and may not be able to have children. However, if you are premenopausal, you will need to use a reliable method of birth control following the procedure because of the small chance that pregnancy can occur. There are different reasons to have this procedure. These reasons include:  Heavy periods.  Bleeding that is causing anemia.  Irregular bleeding.  Bleeding fibroids on the lining inside the uterus if they are smaller than 3 centimeters. This procedure may not be possible for you if:   You want to have children in the future.   You have severe cramps with your menstrual period.   You have precancerous or cancerous cells in your uterus.   You were recently pregnant.   You have gone through menopause.   You have had major surgery on your uterus, resulting in thinning of the uterine wall. Surgeries may include:  The removal of one or more uterine fibroids (myomectomy).  A cesarean section with a classic (vertical) incision on your uterus. Ask your health care provider what type of cesarean you had. Sometimes the scar on your skin is different than the scar on your uterus. Even if you have had surgery on your uterus, certain types of ablation may still be safe for you. Talk with your health care provider. LET YOUR HEALTH CARE PROVIDER KNOW ABOUT:  Any allergies you have.  All medicines you are taking, including vitamins, herbs, eye drops, creams, and over-the-counter medicines.  Previous problems you or members of your family have had with the use of anesthetics.  Any blood disorders you have.  Previous surgeries you have had.  Medical conditions you have. RISKS AND  COMPLICATIONS  Generally, this is a safe procedure. However, as with any procedure, complications can occur. Possible complications include:  Perforation of the uterus.  Bleeding.  Infection of the uterus, bladder, or vagina.  Injury to surrounding organs.  An air bubble to the lung (air embolus).  Pregnancy following the procedure.  Failure of the procedure to help the problem, requiring hysterectomy.  Decreased ability to diagnose cancer in the lining of the uterus. BEFORE THE PROCEDURE  The lining of the uterus must be tested to make sure there is no pre-cancerous or cancer cells present.  An ultrasound may be performed to look at the size of the uterus and to check for abnormalities.  Medicines may be given to thin the lining of the uterus. PROCEDURE  During the procedure, your health care provider will use a tool called a resectoscope to help see inside your uterus. There are different ways to remove the lining of your uterus.   Radiofrequency - This method uses a radiofrequency-alternating electric current to remove the lining of the uterus.  Cryotherapy - This method uses extreme cold to freeze the lining of the uterus.  Heated-Free Liquid - This method uses heated salt (saline) solution to remove the lining of the uterus.  Microwave - This method uses high-energy microwaves to heat up the lining of the uterus to remove it.  Thermal balloon - This method involves inserting a catheter with a balloon tip into the uterus. The balloon tip is filled with heated fluid to remove the lining of the uterus. AFTER THE PROCEDURE  After your procedure, do   not have sexual intercourse or insert anything into your vagina until permitted by your health care provider. After the procedure, you may experience:  Cramps.  Vaginal discharge.  Frequent urination.   This information is not intended to replace advice given to you by your health care provider. Make sure you discuss any  questions you have with your health care provider.   Document Released: 06/10/2004 Document Revised: 04/22/2015 Document Reviewed: 01/02/2013 Elsevier Interactive Patient Education 2016 [employer]. Menorrhagia Menorrhagia is the medical term for when your menstrual periods are heavy or last longer than usual. With menorrhagia, every period you have may cause enough blood loss and cramping that you are unable to maintain your usual activities. CAUSES  In some cases, the cause of heavy periods is unknown, but a number of conditions may cause menorrhagia. Common causes include:  A problem with the hormone-producing thyroid gland (hypothyroid).  Noncancerous growths in the uterus (polyps or fibroids).  An imbalance of the estrogen and progesterone hormones.  One of your ovaries not releasing an egg during one or more months.  Side effects of having an intrauterine device (IUD).  Side effects of some medicines, such as anti-inflammatory medicines or blood thinners.  A bleeding disorder that stops your blood from clotting normally. SIGNS AND SYMPTOMS  During a normal period, bleeding lasts between 4 and 8 days. Signs that your periods are too heavy include:  You routinely have to change your pad or tampon every 1 or 2 hours because it is completely soaked.  You pass blood clots larger than 1 inch (2.5 cm) in size.  You have bleeding for more than 7 days.  You need to use pads and tampons at the same time because of heavy bleeding.  You need to wake up to change your pads or tampons during the night.  You have symptoms of anemia, such as tiredness, fatigue, or shortness of breath. DIAGNOSIS  Your health care provider will perform a physical exam and ask you questions about your symptoms and menstrual history. Other tests may be ordered based on what the health care provider finds during the exam. These tests can include:  Blood tests. Blood tests are used to check if you are  pregnant or have hormonal changes, a bleeding or thyroid disorder, low iron levels (anemia), or other problems.  Endometrial biopsy. Your health care provider takes a sample of tissue from the inside of your uterus to be examined under a microscope.  Pelvic ultrasound. This test uses sound waves to make a picture of your uterus, ovaries, and vagina. The pictures can show if you have fibroids or other growths.  Hysteroscopy. For this test, your health care provider will use a small telescope to look inside your uterus. Based on the results of your initial tests, your health care provider may recommend further testing. TREATMENT  Treatment may not be needed. If it is needed, your health care provider may recommend treatment with one or more medicines first. If these do not reduce bleeding enough, a surgical treatment might be an option. The best treatment for you will depend on:   Whether you need to prevent pregnancy.  Your desire to have children in the future.  The cause and severity of your bleeding.  Your opinion and personal preference.  Medicines for menorrhagia may include:  Birth control methods that use hormones. These include the pill, skin patch, vaginal ring, shots that you get every 3 months, hormonal IUD, and implant. These treatments reduce bleeding  during your menstrual period.  Medicines that thicken blood and slow bleeding.  Medicines that reduce swelling, such as ibuprofen.  Medicines that contain a synthetic hormone called progestin.   Medicines that make the ovaries stop working for a short time.  You may need surgical treatment for menorrhagia if the medicines are unsuccessful. Treatment options include:  Dilation and curettage (D&C). In this procedure, your health care provider opens (dilates) your cervix and then scrapes or suctions tissue from the lining of your uterus to reduce menstrual bleeding.  Operative hysteroscopy. This procedure uses a tiny tube  with a light (hysteroscope) to view your uterine cavity and can help in the surgical removal of a polyp that may be causing heavy periods.  Endometrial ablation. Through various techniques, your health care provider permanently destroys the entire lining of your uterus (endometrium). After endometrial ablation, most women have little or no menstrual flow. Endometrial ablation reduces your ability to become pregnant.  Endometrial resection. This surgical procedure uses an electrosurgical wire loop to remove the lining of the uterus. This procedure also reduces your ability to become pregnant.  Hysterectomy. Surgical removal of the uterus and cervix is a permanent procedure that stops menstrual periods. Pregnancy is not possible after a hysterectomy. This procedure requires anesthesia and hospitalization. HOME CARE INSTRUCTIONS   Only take over-the-counter or prescription medicines as directed by your health care provider. Take prescribed medicines exactly as directed. Do not change or switch medicines without consulting your health care provider.  Take any prescribed iron pills exactly as directed by your health care provider. Long-term heavy bleeding may result in low iron levels. Iron pills help replace the iron your body lost from heavy bleeding. Iron may cause constipation. If this becomes a problem, increase the bran, fruits, and roughage in your diet.  Do not take aspirin or medicines that contain aspirin 1 week before or during your menstrual period. Aspirin may make the bleeding worse.  If you need to change your sanitary pad or tampon more than once every 2 hours, stay in bed and rest as much as possible until the bleeding stops.  Eat well-balanced meals. Eat foods high in iron. Examples are leafy green vegetables, meat, liver, eggs, and whole grain breads and cereals. Do not try to lose weight until the abnormal bleeding has stopped and your blood iron level is back to normal. SEEK MEDICAL  CARE IF:   You soak through a pad or tampon every 1 or 2 hours, and this happens every time you have a period.  You need to use pads and tampons at the same time because you are bleeding so much.  You need to change your pad or tampon during the night.  You have a period that lasts for more than 8 days.  You pass clots bigger than 1 inch wide.  You have irregular periods that happen more or less often than once a month.  You feel dizzy or faint.  You feel very weak or tired.  You feel short of breath or feel your heart is beating too fast when you exercise.  You have nausea and vomiting or diarrhea while you are taking your medicine.  You have any problems that may be related to the medicine you are taking. SEEK IMMEDIATE MEDICAL CARE IF:   You soak through 4 or more pads or tampons in 2 hours.  You have any bleeding while you are pregnant. MAKE SURE YOU:   Understand these instructions.  Will watch your  condition.  Will get help right away if you are not doing well or get worse.   This information is not intended to replace advice given to you by your health care provider. Make sure you discuss any questions you have with your health care provider.   Document Released: 08/01/2005 Document Revised: 08/06/2013 Document Reviewed: 01/20/2013 Elsevier Interactive Patient Education 2016 [employer]. Levonorgestrel intrauterine device (IUD) What is this medicine? LEVONORGESTREL IUD (LEE voe nor jes trel) is a contraceptive (birth control) device. The device is placed inside the uterus by a healthcare professional. It is used to prevent pregnancy and can also be used to treat heavy bleeding that occurs during your period. Depending on the device, it can be used for 3 to 5 years. This medicine may be used for other purposes; ask your health care provider or pharmacist if you have questions. What should I tell my health care provider before I take this medicine? They need to  know if you have any of these conditions: -abnormal Pap smear -cancer of the breast, uterus, or cervix -diabetes -endometritis -genital or pelvic infection now or in the past -have more than one sexual partner or your partner has more than one partner -heart disease -history of an ectopic or tubal pregnancy -immune system problems -IUD in place -liver disease or tumor -problems with blood clots or take blood-thinners -use intravenous drugs -uterus of unusual shape -vaginal bleeding that has not been explained -an unusual or allergic reaction to levonorgestrel, other hormones, silicone, or polyethylene, medicines, foods, dyes, or preservatives -pregnant or trying to get pregnant -breast-feeding How should I use this medicine? This device is placed inside the uterus by a health care professional. Talk to your pediatrician regarding the use of this medicine in children. Special care may be needed. Overdosage: If you think you have taken too much of this medicine contact a poison control center or emergency room at once. NOTE: This medicine is only for you. Do not share this medicine with others. What if I miss a dose? This does not apply. What may interact with this medicine? Do not take this medicine with any of the following medications: -amprenavir -bosentan -fosamprenavir This medicine may also interact with the following medications: -aprepitant -barbiturate medicines for inducing sleep or treating seizures -bexarotene -griseofulvin -medicines to treat seizures like carbamazepine, ethotoin, felbamate, oxcarbazepine, phenytoin, topiramate -modafinil -pioglitazone -rifabutin -rifampin -rifapentine -some medicines to treat HIV infection like atazanavir, indinavir, lopinavir, nelfinavir, tipranavir, ritonavir -St. John's wort -warfarin This list may not describe all possible interactions. Give your health care provider a list of all the medicines, herbs, non-prescription  drugs, or dietary supplements you use. Also tell them if you smoke, drink alcohol, or use illegal drugs. Some items may interact with your medicine. What should I watch for while using this medicine? Visit your doctor or health care professional for regular check ups. See your doctor if you or your partner has sexual contact with others, becomes HIV positive, or gets a sexual transmitted disease. This product does not protect you against HIV infection (AIDS) or other sexually transmitted diseases. You can check the placement of the IUD yourself by reaching up to the top of your vagina with clean fingers to feel the threads. Do not pull on the threads. It is a good habit to check placement after each menstrual period. Call your doctor right away if you feel more of the IUD than just the threads or if you cannot feel the threads at all. The  IUD may come out by itself. You may become pregnant if the device comes out. If you notice that the IUD has come out use a backup birth control method like condoms and call your health care provider. Using tampons will not change the position of the IUD and are okay to use during your period. What side effects may I notice from receiving this medicine? Side effects that you should report to your doctor or health care professional as soon as possible: -allergic reactions like skin rash, itching or hives, swelling of the face, lips, or tongue -fever, flu-like symptoms -genital sores -high blood pressure -no menstrual period for 6 weeks during use -pain, swelling, warmth in the leg -pelvic pain or tenderness -severe or sudden headache -signs of pregnancy -stomach cramping -sudden shortness of breath -trouble with balance, talking, or walking -unusual vaginal bleeding, discharge -yellowing of the eyes or skin Side effects that usually do not require medical attention (report to your doctor or health care professional if they continue or are  bothersome): -acne -breast pain -change in sex drive or performance -changes in weight -cramping, dizziness, or faintness while the device is being inserted -headache -irregular menstrual bleeding within first 3 to 6 months of use -nausea This list may not describe all possible side effects. Call your doctor for medical advice about side effects. You may report side effects to FDA at 1-800-FDA-1088. Where should I keep my medicine? This does not apply. NOTE: This sheet is a summary. It may not cover all possible information. If you have questions about this medicine, talk to your doctor, pharmacist, or health care provider.    2016, Elsevier/[employer] Standard. (2011-09-01 13:54:04)

## 2015-06-02 NOTE — Progress Notes (Signed)
Patient ID: Samantha Delacruz, female   DOB: June 07, 1966, 49 y.o.   MRN: [id number] Presents with complaint of heavy cycle, on day 5 changing tampon every hour with no apparent slowing of flow. Increased cramps. Last cycle lasted 14 days with heavy flow and clots. Not sexually active/husband in poor health. Cycles monthly, occasional skipped month. Denies menopausal symptoms. Denies discharge, urinary symptoms, fever. Negative colonoscopy last month. 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 Mother and sister both menorrhagia. Normal TSH 12/2014.  Exam: Appears well. External genitalia within normal limits, speculum exam copious menses type blood noted, no polyp or lesion on cervix, bimanual no CMT or adnexal tenderness.  Menorrhagia  Plan: Megace 40 mg twice a day prescription, proper use given and reviewed instructed to call if bleeding does not stop. Sonohysterogram with Dr. Phineas Real after bleeding subsides. Options of Mirena IUD or Her option ablation reviewed.

## 2015-06-05 ENCOUNTER — Telehealth: Payer: Self-pay | Admitting: *Deleted

## 2015-06-05 MED ORDER — METRONIDAZOLE 0.75 % VA GEL: VAGINAL | Status: DC

## 2015-06-05 NOTE — Telephone Encounter (Signed)
Pt saw had OV on 06/02/15 states bleeding has slowed down, never started the megace 40 mg. Pt is however c/o fishy odor asked if Rx could be sent to pharmacy, I advised OV best with infection. Please advise

## 2015-06-05 NOTE — Telephone Encounter (Signed)
metrogel vag cm 1 appl hs for 5 nights, no alcohol  while on, OV if no relief

## 2015-06-05 NOTE — Telephone Encounter (Signed)
Left on pt voicemail Rx sent. 

## 2015-06-15 ENCOUNTER — Ambulatory Visit
Admission: RE | Admit: 2015-06-15 | Discharge: 2015-06-15 | Disposition: A | Discharge status: Home / Self Care | Payer: BC Managed Care – PPO | Source: Ambulatory Visit

## 2015-06-15 DIAGNOSIS — Z1231 Encounter for screening mammogram for malignant neoplasm of breast: Secondary | ICD-10-CM

## 2015-07-29 ENCOUNTER — Other Ambulatory Visit: Payer: Self-pay | Admitting: Occupational Medicine

## 2015-07-29 ENCOUNTER — Ambulatory Visit: Payer: Self-pay

## 2015-07-29 ENCOUNTER — Emergency Department (HOSPITAL_COMMUNITY): Admission: EM | Admit: 2015-07-29 | Discharge: 2015-07-29 | Discharge status: Absent without leave | Payer: Self-pay

## 2015-07-29 DIAGNOSIS — M25571 Pain in right ankle and joints of right foot: Secondary | ICD-10-CM

## 2015-09-24 ENCOUNTER — Encounter: Payer: Self-pay | Admitting: Adult Health

## 2015-09-24 ENCOUNTER — Ambulatory Visit (INDEPENDENT_AMBULATORY_CARE_PROVIDER_SITE_OTHER): Payer: BC Managed Care – PPO | Admitting: Adult Health

## 2015-09-24 VITALS — BP 120/78 | Temp 98.6°F | Ht 65.0 in | Wt 197.9 lb

## 2015-09-24 DIAGNOSIS — J029 Acute pharyngitis, unspecified: Secondary | ICD-10-CM | POA: Diagnosis not present

## 2015-09-24 LAB — POCT RAPID STREP A (OFFICE): Rapid Strep A Screen: NEGATIVE

## 2015-09-24 MED ORDER — PENICILLIN G BENZATHINE 1200000 UNIT/2ML IM SUSP
1.2000 10*6.[IU] | Freq: Once | INTRAMUSCULAR | Status: AC
  Administered 2015-09-24: 1.2 10*6.[IU] via INTRAMUSCULAR

## 2015-09-24 MED ORDER — MAGIC MOUTHWASH W/LIDOCAINE: 5.0000 mL | Freq: Three times a day (TID) | ORAL | Status: DC | PRN

## 2015-09-24 NOTE — Progress Notes (Signed)
Pre visit review using our clinic review tool, if applicable. No additional management support is needed unless otherwise documented below in the visit note. 

## 2015-09-24 NOTE — Progress Notes (Signed)
   Subjective:    Patient ID: Samantha Delacruz, female    DOB: [birth year], 50 y.o.   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 She reports that many of her students have been testing positive for strep.   She has had a sore throat since Tuesday. She reports that she has a hard time swallowing.   She has been using Ibuprofen which moderate success with the pain  She feels fatigued. No fevers, no cough.   Review of Systems  Constitutional: Positive for fatigue. Negative for fever, chills and appetite change.  HENT: Positive for ear pain, sore throat and trouble swallowing. Negative for ear discharge, postnasal drip, rhinorrhea, sinus pressure and voice change.   Respiratory: Negative for cough, shortness of breath and wheezing.   Hematological: Positive for adenopathy.       Objective:   Physical Exam  Constitutional: She is oriented to person, place, and time. She appears well-developed and well-nourished. No distress.  HENT:  Head: Normocephalic and atraumatic.  Right Ear: External ear normal.  Left Ear: External ear normal.  Nose: Nose normal.  Mouth/Throat: Oropharyngeal exudate present.  Neck: Normal range of motion. Neck supple.  Cardiovascular: Normal rate, regular rhythm, normal heart sounds and intact distal pulses.  Exam reveals no gallop and no friction rub.   No murmur heard. Pulmonary/Chest: Effort normal and breath sounds normal. No respiratory distress. She has no wheezes. She has no rales. She exhibits no tenderness.  Lymphadenopathy:    She has cervical adenopathy.       Right cervical: Superficial cervical and deep cervical adenopathy present.       Left cervical: Superficial cervical and deep cervical adenopathy present.  Neurological: She is alert and oriented to person, place, and time.  Skin: Skin is warm and dry. No rash noted. She is not diaphoretic. No erythema. No pallor.   Psychiatric: She has a normal mood and affect. Her behavior is normal. Judgment and thought content normal.  Nursing note and vitals reviewed.      Assessment & Plan:  1. Sore throat - POC Rapid Strep A- Negative. Will send culture. She does have exudate and swollen lymph nodes. I will treat her for strep - penicillin g benzathine (BICILLIN LA) 1200000 UNIT/2ML injection 1.2 Million Units; Inject 2 mLs (1.2 Million Units total) into the muscle once. - magic mouthwash w/lidocaine SOLN; Take 5 mLs by mouth 3 (three) times daily as needed for mouth pain.  Dispense: 180 mL; Refill: 0

## 2015-09-24 NOTE — Patient Instructions (Addendum)
It was great meeting you today!  Your rapid strep was negative but I am going to send a culture and treat you anyways due to your symptoms.   Also, use the magic mouthwash as needed for throat pain.   Follow up if no improvement.   Strep Throat Strep throat is a bacterial infection of the throat. Your health care provider may call the infection tonsillitis or pharyngitis, depending on whether there is swelling in the tonsils or at the back of the throat. Strep throat is most common during the cold months of the year in children who are 51-37 years of age, but it can happen during any season in people of any age. This infection is spread from person to person (contagious) through coughing, sneezing, or close contact. CAUSES Strep throat is caused by the bacteria called Streptococcus pyogenes. RISK FACTORS This condition is more likely to develop in:  People who spend time in crowded places where the infection can spread easily.  People who have close contact with someone who has strep throat. SYMPTOMS Symptoms of this condition include:  Fever or chills.   Redness, swelling, or pain in the tonsils or throat.  Pain or difficulty when swallowing.  White or yellow spots on the tonsils or throat.  Swollen, tender glands in the neck or under the jaw.  Red rash all over the body (rare). DIAGNOSIS This condition is diagnosed by performing a rapid strep test or by taking a swab of your throat (throat culture test). Results from a rapid strep test are usually ready in a few minutes, but throat culture test results are available after one or two days. TREATMENT This condition is treated with antibiotic medicine. HOME CARE INSTRUCTIONS Medicines  Take over-the-counter and prescription medicines only as told by your health care provider.  Take your antibiotic as told by your health care provider. Do not stop taking the antibiotic even if you start to feel better.  Have family members  who also have a sore throat or fever tested for strep throat. They may need antibiotics if they have the strep infection. Eating and Drinking  Do not share food, drinking cups, or personal items that could cause the infection to spread to other people.  If swallowing is difficult, try eating soft foods until your sore throat feels better.  Drink enough fluid to keep your urine clear or pale yellow. General Instructions  Gargle with a salt-water mixture 3-4 times per day or as needed. To make a salt-water mixture, completely dissolve -1 tsp of salt in 1 cup of warm water.  Make sure that all household members wash their hands well.  Get plenty of rest.  Stay home from school or work until you have been taking antibiotics for 24 hours.  Keep all follow-up visits as told by your health care provider. This is important. SEEK MEDICAL CARE IF:  The glands in your neck continue to get bigger.  You develop a rash, cough, or earache.  You cough up a thick liquid that is green, yellow-brown, or bloody.  You have pain or discomfort that does not get better with medicine.  Your problems seem to be getting worse rather than better.  You have a fever. SEEK IMMEDIATE MEDICAL CARE IF:  You have new symptoms, such as vomiting, severe headache, stiff or painful neck, chest pain, or shortness of breath.  You have severe throat pain, drooling, or changes in your voice.  You have swelling of the neck, or the  skin on the neck becomes red and tender.  You have signs of dehydration, such as fatigue, dry mouth, and decreased urination.  You become increasingly sleepy, or you cannot wake up completely.  Your joints become red or painful.   This information is not intended to replace advice given to you by your health care provider. Make sure you discuss any questions you have with your health care provider.   Document Released: 07/29/2000 Document Revised: 04/22/2015 Document Reviewed:  11/24/2014 Elsevier Interactive Patient Education [employer].

## 2015-09-26 LAB — CULTURE, GROUP A STREP: ORGANISM ID, BACTERIA: NORMAL

## 2015-10-20 ENCOUNTER — Ambulatory Visit (INDEPENDENT_AMBULATORY_CARE_PROVIDER_SITE_OTHER): Payer: BC Managed Care – PPO | Admitting: Adult Health

## 2015-10-20 ENCOUNTER — Encounter: Payer: Self-pay | Admitting: Adult Health

## 2015-10-20 VITALS — BP 140/100 | HR 101 | Temp 97.8°F | Wt 193.1 lb

## 2015-10-20 DIAGNOSIS — J45901 Unspecified asthma with (acute) exacerbation: Secondary | ICD-10-CM

## 2015-10-20 MED ORDER — PREDNISONE 20 MG PO TABS: 20.0000 mg | ORAL_TABLET | Freq: Every day | ORAL | Status: DC

## 2015-10-20 MED ORDER — BENZONATATE 200 MG PO CAPS: 200.0000 mg | ORAL_CAPSULE | Freq: Two times a day (BID) | ORAL | Status: DC | PRN

## 2015-10-20 MED ORDER — IPRATROPIUM-ALBUTEROL 0.5-2.5 (3) MG/3ML IN SOLN
3.0000 mL | Freq: Once | RESPIRATORY_TRACT | Status: AC
  Administered 2015-10-20: 3 mL via RESPIRATORY_TRACT

## 2015-10-20 NOTE — Progress Notes (Signed)
Subjective:    Patient ID: Samantha Delacruz, female    DOB: [birth year], 50 y.o.   MRN: [id number]  HPI  50 year old female who presents to the office today for " severe cough" and wheezing with episodes of shortness of breath for 5 days. She reports that the cough is dry. She has been using her inhalers as directed but continues to feel as though she is wheezing and cannot get the inhaler medication deep into her lungs.   She had a fever three days ago but has not had any since.   Review of Systems  Constitutional: Positive for activity change and fatigue. Negative for fever and chills.  Respiratory: Positive for cough, shortness of breath and wheezing.   Cardiovascular: Negative.   Neurological: Negative.   Psychiatric/Behavioral: Negative.   All other systems reviewed and are negative.    Past Medical History  Diagnosis Date  . Adult ADHD   . Rosacea   . Vertigo   . Hemorrhoids, internal, with bleeding and mucous discharge - Grade 1 02/05/2014    Social History   Social History  . Marital Status: Married    Spouse Name: N/A  . Number of Children: 1  . Years of Education: N/A   Occupational History  .  [employer] History Main Topics  . Smoking status: Never Smoker   . Smokeless tobacco: Never Used  . Alcohol Use: 0.0 oz/week    0 Standard drinks or equivalent per week     Comment: occ  . Drug Use: No  . Sexual Activity: Not Currently     Comment: 1st intercourse 50 yo-Fewer than 5 partners   Other Topics Concern  . 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 Cesarean section  08/15/92    son    Family History  Problem Relation Age of Onset  . Depression Mother   . Diabetes Mother   . Emphysema Mother   . Stroke Mother   .  Cancer Mother     uterine   . Heart disease Father   . Heart failure Father   . Thyroid disease Sister   . Thyroid disease Brother   . ADD / ADHD Son   . Stroke Maternal Grandmother   . Cancer Paternal Grandfather     esophgus   . Colon cancer Neg Hx     Allergies  Allergen Reactions  . Latex Rash  . Flu Virus Vaccine Nausea And Vomiting  . Codeine Hives  . Lactose Intolerance (Gi)     Gets diarrhea and cramping  . Sulfa Antibiotics     hives    Current Outpatient Prescriptions on File Prior to Visit  Medication Sig Dispense Refill  . albuterol (PROVENTIL HFA;VENTOLIN HFA) 108 (90 BASE) MCG/ACT inhaler Inhale 2 puffs into the lungs every 4 (four) hours as needed for wheezing or shortness of breath. 1 Inhaler 2  . Fluticasone-Salmeterol (ADVAIR DISKUS) 250-50 MCG/DOSE AEPB Inhale 1 puff into the lungs 2 (two) times daily. 60 each 11  . ibuprofen (ADVIL,MOTRIN) 200 MG tablet Take 600 mg by mouth every 6 (six) hours as needed for moderate pain.    Marland Kitchen loratadine (CLARITIN) 10  MG tablet Take 10 mg by mouth daily.      . magic mouthwash w/lidocaine SOLN Take 5 mLs by mouth 3 (three) times daily as needed for mouth pain. 180 mL 0  . megestrol (MEGACE) 40 MG tablet Take 1 tablet (40 mg total) by mouth 2 (two) times daily. 20 tablet 1  . Misc Natural Products (ADVANCED JOINT RELIEF PO) Take 40 mg by mouth once.    . Multiple Vitamin (MULTIVITAMIN) tablet Take 1 tablet by mouth daily.     No current facility-administered medications on file prior to visit.    BP 140/100 mmHg  Pulse 101  Temp(Src) 97.8 F (36.6 C) (Oral)  Wt 193 lb 1.6 oz (87.59 kg)  SpO2 95%       Objective:   Physical Exam  Constitutional: She appears well-developed and well-nourished. No distress.  Cardiovascular: Normal rate, regular rhythm, normal heart sounds and intact distal pulses.  Exam reveals no gallop and no friction rub.   No murmur heard. Pulmonary/Chest: Effort normal. No respiratory distress.  She has no decreased breath sounds. She has wheezes in the right upper field, the right middle field, the right lower field, the left upper field, the left middle field and the left lower field. She has no rhonchi. She has no rales. She exhibits no tenderness.  Skin: She is not diaphoretic.  Vitals reviewed.      Assessment & Plan:  1. Asthma exacerbation - benzonatate (TESSALON) 200 MG capsule; Take 200 mg by mouth 3 (three) times daily as needed for cough. - ipratropium-albuterol (DUONEB) 0.5-2.5 (3) MG/3ML nebulizer solution 3 mL; Take 3 mLs by nebulization once. - predniSONE (DELTASONE) 20 MG tablet; Take 1 tablet (20 mg total) by mouth daily with breakfast. 40 mg x 4 days, 20 mg x 2 days, 10 mg x 2 days  Dispense: 22 tablet; Refill: 0 - Wheezing resolved after breathing treatment. She endorsed feeling as though she can breath easier.

## 2015-10-20 NOTE — Progress Notes (Signed)
Pre visit review using our clinic review tool, if applicable. No additional management support is needed unless otherwise documented below in the visit note. 

## 2015-10-20 NOTE — Patient Instructions (Addendum)
I have sent in a prescription for prednisone, take as directed  40 mg x 4 days 20 mg x 2 days 10 mg x 2 days.   Continue to use your inhalers as directed.   If you need anything, please let me know.   Asthma, Adult Asthma is a recurring condition in which the airways tighten and narrow. Asthma can make it difficult to breathe. It can cause coughing, wheezing, and shortness of breath. Asthma episodes, also called asthma attacks, range from minor to life-threatening. Asthma cannot be cured, but medicines and lifestyle changes can help control it. CAUSES Asthma is believed to be caused by inherited (genetic) and environmental factors, but its exact cause is unknown. Asthma may be triggered by allergens, lung infections, or irritants in the air. Asthma triggers are different for each person. Common triggers include:   Animal dander.  Dust mites.  Cockroaches.  Pollen from trees or grass.  Mold.  Smoke.  Air pollutants such as dust, household cleaners, hair sprays, aerosol sprays, paint fumes, strong chemicals, or strong odors.  Cold air, weather changes, and winds (which increase molds and pollens in the air).  Strong emotional expressions such as crying or laughing hard.  Stress.  Certain medicines (such as aspirin) or types of drugs (such as beta-blockers).  Sulfites in foods and drinks. Foods and drinks that may contain sulfites include dried fruit, potato chips, and sparkling grape juice.  Infections or inflammatory conditions such as the flu, a cold, or an inflammation of the nasal membranes (rhinitis).  Gastroesophageal reflux disease (GERD).  Exercise or strenuous activity. SYMPTOMS Symptoms may occur immediately after asthma is triggered or many hours later. Symptoms include:  Wheezing.  Excessive nighttime or early morning coughing.  Frequent or severe coughing with a common cold.  Chest tightness.  Shortness of breath. DIAGNOSIS  The diagnosis of asthma  is made by a review of your medical history and a physical exam. Tests may also be performed. These may include:  Lung function studies. These tests show how much air you breathe in and out.  Allergy tests.  Imaging tests such as X-rays. TREATMENT  Asthma cannot be cured, but it can usually be controlled. Treatment involves identifying and avoiding your asthma triggers. It also involves medicines. There are 2 classes of medicine used for asthma treatment:   Controller medicines. These prevent asthma symptoms from occurring. They are usually taken every day.  Reliever or rescue medicines. These quickly relieve asthma symptoms. They are used as needed and provide short-term relief. Your health care provider will help you create an asthma action plan. An asthma action plan is a written plan for managing and treating your asthma attacks. It includes a list of your asthma triggers and how they may be avoided. It also includes information on when medicines should be taken and when their dosage should be changed. An action plan may also involve the use of a device called a peak flow meter. A peak flow meter measures how well the lungs are working. It helps you monitor your condition. HOME CARE INSTRUCTIONS   Take medicines only as directed by your health care provider. Speak with your health care provider if you have questions about how or when to take the medicines.  Use a peak flow meter as directed by your health care provider. Record and keep track of readings.  Understand and use the action plan to help minimize or stop an asthma attack without needing to seek medical care.  Control  your home environment in the following ways to help prevent asthma attacks:  Do not smoke. Avoid being exposed to secondhand smoke.  Change your heating and air conditioning filter regularly.  Limit your use of fireplaces and wood stoves.  Get rid of pests (such as roaches and mice) and their  droppings.  Throw away plants if you see mold on them.  Clean your floors and dust regularly. Use unscented cleaning products.  Try to have someone else vacuum for you regularly. Stay out of rooms while they are being vacuumed and for a short while afterward. If you vacuum, use a dust mask from a hardware store, a double-layered or microfilter vacuum cleaner bag, or a vacuum cleaner with a HEPA filter.  Replace carpet with wood, tile, or vinyl flooring. Carpet can trap dander and dust.  Use allergy-proof pillows, mattress covers, and box spring covers.  Wash bed sheets and blankets every week in hot water and dry them in a dryer.  Use blankets that are made of polyester or cotton.  Clean bathrooms and kitchens with bleach. If possible, have someone repaint the walls in these rooms with mold-resistant paint. Keep out of the rooms that are being cleaned and painted.  Wash hands frequently. SEEK MEDICAL CARE IF:   You have wheezing, shortness of breath, or a cough even if taking medicine to prevent attacks.  The colored mucus you cough up (sputum) is thicker than usual.  Your sputum changes from clear or white to yellow, green, gray, or bloody.  You have any problems that may be related to the medicines you are taking (such as a rash, itching, swelling, or trouble breathing).  You are using a reliever medicine more than 2-3 times per week.  Your peak flow is still at 50-79% of your personal best after following your action plan for 1 hour.  You have a fever. SEEK IMMEDIATE MEDICAL CARE IF:   You seem to be getting worse and are unresponsive to treatment during an asthma attack.  You are short of breath even at rest.  You get short of breath when doing very little physical activity.  You have difficulty eating, drinking, or talking due to asthma symptoms.  You develop chest pain.  You develop a fast heartbeat.  You have a bluish color to your lips or fingernails.  You  are light-headed, dizzy, or faint.  Your peak flow is less than 50% of your personal best.   This information is not intended to replace advice given to you by your health care provider. Make sure you discuss any questions you have with your health care provider.   Document Released: 08/01/2005 Document Revised: 04/22/2015 Document Reviewed: 02/28/2013 Elsevier Interactive Patient Education [employer].

## 2015-12-08 ENCOUNTER — Encounter: Payer: Self-pay | Admitting: Family Medicine

## 2015-12-08 ENCOUNTER — Ambulatory Visit (INDEPENDENT_AMBULATORY_CARE_PROVIDER_SITE_OTHER): Payer: BC Managed Care – PPO | Admitting: Family Medicine

## 2015-12-08 VITALS — BP 130/85 | HR 84 | Temp 97.7°F | Resp 12 | Wt 198.6 lb

## 2015-12-08 DIAGNOSIS — M542 Cervicalgia: Secondary | ICD-10-CM | POA: Diagnosis not present

## 2015-12-08 DIAGNOSIS — R51 Headache: Secondary | ICD-10-CM | POA: Diagnosis not present

## 2015-12-08 DIAGNOSIS — R519 Headache, unspecified: Secondary | ICD-10-CM

## 2015-12-08 MED ORDER — CYCLOBENZAPRINE HCL 10 MG PO TABS: 10.0000 mg | ORAL_TABLET | Freq: Three times a day (TID) | ORAL | Status: DC | PRN

## 2015-12-08 NOTE — Progress Notes (Signed)
Subjective:    Patient ID: Samantha Delacruz, female    DOB: 22-Jul-1966, 50 y.o.   MRN: [id number]   Samantha Delacruz is a 50 y.o.female here today c/o a month of headache.  She has hx of migraines but does not feel like this is a typical migraine. She states that this pain is new and has had it for a couple months. Initially it was right fronto-temporal and now it is on right parietal  and occipital.  She is concerned about something in her brain causing pain, "tumor", headache is constant, present since she gets up, sometimes it gets better. Pain is not as bad as her migraines, getting worse, dull pain , 2/10, radiated to right occipital area and neck. She also states that she has hx of anemia and has been eating ice. No associated fatigue, dyspnea,chest pain, or abnormal wt loss.    Lab Results  Component Value Date   WBC 7.8 12/24/2014   HGB 13.1 12/24/2014   HCT 38.4 12/24/2014   MCV 85.2 12/24/2014   PLT 309.0 12/24/2014     Headache  This is a chronic problem. The current episode started more than 1 month ago. The problem occurs daily. The problem has been gradually worsening. The pain is located in the right unilateral and occipital region. Associated symptoms include muscle aches, neck pain, phonophobia and photophobia. Pertinent negatives include no abdominal pain, coughing, dizziness, ear pain, eye pain, fever, insomnia, loss of balance, nausea, numbness, rhinorrhea, seizures, sinus pressure, sore throat, swollen glands, tingling, visual change, vomiting or weakness. The symptoms are aggravated by bright light. She has tried NSAIDs for the symptoms. The treatment provided mild relief. Her past medical history is significant for migraine headaches. There is no history of recent head traumas.   She was taking Advil a few times per day until 3-4 days ago. Started with nausea, no vomiting. No visual aura (which she has sometimes with her typical migraine). No focal  weakness, unusual stress, or hx of recent trauma.  No Hx of insect bite. She mentions that she had bedbugs in her bed a year ago and wonders if this is related.  She has Hx of allergic rhinitis but has not noted nasal congestion, rhinorrhea, or other respiratory symptom. States that in 09/2015 a chiropractor was in her office and massage area, headache improved for a few hours but came back.    Review of Systems  Constitutional: Negative for fever, activity change, appetite change, fatigue and unexpected weight change.  HENT: Negative for congestion, dental problem, ear pain, facial swelling, mouth sores, rhinorrhea, sinus pressure, sore throat and trouble swallowing.   Eyes: Positive for photophobia. Negative for pain and visual disturbance.  Respiratory: Negative for apnea, cough and shortness of breath.   Cardiovascular: Positive for palpitations (chronic). Negative for chest pain and leg swelling.  Gastrointestinal: Negative for nausea, vomiting and abdominal pain.  Musculoskeletal: Positive for neck pain. Negative for myalgias and gait problem.  Skin: Negative for color change and rash.  Neurological: Positive for headaches. Negative for dizziness, tingling, tremors, seizures, syncope, facial asymmetry, speech difficulty, weakness, numbness and loss of balance.  Psychiatric/Behavioral: Negative for confusion and sleep disturbance. The patient is not nervous/anxious and does not have insomnia.      Current Outpatient Prescriptions on File Prior to Visit  Medication Sig Dispense Refill  . albuterol (PROVENTIL HFA;VENTOLIN HFA) 108 (90 BASE) MCG/ACT inhaler Inhale 2 puffs into the lungs every 4 (four) hours as needed  for wheezing or shortness of breath. 1 Inhaler 2  . Fluticasone-Salmeterol (ADVAIR DISKUS) 250-50 MCG/DOSE AEPB Inhale 1 puff into the lungs 2 (two) times daily. 60 each 11  . ibuprofen (ADVIL,MOTRIN) 200 MG tablet Take 600 mg by mouth every 6 (six) hours as needed for  moderate pain.    Marland Kitchen loratadine (CLARITIN) 10 MG tablet Take 10 mg by mouth daily.      . Multiple Vitamin (MULTIVITAMIN) tablet Take 1 tablet by mouth daily.     No current facility-administered medications on file prior to visit.     Past Medical History  Diagnosis Date  . Adult ADHD   . Rosacea   . Vertigo   . Hemorrhoids, internal, with bleeding and mucous discharge - Grade 1 02/05/2014    Social History   Social History  . Marital Status: Married    Spouse Name: N/A  . Number of Children: 1  . Years of Education: N/A   Occupational History  .  [employer] History Main Topics  . Smoking status: Never Smoker   . Smokeless tobacco: Never Used  . Alcohol Use: 0.0 oz/week    0 Standard drinks or equivalent per week     Comment: occ  . Drug Use: No  . Sexual Activity: Not Currently     Comment: 1st intercourse 50 yo-Fewer than 5 partners   Other Topics Concern  . 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      Objective:   Physical Exam  Constitutional: She is oriented to person, place, and time. She appears well-developed. She does not appear ill. No distress.  HENT:  Head: Atraumatic.  Mouth/Throat: Oropharynx is clear and moist and mucous membranes are normal.  Eyes: Conjunctivae and EOM are normal. Pupils are equal, round, and reactive to light.  Fundoscopic exam:      The right eye shows no hemorrhage and no papilledema.       The left eye shows no hemorrhage and no papilledema.  Neck: Normal range of motion. Muscular tenderness (right paraspinal muscles. ROM elicits pain) present. No erythema and normal range of motion present.  Cardiovascular: Normal rate and  regular rhythm.   No murmur heard. Pulmonary/Chest: Effort normal and breath sounds normal.  Abdominal: Soft. She exhibits no mass. There is no tenderness.  Musculoskeletal: She exhibits no edema.       Cervical back: She exhibits tenderness and spasm.  Right > left trapezium spasm, right with mild tenderness.  Lymphadenopathy:    She has no cervical adenopathy.  Neurological: She is alert and oriented to person, place, and time. She has normal strength. She is not disoriented. No cranial nerve deficit or sensory deficit. Coordination and gait normal.  Reflex Scores:      Bicep reflexes are 2+ on the right side and 2+ on the left side.      Patellar reflexes are 2+ on the right side and 2+ on the left side. Psychiatric: Her speech is normal. Her mood appears anxious.  Well groomed, good eye contact.  Assessment & Plan:    Samantha Delacruz was seen today for headache.  Diagnoses and all orders for this visit:  Headache, unspecified headache type It does not seem to be a migraine, I think it is tension like headache. Because she is concerned and reporting this as a new pattern headache and getting worse, brain MRI ordered. She has Hx of arrhythmia, so Amitriptyline was not recommended today but needs to be considered if headache persists and negative MRI. Last cbc was WNL, 12/2014, cbc + BMP done today. Clearly instructed about warning signs. F/U in 4-6 weeks with PCP.  -     CBC with Differential -     BASIC METABOLIC PANEL WITH GFR -     MR Brain W Wo Contrast; Future  Cervicalgia Could also contribute to headache. Some side effects of Flexeril discussed. She could take it a bedtime. Chiropractor evaluation and treatment may help as well as PT. Local heat. F/U in 4-6 weeks.  -     cyclobenzaprine (FLEXERIL) 10 MG tablet; Take 1 tablet (10 mg total) by mouth 3 (three) times daily as needed for muscle spasms.     -Patient advised to return or notify a doctor immediately  if symptoms worsen or persist or new concerns arise.  Rashon Rezek G. [place], MD  [facility]. [place] office.

## 2015-12-08 NOTE — Patient Instructions (Addendum)
Pre visit review using our clinic review tool, if applicable. No additional management support is needed unless otherwise documented below in the visit note.  A few things to remember from today's visit:   1. Headache, unspecified headache type   2. Cervicalgia   Chiropractor evaluation recommended. If focal weakness, confusion, or numbness go to ER.    If you sign-up for My chart, you can communicate easier with [place] in case you have any question or concern.

## 2015-12-09 ENCOUNTER — Encounter: Payer: Self-pay | Admitting: Family Medicine

## 2015-12-09 LAB — CBC WITH DIFFERENTIAL/PLATELET
BASOS ABS: 0 10*3/uL (ref 0.0–0.1)
Basophils Relative: 0.4 % (ref 0.0–3.0)
EOS PCT: 1.9 % (ref 0.0–5.0)
Eosinophils Absolute: 0.2 10*3/uL (ref 0.0–0.7)
HCT: 37.1 % (ref 36.0–46.0)
HEMOGLOBIN: 12.6 g/dL (ref 12.0–15.0)
LYMPHS PCT: 28.7 % (ref 12.0–46.0)
Lymphs Abs: 3 10*3/uL (ref 0.7–4.0)
MCHC: 33.8 g/dL (ref 30.0–36.0)
MCV: 82.2 fl (ref 78.0–100.0)
Monocytes Absolute: 0.7 10*3/uL (ref 0.1–1.0)
Monocytes Relative: 6.7 % (ref 3.0–12.0)
Neutro Abs: 6.4 10*3/uL (ref 1.4–7.7)
Neutrophils Relative %: 62.3 % (ref 43.0–77.0)
Platelets: 347 10*3/uL (ref 150.0–400.0)
RBC: 4.52 Mil/uL (ref 3.87–5.11)
RDW: 14.9 % (ref 11.5–15.5)
WBC: 10.4 10*3/uL (ref 4.0–10.5)

## 2015-12-09 LAB — BASIC METABOLIC PANEL WITH GFR
BUN: 13 mg/dL (ref 7–25)
CO2: 25 mmol/L (ref 20–31)
CREATININE: 0.64 mg/dL (ref 0.50–1.05)
Calcium: 8.7 mg/dL (ref 8.6–10.4)
Chloride: 104 mmol/L (ref 98–110)
GFR, Est African American: >89 mL/min (ref >=60)
GFR, Est Non African American: >89 mL/min (ref >=60)
Glucose, Bld: 81 mg/dL (ref 65–99)
Potassium: 3.6 mmol/L (ref 3.5–5.3)
Sodium: 139 mmol/L (ref 135–146)

## 2015-12-29 ENCOUNTER — Telehealth: Payer: Self-pay | Admitting: *Deleted

## 2015-12-29 DIAGNOSIS — N921 Excessive and frequent menstruation with irregular cycle: Secondary | ICD-10-CM

## 2015-12-29 NOTE — Telephone Encounter (Signed)
Pt called to schedule [facility] which was mention to schedule in 2016 per note on 06/02/15. I checked with Dr.Fontaine (which is back up MD)  via staff message and per TF "If the issue is heavy periods as when she saw Izora Gala in October and she recommended the sonohysterogram she has not done until now then sonohysterogram okay. I'll examine her at that point. "  Order will placed, front desk will contact pt to schedule.

## 2016-01-19 ENCOUNTER — Other Ambulatory Visit: Payer: Self-pay | Admitting: Gynecology

## 2016-01-19 DIAGNOSIS — N939 Abnormal uterine and vaginal bleeding, unspecified: Secondary | ICD-10-CM

## 2016-02-18 ENCOUNTER — Telehealth: Payer: Self-pay | Admitting: Gynecology

## 2016-02-18 NOTE — Telephone Encounter (Signed)
02/18/16-I LM VM for pt that her [facility] will cover the sonohysterogram and bx if needed with a $45 copay. Per MJ@BC -[id number]

## 2016-02-22 ENCOUNTER — Telehealth: Payer: Self-pay

## 2016-02-22 ENCOUNTER — Other Ambulatory Visit: Payer: Self-pay | Admitting: Gynecology

## 2016-02-22 ENCOUNTER — Ambulatory Visit (INDEPENDENT_AMBULATORY_CARE_PROVIDER_SITE_OTHER): Payer: BC Managed Care – PPO

## 2016-02-22 ENCOUNTER — Ambulatory Visit (INDEPENDENT_AMBULATORY_CARE_PROVIDER_SITE_OTHER): Payer: BC Managed Care – PPO | Admitting: Gynecology

## 2016-02-22 ENCOUNTER — Encounter: Payer: Self-pay | Admitting: Gynecology

## 2016-02-22 VITALS — BP 124/78

## 2016-02-22 DIAGNOSIS — N939 Abnormal uterine and vaginal bleeding, unspecified: Secondary | ICD-10-CM

## 2016-02-22 DIAGNOSIS — D251 Intramural leiomyoma of uterus: Secondary | ICD-10-CM | POA: Diagnosis not present

## 2016-02-22 DIAGNOSIS — N839 Noninflammatory disorder of ovary, fallopian tube and broad ligament, unspecified: Secondary | ICD-10-CM

## 2016-02-22 DIAGNOSIS — B372 Candidiasis of skin and nail: Secondary | ICD-10-CM | POA: Diagnosis not present

## 2016-02-22 DIAGNOSIS — N838 Other noninflammatory disorders of ovary, fallopian tube and broad ligament: Secondary | ICD-10-CM

## 2016-02-22 DIAGNOSIS — N921 Excessive and frequent menstruation with irregular cycle: Secondary | ICD-10-CM | POA: Diagnosis not present

## 2016-02-22 DIAGNOSIS — N926 Irregular menstruation, unspecified: Secondary | ICD-10-CM

## 2016-02-22 MED ORDER — NYSTATIN-TRIAMCINOLONE 100000-0.1 UNIT/GM-% EX OINT: 1.0000 "application " | TOPICAL_OINTMENT | Freq: Two times a day (BID) | CUTANEOUS | Status: DC

## 2016-02-22 NOTE — Telephone Encounter (Signed)
I called patient and left message for her to call me about scheduling.

## 2016-02-22 NOTE — Patient Instructions (Signed)
Applied the prescribed cream to the irritated skin area twice daily as needed.  Office will call you with the biopsy results and blood test results.  Office will call you to schedule the D&C.

## 2016-02-22 NOTE — Progress Notes (Addendum)
    Samantha Delacruz Jul 07, 1966 [id number]        50 y.o.  G1P1 presents for sonohysterogram. History of heavy menses over the past year. Her menses seem to be getting heavier. She then has skipped several months up until her last LMP 01/28/2016. At that point she bled heavily for a week. Was having some hot flashes and sweats but these have resolved. Also complaining of a skin rash in the fold of her anterior abdominal wall.  Past medical history,surgical history, problem list, medications, allergies, family history and social history were all reviewed and documented in the EPIC chart.  Directed ROS with pertinent positives and negatives documented in the history of present illness/assessment and plan.  Exam: [place] assistant Filed Vitals:   02/22/16 1258  BP: 124/78   General appearance:  Normal Abdomen soft nontender without masses guarding rebound. 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 Pelvic external BUS vagina normal.  Cervix normal. Uterus anteverted normal size midline mobile nontender. Adnexa without masses or tenderness.  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 Endometrial echo 4.0 mm. Right ovary normal. Left ovary difficult to visualize. Some low-level calcifications but no definitive mass. Doppler flow to the area shows flow but no definitive mass. Cul-de-sac negative.  Sonohysterogram performed, sterile technique, single-tooth tenaculum anterior lip stabilization for catheter introduction, good distention with 8 x 4 x 14 mm defect consistent with endometrial polyp. Endometrial sample taken. Patient tolerated well.  Assessment/Plan:  50 y.o. G1P1 with heavy menses now somewhat irregular. Check baseline FSH. Ultrasound shows endometrial polyp. Left ovary visualized although difficult due to bowel activity. No gross lesions noted. Recommended hysteroscopy D&C with resection of the endometrial polyp.  I reviewed the  proposed surgery with the patient to include the expected intraoperative and postoperative courses as well as the recovery period. The use of the hysteroscope, resectoscope and the D&C portion were all discussed. The risks of surgery to include infection, prolonged antibiotics, hemorrhage necessitating transfusion and the risks of transfusion, including transfusion reaction, hepatitis, HIV, mad cow disease and other unknown entities were all discussed understood and accepted. The risk of damage to internal organs during the procedure, either immediately recognized or delay recognized, including vagina, cervix, uterus, possible perforation causing damage to bowel, bladder, ureters, vessels and nerves necessitating major exploratory reparative surgery and future reparative surgeries including bladder repair, ureteral damage repair, bowel resection, ostomy formation was also discussed understood and accepted. The potential for distended media absorption leading to metabolic complications such as fluid overload, coma and seizures was also discussed understood and accepted. She will go ahead and schedule at her convenience. She'll follow up for the endometrial biopsy results and her [facility] results.  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 We'll treat with Mycolog equivalent twice daily until gone. Did recommend using prophylactically once / twice weekly to prevent recurrence during the summer months.  Follow up if persists, worsens or recur.  Anastasio Auerbach MD, 1:14 PM 02/22/2016

## 2016-02-23 ENCOUNTER — Encounter: Payer: Self-pay | Admitting: Gynecology

## 2016-02-23 LAB — FOLLICLE STIMULATING HORMONE: FSH: 41.2 m[IU]/mL

## 2016-02-24 ENCOUNTER — Telehealth: Payer: Self-pay

## 2016-02-24 MED ORDER — MISOPROSTOL 200 MCG PO TABS: ORAL_TABLET | ORAL | Status: DC

## 2016-02-24 NOTE — Telephone Encounter (Signed)
Patient called back and we discussed her ins benefits for surgery and he estimated surgery pre payment due. She requested to wait until Sept for surgery for financial reasons.  She would like to schedule for 04/19/16.  I will hold time for her on that block morning and will call her closer to time and let her know time of surgery.  She scheduled pre op cons for 8/30 at 3:00pm . I instructed her regarding need for Cytotec 200 mcg vaginally hs before surgery and Rx was sent.

## 2016-03-22 ENCOUNTER — Encounter (HOSPITAL_COMMUNITY): Payer: Self-pay | Admitting: *Deleted

## 2016-03-22 ENCOUNTER — Telehealth: Payer: Self-pay

## 2016-03-22 NOTE — Telephone Encounter (Signed)
I called patient because when I talked to her back in July about her surgery being scheduled for 04/19/16 time was to be determined. Now I have scheduled her for 7:30am and wanted her to know. I reminded her about her pre op consult date. I reminded her regarding need for Cytotec tab vaginally night before and she has already picked that up from pharmacy as I sent if for her back in July.  Patient was advised that she could possible feel some cramping or have some spotting night before surgery with Cytotec and that is not a problem it just means medication is working.

## 2016-03-24 ENCOUNTER — Encounter: Payer: Self-pay | Admitting: Gynecology

## 2016-04-06 NOTE — Patient Instructions (Signed)
Your procedure is scheduled on:  Tuesday, Sept. 5, 2017  Enter through the [employer] [facility] at:  6:00 AM  Pick up the phone at the desk and dial [phone].  Call this number if you have problems the morning of surgery: [phone].  Remember: Do NOT eat food or drink after:  Midnight Monday, Sept. 4, 2017  Take these medicines the morning of surgery with a SIP OF WATER:  None  Continue normal routine for asthma inhaler  Bring Asthma inhaler day of surgery  Do NOT wear jewelry (body piercing), metal hair clips/bobby pins, make-up, or nail polish. Do NOT wear lotions, powders, or perfumes.  You may wear deodorant. Do NOT shave for 48 hours prior to surgery. Do NOT bring valuables to the hospital. Contacts, dentures, or bridgework may not be worn into surgery.  Have a responsible adult drive you home and stay with you for 24 hours after your procedure

## 2016-04-07 ENCOUNTER — Encounter (HOSPITAL_COMMUNITY)
Admission: RE | Admit: 2016-04-07 | Discharge: 2016-04-07 | Disposition: A | Discharge status: Home / Self Care | Payer: BC Managed Care – PPO | Source: Ambulatory Visit | Attending: Gynecology | Admitting: Gynecology

## 2016-04-07 ENCOUNTER — Encounter (HOSPITAL_COMMUNITY): Payer: Self-pay

## 2016-04-07 DIAGNOSIS — Z01812 Encounter for preprocedural laboratory examination: Secondary | ICD-10-CM | POA: Diagnosis present

## 2016-04-07 DIAGNOSIS — N920 Excessive and frequent menstruation with regular cycle: Secondary | ICD-10-CM | POA: Diagnosis not present

## 2016-04-07 HISTORY — DX: Headache: R51

## 2016-04-07 HISTORY — DX: Palpitations: R00.2

## 2016-04-07 HISTORY — DX: Restless legs syndrome: G25.81

## 2016-04-07 HISTORY — DX: Pneumonia, unspecified organism: J18.9

## 2016-04-07 HISTORY — DX: Headache, unspecified: R51.9

## 2016-04-07 HISTORY — DX: Anemia, unspecified: D64.9

## 2016-04-07 HISTORY — DX: Unspecified asthma, uncomplicated: J45.909

## 2016-04-07 LAB — COMPREHENSIVE METABOLIC PANEL
ALBUMIN: 4 g/dL (ref 3.5–5.0)
ALT: 14 U/L (ref 14–54)
ANION GAP: 6 (ref 5–15)
AST: 20 U/L (ref 15–41)
Alkaline Phosphatase: 62 U/L (ref 38–126)
BUN: 11 mg/dL (ref 6–20)
CHLORIDE: 105 mmol/L (ref 101–111)
CO2: 24 mmol/L (ref 22–32)
Calcium: 8.7 mg/dL — ABNORMAL LOW (ref 8.9–10.3)
Creatinine, Ser: 0.66 mg/dL (ref 0.44–1.00)
GFR calc Af Amer: >60 mL/min (ref >60)
Glucose, Bld: 94 mg/dL (ref 65–99)
POTASSIUM: 3.9 mmol/L (ref 3.5–5.1)
Sodium: 135 mmol/L (ref 135–145)
TOTAL PROTEIN: 7.4 g/dL (ref 6.5–8.1)
Total Bilirubin: 0.6 mg/dL (ref 0.3–1.2)

## 2016-04-07 LAB — CBC
HEMATOCRIT: 38.4 % (ref 36.0–46.0)
HEMOGLOBIN: 13.4 g/dL (ref 12.0–15.0)
MCH: 28 pg (ref 26.0–34.0)
MCHC: 34.9 g/dL (ref 30.0–36.0)
MCV: 80.2 fL (ref 78.0–100.0)
Platelets: 353 10*3/uL (ref 150–400)
RBC: 4.79 MIL/uL (ref 3.87–5.11)
RDW: 14 % (ref 11.5–15.5)
WBC: 8.8 10*3/uL (ref 4.0–10.5)

## 2016-04-13 ENCOUNTER — Ambulatory Visit (INDEPENDENT_AMBULATORY_CARE_PROVIDER_SITE_OTHER): Payer: BC Managed Care – PPO | Admitting: Gynecology

## 2016-04-13 VITALS — BP 118/76

## 2016-04-13 DIAGNOSIS — N920 Excessive and frequent menstruation with regular cycle: Secondary | ICD-10-CM | POA: Diagnosis not present

## 2016-04-13 DIAGNOSIS — D251 Intramural leiomyoma of uterus: Secondary | ICD-10-CM

## 2016-04-13 DIAGNOSIS — N840 Polyp of corpus uteri: Secondary | ICD-10-CM | POA: Diagnosis not present

## 2016-04-13 NOTE — H&P (Signed)
  Samantha Delacruz [birth year] [id number]   History and Physical  Chief complaint: Menorrhagia, endometrial polyp  History of present illness: 50 y.o. G1P1 with worsening menorrhagia over the past year.  Had recent [facility] of 41. Sonohysterogram showed uterus generous in size with small intramural myoma 15 mm. Endometrial echo 4 mm. Intracavitary defect 8 x 4 x 14 mm noted consistent with endometrial polyp. Endometrial sample showed weakly proliferative endometrium. Patient is admitted for hysteroscopy D&C with resection of the endometrial polyp.   Past medical history,surgical history, medications, allergies, family history and social history were all reviewed and documented in the EPIC chart.  ROS:  Was performed and pertinent positives and negatives are included in the history of present illness.  Exam:  [name] assistant Vitals:   04/13/16 1504  BP: 118/76   General: well developed, well nourished female, no acute distress HEENT: normal  Lungs: clear to auscultation without wheezing, rales or rhonchi  Cardiac: regular rate without rubs, murmurs or gallops  Abdomen: soft, nontender without masses, guarding, rebound, organomegaly  Pelvic: external bus vagina: normal with light menses flow  Cervix: grossly normal  Uterus: normal size, midline and mobile, nontender  Adnexa: without masses or tenderness     Assessment/Plan:  50 y.o. G1P1 with worsening menorrhagia and sonohysterogram consistent with endometrial polyp. Benign endometrial biopsy. Patient for hysteroscopy D&C.  I reviewed the proposed surgery with the patient to include the expected intraoperative and postoperative courses as well as the recovery period. The use of the hysteroscope, resectoscope and the D&C portion were all discussed. The risks of surgery to include infection, prolonged antibiotics, hemorrhage necessitating transfusion and the risks of transfusion, including transfusion reaction, hepatitis, HIV, mad cow  disease and other unknown entities were all discussed understood and accepted. The risk of damage to internal organs during the procedure, either immediately recognized or delay recognized, including vagina, cervix, uterus, possible perforation causing damage to bowel, bladder, ureters, vessels and nerves necessitating major exploratory reparative surgery and future reparative surgeries including bladder repair, ureteral damage repair, bowel resection, ostomy formation was also discussed understood and accepted. The potential for distended media absorption leading to metabolic complications such as fluid overload, coma and seizures was also discussed understood and accepted. She understands there are no guarantees that this will relieve her menorrhagia and her periods may continue heavy or get worse. The patient's questions were answered to her satisfaction and she is ready to proceed with surgery.      Anastasio Auerbach MD, 3:28 PM 04/13/2016

## 2016-04-13 NOTE — Progress Notes (Signed)
Samantha Delacruz 1966/03/14 [id number]   Preoperative consult  Chief complaint: Menorrhagia, endometrial polyp  History of present illness: 50 y.o. G1P1 with worsening menorrhagia over the past year.  Had recent [facility] of 41. Sonohysterogram showed uterus generous in size with small intramural myoma 15 mm. Endometrial echo 4 mm. Intracavitary defect 8 x 4 x 14 mm noted consistent with endometrial polyp. Endometrial sample showed weakly proliferative endometrium. Patient is admitted for hysteroscopy D&C with resection of the endometrial polyp.   Past medical history,surgical history, medications, allergies, family history and social history were all reviewed and documented in the EPIC chart.  ROS:  Was performed and pertinent positives and negatives are included in the history of present illness.  Exam:  [name] assistant Vitals:   04/13/16 1504  BP: 118/76   General: well developed, well nourished female, no acute distress HEENT: normal  Lungs: clear to auscultation without wheezing, rales or rhonchi  Cardiac: regular rate without rubs, murmurs or gallops  Abdomen: soft, nontender without masses, guarding, rebound, organomegaly  Pelvic: external bus vagina: normal with light menses flow  Cervix: grossly normal  Uterus: normal size, midline and mobile, nontender  Adnexa: without masses or tenderness     Assessment/Plan:  50 y.o. G1P1 with worsening menorrhagia and sonohysterogram consistent with endometrial polyp. Benign endometrial biopsy. Patient for hysteroscopy D&C.  I reviewed the proposed surgery with the patient to include the expected intraoperative and postoperative courses as well as the recovery period. The use of the hysteroscope, resectoscope and the D&C portion were all discussed. The risks of surgery to include infection, prolonged antibiotics, hemorrhage necessitating transfusion and the risks of transfusion, including transfusion reaction, hepatitis, HIV, mad cow  disease and other unknown entities were all discussed understood and accepted. The risk of damage to internal organs during the procedure, either immediately recognized or delay recognized, including vagina, cervix, uterus, possible perforation causing damage to bowel, bladder, ureters, vessels and nerves necessitating major exploratory reparative surgery and future reparative surgeries including bladder repair, ureteral damage repair, bowel resection, ostomy formation was also discussed understood and accepted. The potential for distended media absorption leading to metabolic complications such as fluid overload, coma and seizures was also discussed understood and accepted. She understands there are no guarantees that this will relieve her menorrhagia and her periods may continue heavy or get worse. The patient's questions were answered to her satisfaction and she is ready to proceed with surgery.    Anastasio Auerbach MD, 3:21 PM 04/13/2016

## 2016-04-13 NOTE — Patient Instructions (Signed)
Followup for surgery as scheduled. 

## 2016-04-18 NOTE — Anesthesia Preprocedure Evaluation (Addendum)
Anesthesia Evaluation  Patient identified by MRN, date of birth, ID band Patient awake    Reviewed: Allergy & Precautions, NPO status , Patient's Chart, lab work & pertinent test results  History of Anesthesia Complications Negative for: history of anesthetic complications  Airway Mallampati: II  TM Distance: >3 FB Neck ROM: Full    Dental  (+) Teeth Intact, Dental Advisory Given   Pulmonary neg shortness of breath, asthma (last used inhaler 6 months ago) , neg sleep apnea, neg COPD, neg recent URI,    Pulmonary exam normal breath sounds clear to auscultation       Cardiovascular (-) hypertension(-) angina(-) Past MI, (-) Cardiac Stents, (-) CABG and (-) Orthopnea + dysrhythmias (palpitations, incomplete RBBB)  Rhythm:Regular Rate:Normal     Neuro/Psych  Headaches, neg Seizures PSYCHIATRIC DISORDERS (ADHD) BPPV, RLS    GI/Hepatic negative GI ROS, Neg liver ROS, neg GERD  ,  Endo/Other  negative endocrine ROS  Renal/GU negative Renal ROS     Musculoskeletal   Abdominal (+) + obese,   Peds  Hematology  (+) Blood dyscrasia, anemia ,   Anesthesia Other Findings rosacea  Reproductive/Obstetrics                            Anesthesia Physical Anesthesia Plan  ASA: II  Anesthesia Plan: General   Post-op Pain Management:    Induction: Intravenous  Airway Management Planned: LMA  Additional Equipment:   Intra-op Plan:   Post-operative Plan: Extubation in OR  Informed Consent: I have reviewed the patients History and Physical, chart, labs and discussed the procedure including the risks, benefits and alternatives for the proposed anesthesia with the patient or authorized representative who has indicated his/her understanding and acceptance.   Dental advisory given  Plan Discussed with:   Anesthesia Plan Comments: (Risks of general anesthesia discussed including, but not limited to, sore  throat, hoarse voice, chipped/damaged teeth, injury to vocal cords, nausea and vomiting, allergic reactions, lung infection, heart attack, stroke, and death. All questions answered. )       Anesthesia Quick Evaluation

## 2016-04-19 ENCOUNTER — Ambulatory Visit (HOSPITAL_COMMUNITY): Payer: BC Managed Care – PPO | Admitting: Anesthesiology

## 2016-04-19 ENCOUNTER — Encounter (HOSPITAL_COMMUNITY): Payer: Self-pay

## 2016-04-19 ENCOUNTER — Encounter (HOSPITAL_COMMUNITY)
Admission: AD | Disposition: A | Discharge status: Home / Self Care | Payer: Self-pay | Source: Ambulatory Visit | Attending: Gynecology

## 2016-04-19 ENCOUNTER — Ambulatory Visit (HOSPITAL_COMMUNITY)
Admission: AD | Admit: 2016-04-19 | Discharge: 2016-04-19 | Disposition: A | Discharge status: Home / Self Care | Payer: BC Managed Care – PPO | Source: Ambulatory Visit | Attending: Gynecology | Admitting: Gynecology

## 2016-04-19 DIAGNOSIS — N840 Polyp of corpus uteri: Secondary | ICD-10-CM | POA: Insufficient documentation

## 2016-04-19 DIAGNOSIS — N920 Excessive and frequent menstruation with regular cycle: Secondary | ICD-10-CM | POA: Diagnosis not present

## 2016-04-19 HISTORY — PX: DILATATION & CURETTAGE/HYSTEROSCOPY WITH MYOSURE: SHX6511

## 2016-04-19 LAB — PREGNANCY, URINE: PREG TEST UR: NEGATIVE

## 2016-04-19 SURGERY — DILATATION & CURETTAGE/HYSTEROSCOPY WITH MYOSURE
Anesthesia: General | Site: Vagina

## 2016-04-19 MED ORDER — MIDAZOLAM HCL 2 MG/2ML IJ SOLN
INTRAMUSCULAR | Status: AC
  Filled 2016-04-19: qty 2

## 2016-04-19 MED ORDER — LIDOCAINE HCL (CARDIAC) 20 MG/ML IV SOLN
INTRAVENOUS | Status: AC
Start: 2016-04-19 — End: 2016-04-19
  Filled 2016-04-19: qty 5

## 2016-04-19 MED ORDER — DEXAMETHASONE SODIUM PHOSPHATE 10 MG/ML IJ SOLN
INTRAMUSCULAR | Status: AC
  Filled 2016-04-19: qty 1

## 2016-04-19 MED ORDER — FENTANYL CITRATE (PF) 100 MCG/2ML IJ SOLN: 25.0000 ug | INTRAMUSCULAR | Status: DC | PRN

## 2016-04-19 MED ORDER — LACTATED RINGERS IV SOLN
INTRAVENOUS | Status: DC
  Administered 2016-04-19: 125 mL/h via INTRAVENOUS

## 2016-04-19 MED ORDER — PROMETHAZINE HCL 25 MG/ML IJ SOLN: 6.2500 mg | INTRAMUSCULAR | Status: DC | PRN

## 2016-04-19 MED ORDER — FENTANYL CITRATE (PF) 100 MCG/2ML IJ SOLN
INTRAMUSCULAR | Status: AC
  Filled 2016-04-19: qty 2

## 2016-04-19 MED ORDER — LIDOCAINE HCL 1 % IJ SOLN
INTRAMUSCULAR | Status: AC
  Filled 2016-04-19: qty 20

## 2016-04-19 MED ORDER — ONDANSETRON HCL 4 MG/2ML IJ SOLN
INTRAMUSCULAR | Status: AC
  Filled 2016-04-19: qty 2

## 2016-04-19 MED ORDER — PROPOFOL 10 MG/ML IV BOLUS
INTRAVENOUS | Status: AC
  Filled 2016-04-19: qty 20

## 2016-04-19 MED ORDER — SCOPOLAMINE 1 MG/3DAYS TD PT72
MEDICATED_PATCH | TRANSDERMAL | Status: AC
  Filled 2016-04-19: qty 1

## 2016-04-19 MED ORDER — ONDANSETRON HCL 4 MG/2ML IJ SOLN
INTRAMUSCULAR | Status: DC | PRN
  Administered 2016-04-19: 4 mg via INTRAVENOUS

## 2016-04-19 MED ORDER — KETOROLAC TROMETHAMINE 30 MG/ML IJ SOLN
INTRAMUSCULAR | Status: AC
  Filled 2016-04-19: qty 1

## 2016-04-19 MED ORDER — PROPOFOL 10 MG/ML IV BOLUS
INTRAVENOUS | Status: DC | PRN
  Administered 2016-04-19: 200 mg via INTRAVENOUS

## 2016-04-19 MED ORDER — SODIUM CHLORIDE 0.9 % IR SOLN
Status: DC | PRN
  Administered 2016-04-19: 3000 mL

## 2016-04-19 MED ORDER — FENTANYL CITRATE (PF) 100 MCG/2ML IJ SOLN
INTRAMUSCULAR | Status: DC | PRN
  Administered 2016-04-19 (×2): 50 ug via INTRAVENOUS

## 2016-04-19 MED ORDER — CEFAZOLIN SODIUM-DEXTROSE 2-4 GM/100ML-% IV SOLN
2.0000 g | INTRAVENOUS | Status: AC
  Administered 2016-04-19: 2 g via INTRAVENOUS

## 2016-04-19 MED ORDER — TRAMADOL HCL 50 MG PO TABS: 50.0000 mg | ORAL_TABLET | Freq: Four times a day (QID) | ORAL | 0 refills | Status: DC | PRN

## 2016-04-19 MED ORDER — LIDOCAINE HCL (CARDIAC) 20 MG/ML IV SOLN
INTRAVENOUS | Status: DC | PRN
  Administered 2016-04-19: 100 mg via INTRAVENOUS

## 2016-04-19 MED ORDER — LIDOCAINE HCL 1 % IJ SOLN
INTRAMUSCULAR | Status: DC | PRN
  Administered 2016-04-19: 10 mL

## 2016-04-19 MED ORDER — DEXAMETHASONE SODIUM PHOSPHATE 4 MG/ML IJ SOLN
INTRAMUSCULAR | Status: DC | PRN
  Administered 2016-04-19: 10 mg via INTRAVENOUS

## 2016-04-19 MED ORDER — SCOPOLAMINE 1 MG/3DAYS TD PT72
1.0000 | MEDICATED_PATCH | Freq: Once | TRANSDERMAL | Status: DC
  Administered 2016-04-19: 1.5 mg via TRANSDERMAL

## 2016-04-19 MED ORDER — MIDAZOLAM HCL 5 MG/5ML IJ SOLN
INTRAMUSCULAR | Status: DC | PRN
  Administered 2016-04-19: 2 mg via INTRAVENOUS

## 2016-04-19 MED ORDER — KETOROLAC TROMETHAMINE 30 MG/ML IJ SOLN
INTRAMUSCULAR | Status: DC | PRN
  Administered 2016-04-19: 30 mg via INTRAVENOUS

## 2016-04-19 SURGICAL SUPPLY — 17 items
CATH ROBINSON RED A/P 16FR (CATHETERS) IMPLANT
CLOTH BEACON ORANGE TIMEOUT ST (SAFETY) ×2 IMPLANT
CONTAINER PREFILL 10% NBF 60ML (FORM) ×4 IMPLANT
DEVICE MYOSURE LITE (MISCELLANEOUS) ×2 IMPLANT
DEVICE MYOSURE REACH (MISCELLANEOUS) IMPLANT
FILTER ARTHROSCOPY CONVERTOR (FILTER) ×2 IMPLANT
GLOVE BIO SURGEON STRL SZ7.5 (GLOVE) ×4 IMPLANT
GLOVE BIOGEL PI IND STRL 7.0 (GLOVE) ×1 IMPLANT
GLOVE BIOGEL PI INDICATOR 7.0 (GLOVE) ×1
GOWN STRL REUS W/TWL LRG LVL3 (GOWN DISPOSABLE) ×4 IMPLANT
PACK VAGINAL MINOR WOMEN LF (CUSTOM PROCEDURE TRAY) ×2 IMPLANT
PAD OB MATERNITY 4.3X12.25 (PERSONAL CARE ITEMS) ×2 IMPLANT
SEAL ROD LENS SCOPE MYOSURE (ABLATOR) ×2 IMPLANT
TOWEL OR 17X24 6PK STRL BLUE (TOWEL DISPOSABLE) ×4 IMPLANT
TUBING AQUILEX INFLOW (TUBING) ×2 IMPLANT
TUBING AQUILEX OUTFLOW (TUBING) ×2 IMPLANT
WATER STERILE IRR 1000ML POUR (IV SOLUTION) ×2 IMPLANT

## 2016-04-19 NOTE — H&P (Signed)
  The patient was examined.  I reviewed the proposed surgery and consent form with the patient.  The dictated history and physical is current and accurate and all questions were answered. The patient is ready to proceed with surgery and has a realistic understanding and expectation for the outcome.   Anastasio Auerbach MD, 6:56 AM 04/19/2016

## 2016-04-19 NOTE — Anesthesia Postprocedure Evaluation (Signed)
Anesthesia Post Note  Patient: Samantha Delacruz  Procedure(s) Performed: Procedure(s) (LRB): DILATATION & CURETTAGE/HYSTEROSCOPY WITH MYOSURE (N/A)  Patient location during evaluation: PACU Anesthesia Type: General Level of consciousness: awake and alert Pain management: pain level controlled Vital Signs Assessment: post-procedure vital signs reviewed and stable Respiratory status: spontaneous breathing, nonlabored ventilation and respiratory function stable Cardiovascular status: blood pressure returned to baseline and stable Postop Assessment: no signs of nausea or vomiting Anesthetic complications: no     Last Vitals:  Vitals:   04/19/16 0830 04/19/16 0845  BP: 118/80 (!) 126/94  Pulse: 65 64  Resp: 15 20  Temp:      Last Pain:  Vitals:   04/19/16 0845  TempSrc:   PainSc: 0-No pain   Pain Goal: Patients Stated Pain Goal: 3 (04/19/16 0647)               Nilda Simmer

## 2016-04-19 NOTE — Discharge Instructions (Signed)
°  Postoperative Instructions Hysteroscopy D & C ° °Dr. Fontaine and the nursing staff have discussed postoperative instructions with you.  If you have any questions please ask them before you leave the hospital, or call Dr Fontaine’s office at [phone].   ° °We would like to emphasize the following instructions: ° ° °? Call the office to make your follow-up appointment as recommended by Dr Fontaine (usually 1-2 weeks). ° °? You were given a prescription, or one was ordered for you at the pharmacy you designated.  Get that prescription filled and take the medication according to instructions. ° °? You may eat a regular diet, but slowly until you start having bowel movements. ° °? Drink plenty of water daily. ° °? Nothing in the vagina (intercourse, douching, objects of any kind) for two weeks.  When reinitiating intercourse, if it is uncomfortable, stop and make an appointment with Dr Fontaine to be evaluated. ° °? No driving for one to two days until the effects of anesthesia has worn off.  No traveling out of town for several days. ° °? You may shower, but no baths for one week.  Walking up and down stairs is ok.  No heavy lifting, prolonged standing, repeated bending or any “working out” until your post op check. ° °? Rest frequently, listen to your body and do not push yourself and overdo it. ° °? Call if: ° °o Your pain medication does not seem strong enough. °o Worsening pain or abdominal bloating °o Persistent nausea or vomiting °o Difficulty with urination or bowel movements. °o Temperature of 101 degrees or higher. °o Heavy vaginal bleeding.  If your period is due, you may use tampons. °o You have any questions or concerns ° ° ° °Post Anesthesia Home Care Instructions ° °Activity: °Get plenty of rest for the remainder of the day. A responsible adult should stay with you for 24 hours following the procedure.  °For the next 24 hours, DO NOT: °-Drive a car °-Operate machinery °-Drink alcoholic  beverages °-Take any medication unless instructed by your physician °-Make any legal decisions or sign important papers. ° °Meals: °Start with liquid foods such as gelatin or soup. Progress to regular foods as tolerated. Avoid greasy, spicy, heavy foods. If nausea and/or vomiting occur, drink only clear liquids until the nausea and/or vomiting subsides. Call your physician if vomiting continues. ° °Special Instructions/Symptoms: °Your throat may feel dry or sore from the anesthesia or the breathing tube placed in your throat during surgery. If this causes discomfort, gargle with warm salt water. The discomfort should disappear within 24 hours. ° °If you had a scopolamine patch placed behind your ear for the management of post- operative nausea and/or vomiting: ° °1. The medication in the patch is effective for 72 hours, after which it should be removed.  Wrap patch in a tissue and discard in the trash. Wash hands thoroughly with soap and water. °2. You may remove the patch earlier than 72 hours if you experience unpleasant side effects which may include dry mouth, dizziness or visual disturbances. °3. Avoid touching the patch. Wash your hands with soap and water after contact with the patch. °  ° °

## 2016-04-19 NOTE — Transfer of Care (Signed)
Immediate Anesthesia Transfer of Care Note  Patient: Wynn Banker Cantrelle  Procedure(s) Performed: Procedure(s) with comments: [place] (N/A) - request 7:30am start time   Requests one hour OR time.   Patient Location: PACU  Anesthesia Type:General  Level of Consciousness: sedated  Airway & Oxygen Therapy: Patient Spontanous Breathing and Patient connected to nasal cannula oxygen  Post-op Assessment: Report given to RN and Post -op Vital signs reviewed and stable  Post vital signs: Reviewed and stable  Last Vitals:  Vitals:   04/19/16 0647  BP: 132/87  Pulse: 72  Resp: 16  Temp: 36.4 C    Last Pain:  Vitals:   04/19/16 0647  TempSrc: Oral  PainSc: 0-No pain      Patients Stated Pain Goal: 3 ([id number])  Complications: No apparent anesthesia complications

## 2016-04-19 NOTE — Op Note (Signed)
LATERIA PLAUT 11/15/1965 [id number]   Post Operative Note   Date of surgery:  04/19/2016  Pre Op Dx:  Menorrhagia, endometrial polyp  Post Op Dx:  Menorrhagia, endometrial polyp  Procedure:  Hysteroscopy, D&C, Myosure resection of endometrial polyp  Surgeon:  Donalynn Furlong P  Anesthesia:  General  EBL:  Minimal  Distended media discrepancy:  90 cc saline  Complications:  None  Specimen:  #1 endometrial polyp fragments #2 endometrial curetting to pathology  Findings: EUA:  External BUS vagina normal. Cervix normal. Uterus anteverted normal size midline mobile. Adnexa without masses   Hysteroscopy:  Adequate noting fundus, anterior/posterior endometrial surfaces, right/left tubal ostia, lower uterine segment and endocervical canal all visualized. Fingerlike endometrial polyp upper right lateral endometrial surface resected in its entirety.  Procedure:  The patient was taken to the operating room, was placed in the low dorsal lithotomy position, underwent general anesthesia, received a perineal/vaginal preparation with Betadine solution per nursing personnel and the bladder was emptied with in and out Foley catheterization. The timeout was performed by the surgical team. An EUA was performed. The patient was draped in the usual fashion. The cervix was visualized with a speculum, anterior lip grasped with a single-tooth tenaculum and a paracervical block was placed using 10 cc's of 1% lidocaine. The cervix was gently dilated to admit the Myosure hysteroscope and hysteroscopy was performed with findings noted above. Using the Myosure Lite resectoscopic wand the polyp was resected in its entirety to the level the surrounding endometrium. A gentle sharp curettage was performed. Both specimens were sent separately to pathology.  Repeat hysteroscopy showed an empty cavity with good distention and no evidence of perforation. The instruments were removed and adequate hemostasis was visualized  at the tenaculum site and external cervical os. The patient was given intraoperative Toradol, was awakened without difficulty and was taken to the recovery room in good condition having tolerated the procedure well.    Anastasio Auerbach MD, 8:12 AM 04/19/2016

## 2016-04-19 NOTE — Anesthesia Procedure Notes (Signed)
Procedure Name: LMA Insertion Date/Time: 04/19/2016 7:31 AM Performed by: Riki Sheer Pre-anesthesia Checklist: Patient identified, Emergency Drugs available, Suction available, Patient being monitored and Timeout performed Patient Re-evaluated:Patient Re-evaluated prior to inductionOxygen Delivery Method: Circle system utilized Preoxygenation: Pre-oxygenation with 100% oxygen Intubation Type: IV induction Ventilation: Mask ventilation without difficulty LMA: LMA inserted LMA Size: 4.0 Number of attempts: 1 Placement Confirmation: positive ETCO2,  CO2 detector and breath sounds checked- equal and bilateral Tube secured with: Tape Dental Injury: Teeth and Oropharynx as per pre-operative assessment

## 2016-04-20 ENCOUNTER — Encounter (HOSPITAL_COMMUNITY): Payer: Self-pay | Admitting: Gynecology

## 2016-05-10 ENCOUNTER — Encounter: Payer: Self-pay | Admitting: Gynecology

## 2016-05-10 ENCOUNTER — Ambulatory Visit (INDEPENDENT_AMBULATORY_CARE_PROVIDER_SITE_OTHER): Payer: BC Managed Care – PPO | Admitting: Gynecology

## 2016-05-10 VITALS — BP 116/76

## 2016-05-10 DIAGNOSIS — Z9889 Other specified postprocedural states: Secondary | ICD-10-CM

## 2016-05-10 NOTE — Patient Instructions (Signed)
follow up when you are due for your annual exam.

## 2016-05-10 NOTE — Progress Notes (Signed)
    Samantha Delacruz May 08, 1966 [id number]        50 y.o.  G1P1 Presents for her postoperative visit status post hysteroscopic resection of endometrial polyps 04/19/2016. Has done well since with a light menses afterwards.  Past medical history,surgical history, problem list, medications, allergies, family history and social history were all reviewed and documented in the EPIC chart.  Directed ROS with pertinent positives and negatives documented in the history of present illness/assessment and plan.  Exam: Samantha Delacruz assistant Vitals:   05/10/16 1614  BP: 116/76   General appearance:  Normal abdomen soft nontender without masses guarding rebound Pelvic external BUS vagina normal. Cervix normal. Uterus normal size midline mobile nontender. Adnexa without masses or tenderness.  Assessment/Plan:  50 y.o. G1P1 with normal postoperative visit status post hysteroscopic resection of endometrial polyps. I reviewed benign pathology with her and pictures from the surgery. Patient will keep menstrual calendar and follow up if she has any significant abnormal bleeding. Otherwise she'll follow up when she is due for her annual exam.    Anastasio Auerbach MD, 4:40 PM 05/10/2016

## 2016-06-10 ENCOUNTER — Emergency Department (HOSPITAL_COMMUNITY)
Admission: EM | Admit: 2016-06-10 | Discharge: 2016-06-10 | Disposition: A | Discharge status: Home / Self Care | Payer: Worker's Compensation | Attending: Emergency Medicine | Admitting: Emergency Medicine

## 2016-06-10 ENCOUNTER — Emergency Department (HOSPITAL_COMMUNITY): Payer: Worker's Compensation

## 2016-06-10 ENCOUNTER — Encounter (HOSPITAL_COMMUNITY): Payer: Self-pay | Admitting: *Deleted

## 2016-06-10 DIAGNOSIS — S0990XA Unspecified injury of head, initial encounter: Secondary | ICD-10-CM | POA: Diagnosis present

## 2016-06-10 DIAGNOSIS — F909 Attention-deficit hyperactivity disorder, unspecified type: Secondary | ICD-10-CM | POA: Diagnosis not present

## 2016-06-10 DIAGNOSIS — J45909 Unspecified asthma, uncomplicated: Secondary | ICD-10-CM | POA: Insufficient documentation

## 2016-06-10 DIAGNOSIS — Y939 Activity, unspecified: Secondary | ICD-10-CM | POA: Diagnosis not present

## 2016-06-10 DIAGNOSIS — F0781 Postconcussional syndrome: Secondary | ICD-10-CM | POA: Diagnosis not present

## 2016-06-10 DIAGNOSIS — W228XXA Striking against or struck by other objects, initial encounter: Secondary | ICD-10-CM | POA: Diagnosis not present

## 2016-06-10 DIAGNOSIS — Z91040 Latex allergy status: Secondary | ICD-10-CM | POA: Diagnosis not present

## 2016-06-10 DIAGNOSIS — Y929 Unspecified place or not applicable: Secondary | ICD-10-CM | POA: Diagnosis not present

## 2016-06-10 DIAGNOSIS — Y999 Unspecified external cause status: Secondary | ICD-10-CM | POA: Diagnosis not present

## 2016-06-10 HISTORY — DX: Migraine, unspecified, not intractable, without status migrainosus: G43.909

## 2016-06-10 NOTE — ED Triage Notes (Signed)
Pt sent here by [facility] for difficulty thinking after hitting her head on concrete last Tuesday.  Pt states increasing headaches, states "my brain feels like air and then it feels heavy".  States difficulty figuring out [employer].

## 2016-06-10 NOTE — ED Provider Notes (Signed)
[facility] DEPT Provider Note   CSN: [id number] Arrival date & time: 06/10/16  [id number]     History   Chief Complaint Chief Complaint  Patient presents with  . Head Injury    HPI Samantha Delacruz is a 50 y.o. female.  [address] and hit her head ten days ago. sicne then has had intermittent headaches, emotional lability, odd out of body feelings and came here for evaluation. No other trauma. No fever, meningismus or other associated symptom.     Headache   This is a new problem. The current episode started more than 1 week ago. The problem occurs every few hours. The headache is associated with bright light. The pain is located in the frontal, bilateral, parietal, right unilateral, occipital, left unilateral and temporal region. The quality of the pain is described as sharp and dull. The pain is mild. The pain does not radiate. Pertinent negatives include no anorexia. She has tried nothing for the symptoms. The treatment provided no relief.    Past Medical History:  Diagnosis Date  . Adult ADHD   . Anemia    history of  . Asthma   . Headache    Migraines  . Heart palpitations   . Hemorrhoids, internal, with bleeding and mucous discharge - Grade 1 02/05/2014  . Migraines   . Pneumonia   . Restless leg   . Rosacea   . Vertigo     Patient Active Problem List   Diagnosis Date Noted  . Migraine without aura and without status migrainosus, not intractable 02/26/2015  . Ocular migraine 02/26/2015  . Asthma, chronic 12/29/2014  . Right hip pain 08/21/2014  . Superficial thrombophlebitis of lower extremity 08/04/2014  . Palpitations 08/04/2014  . Hemorrhoids, internal, with bleeding and mucous discharge - Grade 1 02/05/2014  . 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 Rectal bleeding 12/10/2013  . Allergic rhinitis 09/26/2013  . Benign paroxysmal positional vertigo 06/20/2013  . Seborrheic keratosis, inflamed 05/23/2013  . 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 Fibrocystic breast changes  10/02/2012  . Overweight 10/02/2012    Past Surgical History:  Procedure Laterality Date  . CESAREAN SECTION  08/15/92   son  . DILATATION & CURETTAGE/HYSTEROSCOPY WITH MYOSURE N/A 04/19/2016   Procedure: DILATATION & CURETTAGE/HYSTEROSCOPY WITH MYOSURE;  Surgeon: Anastasio Auerbach, MD;  Location: [facility];  Service: Gynecology;  Laterality: N/A;  request 7:30am start time   Requests one hour OR time.     OB History    Gravida Para Term Preterm AB Living   1 1       1    SAB TAB Ectopic Multiple Live Births                   Home Medications    Prior to Admission medications   Medication Sig Start Date End Date Taking? Authorizing Provider  albuterol (PROVENTIL HFA;VENTOLIN HFA) 108 (90 BASE) MCG/ACT inhaler Inhale 2 puffs into the lungs every 4 (four) hours as needed for wheezing or shortness of breath. 12/29/14  Yes Dorena Cookey, MD  Fluticasone-Salmeterol (ADVAIR DISKUS) 250-50 MCG/DOSE AEPB Inhale 1 puff into the lungs 2 (two) times daily. Patient taking differently: Inhale 1 puff into the lungs 2 (two) times daily as needed (asthma).  12/29/14  Yes Dorena Cookey, MD  ibuprofen (ADVIL,MOTRIN) 200 MG tablet Take 600 mg by mouth every 6 (six) hours as needed for moderate pain.   Yes Historical Provider, MD  loratadine (CLARITIN) 10 MG tablet Take 10 mg by mouth  daily.     Yes Historical Provider, MD  Multiple Vitamin (MULTIVITAMIN) tablet Take 1 tablet by mouth daily.   Yes Historical Provider, MD    Family History Family History  Problem Relation Age of Onset  . Depression Mother   . Diabetes Mother   . Emphysema Mother   . Stroke Mother   . Cancer Mother     uterine   . Heart disease Father   . Heart failure Father   . Thyroid disease Sister   . Thyroid disease Brother   . ADD / ADHD Son   . Stroke Maternal Grandmother   . Cancer Paternal Grandfather     esophgus   . Colon cancer Neg Hx     Social History Social History  Substance Use Topics  . Smoking status:  Never Smoker  . Smokeless tobacco: Never Used  . Alcohol use 0.0 oz/week     Comment: occ     Allergies   Latex; Flu virus vaccine; Codeine; Lactose intolerance (gi); Mobic [meloxicam]; and Sulfa antibiotics   Review of Systems Review of Systems  Gastrointestinal: Negative for anorexia.  Neurological: Positive for speech difficulty and headaches (after hitting head 10 days ago). Negative for weakness.  All other systems reviewed and are negative.    Physical Exam Updated Vital Signs BP 130/85   Pulse 64   Temp 97.9 F (36.6 C) (Oral)   Resp 16   Ht 5\' 5"  (1.651 m)   Wt 190 lb (86.2 kg)   LMP 05/08/2016   SpO2 97%   BMI 31.62 kg/m   Physical Exam  Constitutional: She appears well-developed and well-nourished. No distress.  HENT:  Head: Normocephalic and atraumatic.  Eyes: Conjunctivae are normal.  Neck: Neck supple.  Cardiovascular: Normal rate and regular rhythm.   No murmur heard. Pulmonary/Chest: Effort normal and breath sounds normal. No respiratory distress.  Abdominal: Soft. There is no tenderness.  Musculoskeletal: She exhibits no edema or deformity.  Neurological: She is alert. No cranial nerve deficit.  No altered mental status, able to give full seemingly accurate history.  Face is symmetric, EOM's intact, pupils equal and reactive, vision intact, tongue and uvula midline without deviation Upper and Lower extremity motor 5/5, intact pain perception in distal extremities but slightly decreased strength in RLE, 2+ reflexes in biceps, patella and achilles tendons. Finger to nose normal.   Skin: Skin is warm and dry.  Psychiatric: She has a normal mood and affect.  Nursing note and vitals reviewed.    ED Treatments / Results  Labs (all labs ordered are listed, but only abnormal results are displayed) Labs Reviewed - No data to display  EKG  EKG Interpretation None       Radiology Ct Head Wo Contrast  Result Date: 06/10/2016 CLINICAL DATA:   [address] 1 week ago and hit head. Persistent headache and dizziness and neck stiffness. EXAM: CT HEAD WITHOUT CONTRAST CT CERVICAL SPINE WITHOUT CONTRAST TECHNIQUE: Multidetector CT imaging of the head and cervical spine was performed following the standard protocol without intravenous contrast. Multiplanar CT image reconstructions of the cervical spine were also generated. COMPARISON:  None. FINDINGS: CT HEAD FINDINGS Brain: The ventricles are normal in size and configuration. No extra-axial fluid collections are identified. The gray-white differentiation is normal. No CT findings for acute intracranial process such as hemorrhage or infarction. No mass lesions. The brainstem and cerebellum are grossly normal. Benign-appearing dural calcification noted in the left occipital area Vascular: No hyperdense vessel or unexpected calcification.  Skull: No acute skull fracture. Sinuses/Orbits: The paranasal sinuses and mastoid air cells are clear. The globes are intact. Other: Numerous small scalp calcifications are noted.  No hematoma. CT CERVICAL SPINE FINDINGS Alignment: Normal Skull base and vertebrae: No acute fracture. Soft tissues and spinal canal: No abnormal prevertebral soft tissue swelling. The spinal canal generous. No canal compromise. Disc levels: No significant disc protrusions, spinal or foraminal stenosis. Disc disease and facet disease noted at C5-6 and C6-7. Upper chest: The lung apices are grossly clear. No neck masses. The thyroid gland is normal. Other: None IMPRESSION: 1. No acute intracranial findings or skull fracture. 2. Mild degenerate disc disease and facet disease at C5-6 and C6-7 but no acute cervical spine fracture Electronically Signed   By: Marijo Sanes M.D.   On: 06/10/2016 11:13   Ct Cervical Spine Wo Contrast  Result Date: 06/10/2016 CLINICAL DATA:  [address] 1 week ago and hit head. Persistent headache and dizziness and neck stiffness. EXAM: CT HEAD WITHOUT CONTRAST CT CERVICAL SPINE WITHOUT  CONTRAST TECHNIQUE: Multidetector CT imaging of the head and cervical spine was performed following the standard protocol without intravenous contrast. Multiplanar CT image reconstructions of the cervical spine were also generated. COMPARISON:  None. FINDINGS: CT HEAD FINDINGS Brain: The ventricles are normal in size and configuration. No extra-axial fluid collections are identified. The gray-white differentiation is normal. No CT findings for acute intracranial process such as hemorrhage or infarction. No mass lesions. The brainstem and cerebellum are grossly normal. Benign-appearing dural calcification noted in the left occipital area Vascular: No hyperdense vessel or unexpected calcification. Skull: No acute skull fracture. Sinuses/Orbits: The paranasal sinuses and mastoid air cells are clear. The globes are intact. Other: Numerous small scalp calcifications are noted.  No hematoma. CT CERVICAL SPINE FINDINGS Alignment: Normal Skull base and vertebrae: No acute fracture. Soft tissues and spinal canal: No abnormal prevertebral soft tissue swelling. The spinal canal generous. No canal compromise. Disc levels: No significant disc protrusions, spinal or foraminal stenosis. Disc disease and facet disease noted at C5-6 and C6-7. Upper chest: The lung apices are grossly clear. No neck masses. The thyroid gland is normal. Other: None IMPRESSION: 1. No acute intracranial findings or skull fracture. 2. Mild degenerate disc disease and facet disease at C5-6 and C6-7 but no acute cervical spine fracture Electronically Signed   By: Marijo Sanes M.D.   On: 06/10/2016 11:13    Procedures Procedures (including critical care time)  Medications Ordered in ED Medications - No data to display   Initial Impression / Assessment and Plan / ED Course  I have reviewed the triage vital signs and the nursing notes.  Pertinent labs & imaging results that were available during my care of the patient were reviewed by me and  considered in my medical decision making (see chart for details).  Clinical Course   Likely post concussive syndrome. Low suspicion for CVA/Psych or encephalitis/meningitis.  Ct negative, will advise rest, neurology follow up.   Final Clinical Impressions(s) / ED Diagnoses   Final diagnoses:  Post concussive syndrome    New Prescriptions Current Discharge Medication List       Merrily Pew, MD 06/10/16 1131

## 2016-06-10 NOTE — ED Notes (Signed)
Papers reviewed and understanding verbalized and expresses intent to keep follow up appointments

## 2016-06-15 ENCOUNTER — Ambulatory Visit (INDEPENDENT_AMBULATORY_CARE_PROVIDER_SITE_OTHER): Payer: BC Managed Care – PPO | Admitting: Adult Health

## 2016-06-15 ENCOUNTER — Other Ambulatory Visit (INDEPENDENT_AMBULATORY_CARE_PROVIDER_SITE_OTHER): Payer: BC Managed Care – PPO

## 2016-06-15 VITALS — BP 126/78 | Temp 98.6°F | Ht 65.0 in | Wt 186.8 lb

## 2016-06-15 DIAGNOSIS — Z0000 Encounter for general adult medical examination without abnormal findings: Secondary | ICD-10-CM

## 2016-06-15 DIAGNOSIS — J0140 Acute pansinusitis, unspecified: Secondary | ICD-10-CM | POA: Diagnosis not present

## 2016-06-15 LAB — POC URINALSYSI DIPSTICK (AUTOMATED)
GLUCOSE UA: NEGATIVE
Nitrite, UA: NEGATIVE
PH UA: 6.5
SPEC GRAV UA: 1.025
Urobilinogen, UA: 1

## 2016-06-15 LAB — CBC WITH DIFFERENTIAL/PLATELET
BASOS ABS: 0 10*3/uL (ref 0.0–0.1)
Basophils Relative: 0.4 % (ref 0.0–3.0)
EOS ABS: 0.2 10*3/uL (ref 0.0–0.7)
Eosinophils Relative: 1.8 % (ref 0.0–5.0)
HEMATOCRIT: 37.4 % (ref 36.0–46.0)
Hemoglobin: 12.7 g/dL (ref 12.0–15.0)
LYMPHS ABS: 2.6 10*3/uL (ref 0.7–4.0)
LYMPHS PCT: 26.8 % (ref 12.0–46.0)
MCHC: 33.9 g/dL (ref 30.0–36.0)
MCV: 84 fl (ref 78.0–100.0)
MONOS PCT: 8 % (ref 3.0–12.0)
Monocytes Absolute: 0.8 10*3/uL (ref 0.1–1.0)
NEUTROS ABS: 6 10*3/uL (ref 1.4–7.7)
NEUTROS PCT: 63 % (ref 43.0–77.0)
PLATELETS: 336 10*3/uL (ref 150.0–400.0)
RBC: 4.46 Mil/uL (ref 3.87–5.11)
RDW: 13.6 % (ref 11.5–15.5)
WBC: 9.6 10*3/uL (ref 4.0–10.5)

## 2016-06-15 LAB — BASIC METABOLIC PANEL
BUN: 12 mg/dL (ref 6–23)
CALCIUM: 9.2 mg/dL (ref 8.4–10.5)
CO2: 28 meq/L (ref 19–32)
CREATININE: 0.67 mg/dL (ref 0.40–1.20)
Chloride: 104 mEq/L (ref 96–112)
GFR: 98.39 mL/min (ref >60.00)
Glucose, Bld: 84 mg/dL (ref 70–99)
Potassium: 3.7 mEq/L (ref 3.5–5.1)
SODIUM: 140 meq/L (ref 135–145)

## 2016-06-15 LAB — HEPATIC FUNCTION PANEL
ALBUMIN: 4.2 g/dL (ref 3.5–5.2)
ALK PHOS: 67 U/L (ref 39–117)
ALT: 11 U/L (ref 0–35)
AST: 16 U/L (ref 0–37)
BILIRUBIN DIRECT: 0.1 mg/dL (ref 0.0–0.3)
Total Bilirubin: 0.5 mg/dL (ref 0.2–1.2)
Total Protein: 7.1 g/dL (ref 6.0–8.3)

## 2016-06-15 LAB — LIPID PANEL
CHOL/HDL RATIO: 3
Cholesterol: 159 mg/dL (ref 0–200)
HDL: 49.7 mg/dL (ref >39.00)
LDL Cholesterol: 98 mg/dL (ref 0–99)
NONHDL: 109.55
Triglycerides: 57 mg/dL (ref 0.0–149.0)
VLDL: 11.4 mg/dL (ref 0.0–40.0)

## 2016-06-15 LAB — TSH: TSH: 1.81 u[IU]/mL (ref 0.35–4.50)

## 2016-06-15 MED ORDER — DOXYCYCLINE HYCLATE 100 MG PO CAPS: 100.0000 mg | ORAL_CAPSULE | Freq: Two times a day (BID) | ORAL | 0 refills | Status: DC

## 2016-06-15 NOTE — Progress Notes (Signed)
Subjective:    Patient ID: Samantha Delacruz, female    DOB: October 29, 1965, 50 y.o.   MRN: [id number]  Sinusitis  This is a new problem. The current episode started in the past 7 days. The problem has been gradually worsening since onset. There has been no fever. Associated symptoms include congestion, diaphoresis, headaches, a hoarse voice, sinus pressure and a sore throat. Pertinent negatives include no ear pain or shortness of breath. Past treatments include nothing. The treatment provided no relief.   Was recently seen in the emergency room for postconcussive syndrome. She reports that her headache is not improved sinus infection has exacerbated her headache   Review of Systems  Constitutional: Positive for activity change and diaphoresis.  HENT: Positive for congestion, hoarse voice, postnasal drip, rhinorrhea, sinus pressure and sore throat. Negative for ear discharge, ear pain and trouble swallowing.   Eyes: Positive for photophobia. Negative for visual disturbance.  Respiratory: Negative.  Negative for shortness of breath.   Cardiovascular: Negative.   Musculoskeletal: Negative.   Neurological: Positive for headaches. Negative for dizziness, light-headedness and numbness.  Hematological: Negative.    Past Medical History:  Diagnosis Date  . Adult ADHD   . Anemia    history of  . Asthma   . Headache    Migraines  . Heart palpitations   . Hemorrhoids, internal, with bleeding and mucous discharge - Grade 1 02/05/2014  . Migraines   . Pneumonia   . Restless leg   . Rosacea   . Vertigo     Social History   Social History  . Marital status: Married    Spouse name: N/A  . Number of children: 1  . Years of education: N/A   Occupational History  .  [employer] History Main Topics  . Smoking status: Never Smoker  . Smokeless tobacco: Never Used  . Alcohol use 0.0 oz/week     Comment: occ  . Drug use: No  . Sexual activity: Not Currently   Comment: 1st intercourse 50 yo-Fewer than 5 partners   Other Topics Concern  . 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 CESAREAN SECTION  08/15/92   son  . DILATATION & CURETTAGE/HYSTEROSCOPY WITH MYOSURE N/A 04/19/2016   Procedure: DILATATION & CURETTAGE/HYSTEROSCOPY WITH MYOSURE;  Surgeon: Anastasio Auerbach, MD;  Location: [facility];  Service: Gynecology;  Laterality: N/A;  request 7:30am start time   Requests one hour OR time.     Family History  Problem Relation Age of Onset  . Depression Mother   . Diabetes Mother   . Emphysema Mother   . Stroke Mother   . Cancer Mother     uterine   . Heart disease Father   . Heart failure Father   . Thyroid disease Sister   . Thyroid disease Brother   . ADD / ADHD Son   . Stroke Maternal Grandmother   . Cancer Paternal Grandfather     esophgus   . Colon cancer Neg Hx     Allergies  Allergen Reactions  . Latex Rash  . Flu Virus Vaccine Nausea And Vomiting  . Codeine Hives  . Lactose Intolerance (Gi) Diarrhea  cramping  . Mobic [Meloxicam]   . Sulfa Antibiotics Hives    Current Outpatient Prescriptions on File Prior to Visit  Medication Sig Dispense Refill  . ibuprofen (ADVIL,MOTRIN) 200 MG tablet Take 600 mg by mouth every 6 (six) hours as needed for moderate pain.    Marland Kitchen loratadine (CLARITIN) 10 MG tablet Take 10 mg by mouth daily.      . Multiple Vitamin (MULTIVITAMIN) tablet Take 1 tablet by mouth daily.    Marland Kitchen albuterol (PROVENTIL HFA;VENTOLIN HFA) 108 (90 BASE) MCG/ACT inhaler Inhale 2 puffs into the lungs every 4 (four) hours as needed for wheezing or shortness of breath. (Patient not taking: Reported on 06/15/2016) 1 Inhaler 2  . Fluticasone-Salmeterol (ADVAIR DISKUS) 250-50 MCG/DOSE AEPB Inhale 1 puff into the lungs 2  (two) times daily. (Patient not taking: Reported on 06/15/2016) 60 each 11   No current facility-administered medications on file prior to visit.     BP 126/78   Temp 98.6 F (37 C) (Oral)   Ht 5\' 5"  (1.651 m)   Wt 186 lb 12.8 oz (84.7 kg)   LMP 05/08/2016   BMI 31.09 kg/m       Objective:   Physical Exam  Constitutional: She is oriented to person, place, and time. She appears well-developed and well-nourished. She appears ill. No distress.  HENT:  Head: Normocephalic and atraumatic.  Right Ear: Hearing, tympanic membrane, external ear and ear canal normal.  Left Ear: Hearing, tympanic membrane, external ear and ear canal normal.  Nose: Mucosal edema and rhinorrhea present. Right sinus exhibits maxillary sinus tenderness and frontal sinus tenderness. Left sinus exhibits maxillary sinus tenderness and frontal sinus tenderness.  Mouth/Throat: Uvula is midline and mucous membranes are normal. Oropharyngeal exudate and posterior oropharyngeal erythema present. No posterior oropharyngeal edema or tonsillar abscesses.  Eyes: Conjunctivae and EOM are normal. Pupils are equal, round, and reactive to light.  Cardiovascular: Normal rate, regular rhythm, normal heart sounds and intact distal pulses.  Exam reveals no gallop and no friction rub.   No murmur heard. Pulmonary/Chest: Effort normal and breath sounds normal. No respiratory distress. She has no wheezes. She has no rales. She exhibits no tenderness.  Musculoskeletal: Normal range of motion. She exhibits no edema, tenderness or deformity.  Neurological: She is alert and oriented to person, place, and time.  Skin: Skin is warm and dry. No rash noted. She is not diaphoretic. No erythema. No pallor.  Psychiatric: She has a normal mood and affect. Her behavior is normal. Thought content normal.  Nursing note and vitals reviewed.     Assessment & Plan:  1. Acute pansinusitis, recurrence not specified - doxycycline (VIBRAMYCIN) 100 MG  capsule; Take 1 capsule (100 mg total) by mouth 2 (two) times daily.  Dispense: 14 capsule; Refill: 0 - Use flonase - Follow-up if no improvement in the next 2 or 3 days or sooner if needed  Dorothyann Peng, NP

## 2016-06-20 ENCOUNTER — Encounter: Payer: Self-pay | Admitting: Family Medicine

## 2016-06-20 ENCOUNTER — Ambulatory Visit (INDEPENDENT_AMBULATORY_CARE_PROVIDER_SITE_OTHER): Payer: BC Managed Care – PPO | Admitting: Family Medicine

## 2016-06-20 VITALS — BP 150/100 | HR 89 | Temp 98.4°F | Ht 64.5 in | Wt 183.9 lb

## 2016-06-20 DIAGNOSIS — E663 Overweight: Secondary | ICD-10-CM | POA: Diagnosis not present

## 2016-06-20 DIAGNOSIS — F32A Depression, unspecified: Secondary | ICD-10-CM

## 2016-06-20 DIAGNOSIS — Z0000 Encounter for general adult medical examination without abnormal findings: Secondary | ICD-10-CM | POA: Diagnosis not present

## 2016-06-20 DIAGNOSIS — J309 Allergic rhinitis, unspecified: Secondary | ICD-10-CM | POA: Diagnosis not present

## 2016-06-20 DIAGNOSIS — S060X0D Concussion without loss of consciousness, subsequent encounter: Secondary | ICD-10-CM

## 2016-06-20 DIAGNOSIS — F329 Major depressive disorder, single episode, unspecified: Secondary | ICD-10-CM

## 2016-06-20 MED ORDER — CITALOPRAM HYDROBROMIDE 20 MG PO TABS: 20.0000 mg | ORAL_TABLET | Freq: Every day | ORAL | 3 refills | Status: DC

## 2016-06-20 MED ORDER — ALBUTEROL SULFATE HFA 108 (90 BASE) MCG/ACT IN AERS: 2.0000 | INHALATION_SPRAY | RESPIRATORY_TRACT | 2 refills | Status: DC | PRN

## 2016-06-20 NOTE — Progress Notes (Signed)
Pre visit review using our clinic review tool, if applicable. No additional management support is needed unless otherwise documented below in the visit note. 

## 2016-06-20 NOTE — Patient Instructions (Signed)
Begin Celexa 20 mg.......Marland Kitchen 1 at bedtime  Continue allergy medicine  Call Dr. Tomasita Crumble [facility]......... and asked to be seen ASAP  For the chest wall pain I would recommend Motrin 600 mg twice daily with food

## 2016-06-20 NOTE — Progress Notes (Signed)
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 Who comes in today for complete physical examination  She tells me over the last month or so she's had some issues that concern her. On October 17 she fell at school and hit her head. She went to a [facility]. evaluation which was apparently negative. No couple days later she began to feel really bad. She says the headache was worse and she felt "small". She went back to [facility] the emergency room. Neurologic exam and brain scan were all normal. She was told she had a mild concussion. She's also complaining of chest pain. She says hurts in the middle left right comes and goes. She is also very concerned because since she had her head she felt very depressed. She says her mother had attempted suicide in the past. Her father was an alcoholic. She herself has never felt this depressed in the past. She's had spells but they lasted a day or 2 or 3 in the went away.  She gets routine eye care, dental care, colonoscopy 2016 normal, BSE monthly, mammography November 2016 normal. Pelvic and packed by Dr. Abbie Sons her gynecologist.  She was on Advair 1 puff twice a day for chronic low-grade asthma however she stopped that. She now uses albuterol when necessary. She takes Claritin for allergic rhinitis.  She also tells me that in 2001 she went to see a psychiatrist. She was diagnosed of adult ADD. She was given Adderall for year but stopped it because of GI cardiac and nervous side effects.  Vaccination history tetanus 2013 she declines a flu shot.  Review of systems otherwise negative  14 point review of systems reviewed are negative  Physical examination.vs .BP (!) 150/100 (BP Location: Right Arm, Patient Position: Sitting, Cuff Size: Normal)   Pulse 89   Temp 98.4 F (36.9 C) (Oral)   Ht 5' 4.5" (1.638 m)   Wt 183 lb 14.4 oz (83.4 kg)   LMP 06/07/2016 (LMP Unknown)   SpO2 98%   BMI 31.08  kg/m  Examination of the HEENT were negative neck was supple no adenopathy thyroid normal cardiopulmonary exam normal breast pelvic rectal done by GYN therefore not repeated. Abdominal exam negative extremities normal skin normal peripheral pulses normal  Impression #1 concussion......... symptoms slowly resolving reassured  #2 chest wall pain.... Reassured  #3 depression....... consult with Dr. Tomasita Crumble [facility] group  Allergic rhinitis continue Claritin.

## 2016-12-29 ENCOUNTER — Encounter: Payer: Self-pay | Admitting: Family Medicine

## 2016-12-29 ENCOUNTER — Ambulatory Visit (INDEPENDENT_AMBULATORY_CARE_PROVIDER_SITE_OTHER): Payer: BC Managed Care – PPO | Admitting: Family Medicine

## 2016-12-29 VITALS — BP 120/90 | HR 83 | Temp 98.4°F | Ht 64.5 in | Wt 187.2 lb

## 2016-12-29 DIAGNOSIS — J309 Allergic rhinitis, unspecified: Secondary | ICD-10-CM

## 2016-12-29 DIAGNOSIS — R196 Halitosis: Secondary | ICD-10-CM

## 2016-12-29 DIAGNOSIS — H66002 Acute suppurative otitis media without spontaneous rupture of ear drum, left ear: Secondary | ICD-10-CM

## 2016-12-29 DIAGNOSIS — W57XXXA Bitten or stung by nonvenomous insect and other nonvenomous arthropods, initial encounter: Secondary | ICD-10-CM | POA: Diagnosis not present

## 2016-12-29 MED ORDER — AMOXICILLIN 875 MG PO TABS: 875.0000 mg | ORAL_TABLET | Freq: Two times a day (BID) | ORAL | 0 refills | Status: DC

## 2016-12-29 NOTE — Progress Notes (Signed)
HPI:  Acute visit for several issues:  1) Tick bite: -remove tick 3 weeks ago -picks at this area since and is itchy at times -wants to check to ensure not infected -no fevers, malaise, jt pain, rash, myalgias, etc  2) bad breath: -for 1-2 weeks -has allergies, drainage, PND -not taking her allergy medications "like I should" -no fevers, sob, asthma symptoms, fevers, malaise, sore throat  ROS: See pertinent positives and negatives per HPI.  Past Medical History:  Diagnosis Date  . Adult ADHD   . Anemia    history of  . Asthma   . Headache    Migraines  . Heart palpitations   . Hemorrhoids, internal, with bleeding and mucous discharge - Grade 1 02/05/2014  . Migraines   . Pneumonia   . Restless leg   . Rosacea   . Vertigo     Past Surgical History:  Procedure Laterality Date  . CESAREAN SECTION  08/15/92   son  . DILATATION & CURETTAGE/HYSTEROSCOPY WITH MYOSURE N/A 04/19/2016   Procedure: DILATATION & CURETTAGE/HYSTEROSCOPY WITH MYOSURE;  Surgeon: Anastasio Auerbach, MD;  Location: [facility];  Service: Gynecology;  Laterality: N/A;  request 7:30am start time   Requests one hour OR time.     Family History  Problem Relation Age of Onset  . Depression Mother   . Diabetes Mother   . Emphysema Mother   . Stroke Mother   . Cancer Mother        uterine   . Heart disease Father   . Heart failure Father   . Thyroid disease Sister   . Thyroid disease Brother   . ADD / ADHD Son   . Stroke Maternal Grandmother   . Cancer Paternal Grandfather        esophgus   . Colon cancer Neg Hx     Social History   Social History  . Marital status: Married    Spouse name: N/A  . Number of children: 1  . Years of education: N/A   Occupational History  .  [place] History Main Topics  . Smoking status: Never Smoker  . Smokeless tobacco: Never Used  . Alcohol use 0.0 oz/week     Comment: occ  . Drug use: No  . Sexual activity: Not Currently   Comment: 1st intercourse 51 yo-Fewer than 5 partners   Other Topics Concern  . 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  albuterol (PROVENTIL HFA;VENTOLIN HFA) 108 (90 Base) MCG/ACT inhaler, Inhale 2 puffs into the lungs every 4 (four) hours as needed for wheezing or shortness of breath., Disp: 1 Inhaler, Rfl: 2 .  ibuprofen (ADVIL,MOTRIN) 200 MG tablet, Take 600 mg by mouth every 6 (six) hours as needed for moderate pain., Disp: , Rfl:  .  loratadine (CLARITIN) 10 MG tablet, Take 10 mg by mouth daily.  , Disp: , Rfl:  .  Multiple Vitamin (MULTIVITAMIN) tablet, Take 1 tablet by mouth daily., Disp: , Rfl:  .  amoxicillin (AMOXIL) 875 MG tablet, Take 1 tablet (875 mg total) by mouth 2 (two) times daily., Disp: 20 tablet, Rfl: 0  EXAM:  Vitals:   12/29/16 1642  BP: 120/90  Pulse: 83  Temp:  98.4 F (36.9 C)    Body mass index is 31.64 kg/m.  GENERAL: vitals reviewed and listed above, alert, oriented, appears well hydrated and in no acute distress  HEENT: atraumatic, conjunttiva clear, no obvious abnormalities on inspection of external nose and ears, normal appearance of ear canals and TMs except for purulent effusion with bulging R,  nasal congestion, mild post oropharyngeal erythema with PND, no tonsillar edema or exudate, no sinus TTP  NECK: no obvious masses on inspection  LUNGS: clear to auscultation bilaterally, no wheezes, rales or rhonchi, good air movement  CV: HRRR, no peripheral edema  SKIN: small irritated papule R buttock  MS: moves all extremities without noticeable abnormality  PSYCH: pleasant and cooperative, no obvious depression or anxiety  ASSESSMENT AND PLAN:  Discussed the following assessment and plan:  Tick bite, initial encounter -no signs infection, looks like from  picking -dicussed signs/symptoms tick borne illness  Acute suppurative otitis media of left ear without spontaneous rupture of tympanic membrane, recurrence not specified -amox  Allergic rhinitis, unspecified seasonality, unspecified trigger -restart allergy regimen  Bad breath -likely from allergies, ear infection -advised regular dental care and follow up if persists afta bx  BP a little up. Stress as just separated from husband. Regular follow up with PCP.  -Patient advised to return or notify a doctor immediately if symptoms worsen or persist or new concerns arise.  Patient Instructions  Take the antibiotic as instructed  Take you allergy medications daily  Do not pick the tick bite!  I hope you are feeling better soon! Seek care immediately if worsening, new concerns or you are not improving with treatment.       WE NOW OFFER   [facility] FAST TRACK!!!  SAME DAY Appointments for ACUTE CARE  Such as: Sprains, Injuries, cuts, abrasions, rashes, muscle pain, joint pain, back pain Colds, flu, sore throats, headache, allergies, cough, fever  Ear pain, sinus and eye infections Abdominal pain, nausea, vomiting, diarrhea, upset stomach Animal/insect bites  3 Easy Ways to Schedule: Walk-In Scheduling Call in scheduling Mychart Sign-up: [email or web address]           Colin Benton R., DO

## 2016-12-29 NOTE — Patient Instructions (Signed)
Take the antibiotic as instructed  Take you allergy medications daily  Do not pick the tick bite!  I hope you are feeling better soon! Seek care immediately if worsening, new concerns or you are not improving with treatment.       WE NOW OFFER   [facility] FAST TRACK!!!  SAME DAY Appointments for ACUTE CARE  Such as: Sprains, Injuries, cuts, abrasions, rashes, muscle pain, joint pain, back pain Colds, flu, sore throats, headache, allergies, cough, fever  Ear pain, sinus and eye infections Abdominal pain, nausea, vomiting, diarrhea, upset stomach Animal/insect bites  3 Easy Ways to Schedule: Walk-In Scheduling Call in scheduling Mychart Sign-up: [email or web address]

## 2016-12-30 ENCOUNTER — Encounter: Payer: Self-pay | Admitting: Gynecology

## 2016-12-30 ENCOUNTER — Ambulatory Visit (INDEPENDENT_AMBULATORY_CARE_PROVIDER_SITE_OTHER): Payer: BC Managed Care – PPO | Admitting: Gynecology

## 2016-12-30 VITALS — BP 130/80

## 2016-12-30 DIAGNOSIS — N898 Other specified noninflammatory disorders of vagina: Secondary | ICD-10-CM

## 2016-12-30 DIAGNOSIS — Z3009 Encounter for other general counseling and advice on contraception: Secondary | ICD-10-CM

## 2016-12-30 DIAGNOSIS — R300 Dysuria: Secondary | ICD-10-CM

## 2016-12-30 DIAGNOSIS — L298 Other pruritus: Secondary | ICD-10-CM | POA: Diagnosis not present

## 2016-12-30 LAB — URINALYSIS W MICROSCOPIC + REFLEX CULTURE
BILIRUBIN URINE: NEGATIVE
CASTS: NONE SEEN [LPF]
CRYSTALS: NONE SEEN [HPF]
GLUCOSE, UA: NEGATIVE
KETONES UR: NEGATIVE
Nitrite: NEGATIVE
PROTEIN: NEGATIVE
RBC / HPF: NONE SEEN RBC/HPF (ref <=2)
Specific Gravity, Urine: 1.015 (ref 1.001–1.035)
Yeast: NONE SEEN [HPF]
pH: 7 (ref 5.0–8.0)

## 2016-12-30 LAB — WET PREP FOR TRICH, YEAST, CLUE
CLUE CELLS WET PREP: NONE SEEN
Trich, Wet Prep: NONE SEEN

## 2016-12-30 MED ORDER — FLUCONAZOLE 150 MG PO TABS: 150.0000 mg | ORAL_TABLET | Freq: Once | ORAL | 0 refills | Status: AC

## 2016-12-30 NOTE — Addendum Note (Signed)
Addended by: Nelva Nay on: 12/30/2016 12:18 PM   Modules accepted: Orders

## 2016-12-30 NOTE — Progress Notes (Signed)
    Samantha Delacruz 11-Aug-1966 [id number]        51 y.o.  G1P1 presents with 2 issues:  1. Vaginal itching and slight discharge. Recently started antibiotics for ear infection. Subsequently developed a slight itch and discharge. No odor. No urinary symptoms such as frequency, dysuria or urgency, low back pain, fever or chills. 2. In the process of divorce and wants to talk about contraception. Having regular monthly menses. Status post hysteroscopic resection of endometrial polyps last fall. Menses are lighter than before but still fairly heavy.  Past medical history,surgical history, problem list, medications, allergies, family history and social history were all reviewed and documented in the EPIC chart.  Directed ROS with pertinent positives and negatives documented in the history of present illness/assessment and plan.  Exam: [name] assistant Vitals:   12/30/16 1118  BP: 130/80   General appearance:  Normal Abdomen soft nontender without masses Pelvic external BUS vagina with slight white discharge. Cervix normal. Uterus normal size midline mobile nontender. Adnexa without masses or tenderness.  Assessment/Plan:  51 y.o. G1P1 with:  1. History and exam as well as wet prep consistent with yeast vaginitis. Will treat with Diflucan 150 mg now and repeat dose at the end of her oral antibiotics. Follow up if symptoms persist, worsen or recur. 2. Contraceptive management. Reviewed contraceptive options to include pill, patch, ring, Depo-Provera, Nexplanon, IUD, sterilization. The pros/cons, risks/benefits of each choice reviewed. I think Mirena IUD would be an ideal choice for her to provide not only assured contraception but also menstrual suppression given her heavier menses. I reviewed the insertional process with her as well as the risks to include infection, perforation/migration requiring surgery to remove, hormonal absorption with side effects and failure risks. It should  surely see her through menopause. Patient wants to proceed with this and will schedule this during her menses.    Anastasio Auerbach MD, 11:38 AM 12/30/2016

## 2016-12-30 NOTE — Patient Instructions (Signed)
Take diflucan pill now and at the end of the antibiotics Follow up for IUD as scheduled

## 2016-12-31 LAB — URINE CULTURE: Organism ID, Bacteria: NO GROWTH

## 2017-01-11 ENCOUNTER — Telehealth: Payer: Self-pay | Admitting: *Deleted

## 2017-01-11 NOTE — Telephone Encounter (Signed)
Pt informed with negative culture results on 12/30/16

## 2017-01-25 ENCOUNTER — Encounter: Payer: Self-pay | Admitting: Women's Health

## 2017-01-25 ENCOUNTER — Ambulatory Visit (INDEPENDENT_AMBULATORY_CARE_PROVIDER_SITE_OTHER): Payer: BC Managed Care – PPO | Admitting: Women's Health

## 2017-01-25 VITALS — BP 132/80 | Ht 64.0 in | Wt 178.0 lb

## 2017-01-25 DIAGNOSIS — Z113 Encounter for screening for infections with a predominantly sexual mode of transmission: Secondary | ICD-10-CM | POA: Diagnosis not present

## 2017-01-25 DIAGNOSIS — N898 Other specified noninflammatory disorders of vagina: Secondary | ICD-10-CM

## 2017-01-25 LAB — WET PREP FOR TRICH, YEAST, CLUE
Clue Cells Wet Prep HPF POC: NONE SEEN
TRICH WET PREP: NONE SEEN
YEAST WET PREP: NONE SEEN

## 2017-01-25 NOTE — Progress Notes (Signed)
Presents: 28-month intermittent history of vaginal discharge with odor, irritation and vaginal burning since resuming sexual activity with a new partner.  Reports being newly separated but not officially divorced not sexually active for greater than 10 years. No condoms/ no contraception/vasectomy. Latex allergy and has used sex toys of unknown material.  Irregular cycles 5-7 days in length. Denies dysuria, urinary frequency, nocturia.  States she was tested for STD's/HIV prior to sexual activity with new partner and results were negative.  PMH significant for ear infection treated with abx 2-3 weeks ago and yeast infection following treated with Diflucan..   Exam: Well-appearing. No acute distress. External genitalia-erythematous at introitus and labia minora. Speculum exam normal discharge, Vaginal wall: erythematous.  Wet prep: negative.  Diagnosis: Perimenopausal/Irregular cycles/no contraception/vasectomy Clinical  vaginitis STD screening   Plan:  Diflucan 150 mg tablet taken once orally with refill. GC/Chlamydia, Hep B, Hep C, HIV, RPR. Condoms encouraged until permanent partner. If Irregular bleeding persists Mirena IUD, will schedule with Dr. Phineas Real. STI/HIV prevention discussed and reviewed. Avoid latex sex toys to minimize vaginal irritation. Yeast prevention discussed, loose clothing recommended. to reduce irritation. Instructed to follow up if no improvement with Diflucan.

## 2017-01-26 LAB — HEPATITIS C ANTIBODY: HCV Ab: NEGATIVE

## 2017-01-26 LAB — HEPATITIS B SURFACE ANTIGEN: Hepatitis B Surface Ag: NEGATIVE

## 2017-01-26 LAB — GC/CHLAMYDIA PROBE AMP
CT Probe RNA: NOT DETECTED
GC Probe RNA: NOT DETECTED

## 2017-01-26 LAB — RPR

## 2017-01-26 LAB — HIV ANTIBODY (ROUTINE TESTING W REFLEX): HIV 1&2 Ab, 4th Generation: NONREACTIVE

## 2017-01-27 ENCOUNTER — Encounter: Payer: Self-pay | Admitting: Women's Health

## 2017-01-27 ENCOUNTER — Other Ambulatory Visit: Payer: Self-pay | Admitting: Women's Health

## 2017-01-27 ENCOUNTER — Telehealth: Payer: Self-pay

## 2017-01-27 MED ORDER — FLUCONAZOLE 150 MG PO TABS: 150.0000 mg | ORAL_TABLET | Freq: Once | ORAL | 1 refills | Status: AC

## 2017-01-27 NOTE — Telephone Encounter (Signed)
Patient called to follow up on test results from her visit. Also, she said [place] was to send Rx and she went to pharmacy and not there.   I do see Diflucan was not sent to pharmacy so per office note I went ahead and sent it to her pharmacy.    I tried to call patient back at # she provided in message [phone] but there was no voice mail set up to leave a message.

## 2017-01-27 NOTE — Telephone Encounter (Signed)
Patient called back. Informed neg results and Rx sent.

## 2017-02-20 ENCOUNTER — Other Ambulatory Visit: Payer: Self-pay | Admitting: Gynecology

## 2017-02-20 ENCOUNTER — Telehealth: Payer: Self-pay | Admitting: Obstetrics & Gynecology

## 2017-02-20 ENCOUNTER — Ambulatory Visit (INDEPENDENT_AMBULATORY_CARE_PROVIDER_SITE_OTHER): Payer: BC Managed Care – PPO | Admitting: Gynecology

## 2017-02-20 ENCOUNTER — Encounter: Payer: Self-pay | Admitting: Gynecology

## 2017-02-20 VITALS — BP 120/78

## 2017-02-20 DIAGNOSIS — N3000 Acute cystitis without hematuria: Secondary | ICD-10-CM

## 2017-02-20 DIAGNOSIS — N898 Other specified noninflammatory disorders of vagina: Secondary | ICD-10-CM | POA: Diagnosis not present

## 2017-02-20 LAB — WET PREP FOR TRICH, YEAST, CLUE
Clue Cells Wet Prep HPF POC: NONE SEEN
TRICH WET PREP: NONE SEEN
Yeast Wet Prep HPF POC: NONE SEEN

## 2017-02-20 LAB — URINALYSIS W MICROSCOPIC + REFLEX CULTURE
Bilirubin Urine: NEGATIVE
Casts: NONE SEEN [LPF]
Crystals: NONE SEEN [HPF]
GLUCOSE, UA: NEGATIVE
Ketones, ur: NEGATIVE
Nitrite: POSITIVE — AB
PH: 7 (ref 5.0–8.0)
Protein, ur: NEGATIVE
SPECIFIC GRAVITY, URINE: 1.015 (ref 1.001–1.035)
Yeast: NONE SEEN [HPF]

## 2017-02-20 MED ORDER — FLUCONAZOLE 150 MG PO TABS: 150.0000 mg | ORAL_TABLET | Freq: Once | ORAL | 0 refills | Status: AC

## 2017-02-20 MED ORDER — CIPROFLOXACIN HCL 250 MG PO TABS
250.0000 mg | ORAL_TABLET | Freq: Two times a day (BID) | ORAL | 0 refills | Status: DC
Start: 2017-02-20 — End: 2017-04-03

## 2017-02-20 NOTE — Patient Instructions (Signed)
Take the ciprofloxacin antibiotic twice daily for 3 days. Take the Diflucan pill once.  Follow up if your symptoms persist, worsen or recur.

## 2017-02-20 NOTE — Telephone Encounter (Signed)
[facility] called with abnormal, after hours, results that required communication with provider on call.  I spoke with Velna Hatchet at [facility] lab.  Urine micro showed the following:  Cloudy appearance, +1 occult blood, +nitrites, 3+ leuk esterase, packed WBCs.  RBCs 10-20, squam epithelial cells 10-20, many bacteria.  Culture still pending.  Note reviewed.  Pt seen today with UTI symptoms.  Treated with Ciprofloxacin 250mg  bid x 3 days.    Will forward to Dr. Phineas Real.

## 2017-02-20 NOTE — Addendum Note (Signed)
Addended by: Nelva Nay on: 02/20/2017 12:13 PM   Modules accepted: Orders

## 2017-02-20 NOTE — Progress Notes (Signed)
    Samantha Delacruz 16-Mar-1966 [id number]        51 y.o.  G1P1 presents with 2-3 day history of worsening dysuria and suprapubic pressure with "bladder spasms". No low back pain, fever or chills. Having some urgency and frequency. This started following intercourse. Also notes some vaginal itching over the last day or 2. Was recently treated for yeast vaginitis in May. No vaginal odor. No nausea vomiting diarrhea constipation.  Past medical history,surgical history, problem list, medications, allergies, family history and social history were all reviewed and documented in the EPIC chart.  Directed ROS with pertinent positives and negatives documented in the history of present illness/assessment and plan.  Exam: [name] assistant Vitals:   02/20/17 1105  BP: 120/78   General appearance:  Normal Spine straight without CVA tenderness Abdomen with mild suprapubic tenderness. No masses guarding rebound Pelvic external BUS vagina with white discharge. Bimanual without masses or discharge.  Assessment/Plan:  51 y.o. G1P1 with history as above. Urinalysis consistent with UTI. Will treat with ciprofloxacin 250 mg twice a day 3 days. Wet prep was negative. Given the itching, past history of success with Diflucan and current treatment with antibiotics am going to cover her with Diflucan 150 mg 1 day. Follow up if symptoms persist, worsen or recur.    Anastasio Auerbach MD, 11:13 AM 02/20/2017

## 2017-02-22 LAB — URINE CULTURE

## 2017-02-23 ENCOUNTER — Telehealth: Payer: Self-pay | Admitting: *Deleted

## 2017-02-23 MED ORDER — NITROFURANTOIN MONOHYD MACRO 100 MG PO CAPS: 100.0000 mg | ORAL_CAPSULE | Freq: Two times a day (BID) | ORAL | 0 refills | Status: DC

## 2017-02-23 NOTE — Telephone Encounter (Signed)
Call in [place] one by mouth twice a day for 7 days. I saw her urine culture and sensitivity and the microorganism identified will respond to this medication as well.

## 2017-02-23 NOTE — Telephone Encounter (Signed)
Pt informed, Rx sent. 

## 2017-02-23 NOTE — Telephone Encounter (Signed)
(  TF patient) pt was treated for UTI on 02/20/17 ciprofloxacin 250 mg twice a day 3 days, pt said she doesn't feel 100% still has bladder spasms and slight pressure, states she feels better, just not 100%. Asked if refill should be given? Please advise

## 2017-04-03 ENCOUNTER — Ambulatory Visit (INDEPENDENT_AMBULATORY_CARE_PROVIDER_SITE_OTHER): Payer: BC Managed Care – PPO | Admitting: Family Medicine

## 2017-04-03 ENCOUNTER — Encounter: Payer: Self-pay | Admitting: Family Medicine

## 2017-04-03 VITALS — BP 118/80 | HR 81 | Temp 98.3°F | Wt 173.2 lb

## 2017-04-03 DIAGNOSIS — B353 Tinea pedis: Secondary | ICD-10-CM | POA: Diagnosis not present

## 2017-04-03 MED ORDER — FLUCONAZOLE 100 MG PO TABS: 100.0000 mg | ORAL_TABLET | Freq: Every day | ORAL | 0 refills | Status: DC

## 2017-04-03 MED ORDER — NAFTIFINE HCL 1 % EX CREA: TOPICAL_CREAM | Freq: Every day | CUTANEOUS | 1 refills | Status: DC

## 2017-04-03 NOTE — Progress Notes (Signed)
Subjective:     Patient ID: Samantha Delacruz, female   DOB: 1965-11-19, 51 y.o.   MRN: [id number]  HPI Patient seen with possible athlete's foot left foot between the fourth and fifth toes. She's had about 3 weeks of some itching and burning. She soaked foot in  saltwater yesterday which temporarily relieve itching. She tried some over-the-counter antifungal spray (?Tinactin) without much improvement. No history of diabetes. No right foot involvement.  Past Medical History:  Diagnosis Date  . Adult ADHD   . Anemia    history of  . Asthma   . Headache    Migraines  . Heart palpitations   . Hemorrhoids, internal, with bleeding and mucous discharge - Grade 1 02/05/2014  . Migraines   . Pneumonia   . Restless leg   . Rosacea   . Vertigo    Past Surgical History:  Procedure Laterality Date  . CESAREAN SECTION  08/15/92   son  . DILATATION & CURETTAGE/HYSTEROSCOPY WITH MYOSURE N/A 04/19/2016   Procedure: DILATATION & CURETTAGE/HYSTEROSCOPY WITH MYOSURE;  Surgeon: Anastasio Auerbach, MD;  Location: [facility];  Service: Gynecology;  Laterality: N/A;  request 7:30am start time   Requests one hour OR time.     reports that she has never smoked. She has never used smokeless tobacco. She reports that she drinks alcohol. She reports that she does not use drugs. family history includes ADD / ADHD in her son; Cancer in her mother and paternal grandfather; Depression in her mother; Diabetes in her mother; Emphysema in her mother; Heart disease in her father; Heart failure in her father; Stroke in her maternal grandmother and mother; Thyroid disease in her brother and sister. Allergies  Allergen Reactions  . Latex Rash  . Flu Virus Vaccine Nausea And Vomiting  . Codeine Hives  . Lactose Intolerance (Gi) Diarrhea    cramping  . Mobic [Meloxicam]   . Sulfa Antibiotics Hives     Review of Systems  Constitutional: Negative for chills and fever.  Skin: Positive for rash.       Objective:   Physical Exam  Constitutional: She appears well-developed and well-nourished.  Cardiovascular: Normal rate and regular rhythm.   Pulmonary/Chest: Effort normal and breath sounds normal.  Skin:  Patient scaly rash in the webspace between the left fourth and fifth toes. She has some involvement of the lateral aspect of the fifth digit and slightly involving the fourth digit as well.       Assessment:     Probable tinea pedis left foot    Plan:     -Keep foot dry as possible -Naftin cream once daily for 3-4 weeks. -If not improving with the above consider fluconazole 100 mg daily for 7 days -Touch base if not fully resolving over the next few weeks  Eulas Post MD [facility]

## 2017-04-03 NOTE — Patient Instructions (Signed)
Athlete's Foot Athlete's foot (tinea pedis) is a fungal infection of the skin on the feet. It often occurs on the skin that is between or underneath the toes. It can also occur on the soles of the feet. The infection can spread from person to person (is contagious). What are the causes? Athlete's foot is caused by a fungus. This fungus grows in warm, moist places. Most people get athlete's foot by sharing shower stalls, towels, and wet floors with someone who is infected. Not washing your feet or changing your socks often enough can contribute to athlete's foot. What increases the risk? This condition is more likely to develop in:  Men.  People who have a weak body defense system (immune system).  People who have diabetes.  People who use public showers, such as at a gym.  People who wear heavy-duty shoes, such as industrial or military shoes.  Seasons with warm, humid weather.  What are the signs or symptoms? Symptoms of this condition include:  Itchy areas between the toes or on the soles of the feet.  White, flaky, or scaly areas between the toes or on the soles of the feet.  Very itchy small blisters between the toes or on the soles of the feet.  Small cuts on the skin. These cuts can become infected.  Thick or discolored toenails.  How is this diagnosed? This condition is diagnosed with a medical history and physical exam. Your health care provider may also take a skin or toenail sample to be examined. How is this treated? Treatment for this condition includes antifungal medicines. These may be applied as powders, ointments, or creams. In severe cases, an oral antifungal medicine may be given. Follow these instructions at home:  Apply or take over-the-counter and prescription medicines only as told by your health care provider.  Keep all follow-up visits as told by your health care provider. This is important.  Do not scratch your feet.  Keep your feet dry: ? Wear  cotton or wool socks. Change your socks every day or if they become wet. ? Wear shoes that allow air to circulate, such as sandals or canvas tennis shoes.  Wash and dry your feet: ? Every day or as told by your health care provider. ? After exercising. ? Including the area between your toes.  Do not share towels, nail clippers, or other personal items that touch your feet with others.  If you have diabetes, keep your blood sugar under control. How is this prevented?  Do not share towels.  Wear sandals in wet areas, such as locker rooms and shared showers.  Keep your feet dry: ? Wear cotton or wool socks. Change your socks every day or if they become wet. ? Wear shoes that allow air to circulate, such as sandals or canvas tennis shoes.  Wash and dry your feet after exercising. Pay attention to the area between your toes. Contact a health care provider if:  You have a fever.  You have swelling, soreness, warmth, or redness in your foot.  You are not getting better with treatment.  Your symptoms get worse.  You have new symptoms. This information is not intended to replace advice given to you by your health care provider. Make sure you discuss any questions you have with your health care provider. Document Released: 07/29/2000 Document Revised: 01/07/2016 Document Reviewed: 02/02/2015 Elsevier Interactive Patient Education  2018 Elsevier Inc.  

## 2017-04-05 ENCOUNTER — Telehealth: Payer: Self-pay | Admitting: *Deleted

## 2017-04-05 MED ORDER — OXICONAZOLE NITRATE 1 % EX CREA: TOPICAL_CREAM | CUTANEOUS | 1 refills | Status: DC

## 2017-04-05 NOTE — Telephone Encounter (Signed)
[facility] [place] naftifine (NAFTIN) 1 % cream is too expensive.  Patient requests an alternative.

## 2017-04-05 NOTE — Telephone Encounter (Signed)
Let's try Oxiconazole cream 1%-apply once daily for 1 month  15 grams with one refill

## 2017-04-05 NOTE — Telephone Encounter (Signed)
New rx sent to pharmacy

## 2017-04-27 ENCOUNTER — Encounter: Payer: Self-pay | Admitting: Family Medicine

## 2017-04-27 ENCOUNTER — Ambulatory Visit (INDEPENDENT_AMBULATORY_CARE_PROVIDER_SITE_OTHER): Payer: BC Managed Care – PPO | Admitting: Family Medicine

## 2017-04-27 VITALS — BP 130/88 | HR 80 | Temp 97.7°F | Wt 179.0 lb

## 2017-04-27 DIAGNOSIS — R239 Unspecified skin changes: Secondary | ICD-10-CM

## 2017-04-27 DIAGNOSIS — K1370 Unspecified lesions of oral mucosa: Secondary | ICD-10-CM

## 2017-04-27 DIAGNOSIS — Z708 Other sex counseling: Secondary | ICD-10-CM | POA: Diagnosis not present

## 2017-04-27 NOTE — Patient Instructions (Signed)
Remember not to keep touching the areas of skin you are concerned about.  Avoid scratching as this can cause things to get worse.

## 2017-04-27 NOTE — Progress Notes (Signed)
Subjective:    Patient ID: Samantha Delacruz, female    DOB: [birth year], 51 y.o.   MRN: [id number]  No chief complaint on file.   HPI Patient was seen today for acute skin concerns.  Of note pt recently divorced or in the process of divorcing. -pt endorses bumps on her butt, a bump on her L areola, and L ear. -spots present x days to wks. -they aren't really itchy -has not tried anything for them -Pt states she wanted to make sure the bumps weren't anything b/c she wants to "have a lot of sex", "I mean a lot of sex"  "I may find someone in the parking lot".  Tinea Pedis, L foot -pt states was on po med -wonders if she needs to start using a spray -denies itching  Bleeding gums -notes while brushing teeth. -denies brushing too hard -does not happen frequently.   Allergies  Allergen Reactions  . Latex Rash  . Flu Virus Vaccine Nausea And Vomiting  . Codeine Hives  . Lactose Intolerance (Gi) Diarrhea    cramping  . Mobic [Meloxicam]   . Sulfa Antibiotics Hives    ROS General: Denies fever, chills, night sweats, changes in weight, changes in appetite HEENT: Denies headaches, ear pain, changes in vision, rhinorrhea, sore throat CV: Denies CP, palpitations, SOB, orthopnea Pulm: Denies SOB, cough, wheezing GI: Denies abdominal pain, nausea, vomiting, diarrhea, constipation GU: Denies dysuria, hematuria, frequency, vaginal discharge Msk: Denies muscle cramps, joint pains Neuro: Denies weakness, numbness, tingling Skin: Denies rashes, bruising  +multiple bumps/skin concerns    Objective:    Blood pressure 130/88, pulse 80, temperature 97.7 F (36.5 C), temperature source Oral, weight 179 lb (81.2 kg), SpO2 98 %.   Gen. Pleasant, well-nourished, in no distress, normal affect HEENT: [place]/AT, face symmetric, conjunctiva clear, no scleral icterus, PERRLA, nares patent without drainage, pharynx without erythema or exudate, mouth without bleeding or lesions. Lungs: no accessory  muscle use Skin:  Warm.  Small 45mm erythematous papule on L lower buttock, non fluctuant.  L ear with pustule in Triangular fossa.  L breast areola with 3-4 mm area of healing/dried skin, no erythema.  L foot webspaces healing, no erythema or moisture present.   Assessment/Plan:  Skin complaints -Pt reassured. -Encouraged not to scratch, pick at, or attempt to pop various bumps. -Ok to use a warm compress on L buttock. -Tinea pedis stable, healing  Mouth problem-concern about bleeding gums -Pt advised to change toothbrush to a soft bristle brush -Advised to avoid brushing too hard -Hydration encouraged  -medications reviewed.  Other sex counseling Discussed safe sex practices.

## 2017-04-28 ENCOUNTER — Ambulatory Visit: Payer: Self-pay | Admitting: Family Medicine

## 2017-05-05 ENCOUNTER — Encounter: Payer: Self-pay | Admitting: Family Medicine

## 2017-05-22 ENCOUNTER — Ambulatory Visit: Payer: BC Managed Care – PPO | Admitting: Gynecology

## 2017-05-31 ENCOUNTER — Encounter: Payer: Self-pay | Admitting: Obstetrics & Gynecology

## 2017-05-31 ENCOUNTER — Ambulatory Visit (INDEPENDENT_AMBULATORY_CARE_PROVIDER_SITE_OTHER): Payer: BC Managed Care – PPO | Admitting: Obstetrics & Gynecology

## 2017-05-31 VITALS — BP 128/82

## 2017-05-31 DIAGNOSIS — N898 Other specified noninflammatory disorders of vagina: Secondary | ICD-10-CM | POA: Diagnosis not present

## 2017-05-31 DIAGNOSIS — R300 Dysuria: Secondary | ICD-10-CM | POA: Diagnosis not present

## 2017-05-31 DIAGNOSIS — R350 Frequency of micturition: Secondary | ICD-10-CM

## 2017-05-31 MED ORDER — FLUCONAZOLE 150 MG PO TABS: 150.0000 mg | ORAL_TABLET | Freq: Every day | ORAL | 1 refills | Status: AC

## 2017-05-31 MED ORDER — CIPROFLOXACIN HCL 500 MG PO TABS: 500.0000 mg | ORAL_TABLET | Freq: Two times a day (BID) | ORAL | 0 refills | Status: DC

## 2017-05-31 NOTE — Progress Notes (Signed)
    Samantha Delacruz 1966/04/14 [id number]        51 y.o.  G1P1   RP:  Yellowish vaginal discharge with dysuria, frequency and urgency  HPI:  Last cystitis 02/2017.  Pain with urination, frequency and urgency.  No back pain.  No fever.  Mild yellowish vaginal d/c, but no odor, no itching.  Past medical history,surgical history, problem list, medications, allergies, family history and social history were all reviewed and documented in the EPIC chart.  Directed ROS with pertinent positives and negatives documented in the history of present illness/assessment and plan.  Exam:  Vitals:   05/31/17 1054  BP: 128/82   General appearance:  Normal  CVAT neg bilaterally  Gyn exam:  Vulva normal, normal vaginal secretions  U/A WBC 20-40, Bacteria Moderate.  Nit. Neg.  Assessment/Plan:  51 y.o. G1P1   1. Dysuria U/A abnormal.  H/O recurrent Cystitis.  Decision to treat. - Urinalysis with Culture Reflex  2. Frequent urination Ciprofloxacin 500 mg BID x 3 days.  Fluconazole post ABTx.  Pending Urine culture.  Diagnosis, management, medications usage reviewed. - Urinalysis with Culture Reflex  3. Vaginal discharge Normal vaginal secretions on gyn exam.  Counseling on above issues >50% x 15 minutes.  Princess Bruins MD, 11:10 AM 05/31/2017

## 2017-06-02 ENCOUNTER — Telehealth: Payer: Self-pay | Admitting: *Deleted

## 2017-06-02 MED ORDER — CIPROFLOXACIN HCL 500 MG PO TABS: 500.0000 mg | ORAL_TABLET | Freq: Two times a day (BID) | ORAL | 0 refills | Status: AC

## 2017-06-02 NOTE — Telephone Encounter (Signed)
Pt called in route out of town and left her Rx for Cipro 500 mg 1 po twice daily x 3 days #6 prescribed on 05/31/17. Pt asked if I could send Rx to [facility], Rx sent.

## 2017-06-03 LAB — URINALYSIS W MICROSCOPIC + REFLEX CULTURE
BILIRUBIN URINE: NEGATIVE
GLUCOSE, UA: NEGATIVE
HGB URINE DIPSTICK: NEGATIVE
HYALINE CAST: NONE SEEN /LPF
Ketones, ur: NEGATIVE
Nitrites, Initial: NEGATIVE
PROTEIN: NEGATIVE
Specific Gravity, Urine: 1.01 (ref 1.001–1.03)
pH: 7.5 (ref 5.0–8.0)

## 2017-06-03 LAB — URINE CULTURE
MICRO NUMBER: 81164889
SPECIMEN QUALITY: ADEQUATE

## 2017-06-03 LAB — CULTURE INDICATED

## 2017-06-04 NOTE — Patient Instructions (Signed)
1. Dysuria U/A abnormal.  H/O recurrent Cystitis.  Decision to treat. - Urinalysis with Culture Reflex  2. Frequent urination Ciprofloxacin 500 mg BID x 3 days.  Fluconazole post ABTx.  Pending Urine culture.  Diagnosis, management, medications usage reviewed. - Urinalysis with Culture Reflex  3. Vaginal discharge Normal vaginal secretions on gyn exam.  Sheresa, it was a pleasure to meet you today!  I will inform you of your Urine Culture result as soon as available.   Urinary Tract Infection, Adult A urinary tract infection (UTI) is an infection of any part of the urinary tract, which includes the kidneys, ureters, bladder, and urethra. These organs make, store, and get rid of urine in the body. UTI can be a bladder infection (cystitis) or kidney infection (pyelonephritis). What are the causes? This infection may be caused by fungi, viruses, or bacteria. Bacteria are the most common cause of UTIs. This condition can also be caused by repeated incomplete emptying of the bladder during urination. What increases the risk? This condition is more likely to develop if:  You ignore your need to urinate or hold urine for long periods of time.  You do not empty your bladder completely during urination.  You wipe back to front after urinating or having a bowel movement, if you are female.  You are uncircumcised, if you are female.  You are constipated.  You have a urinary catheter that stays in place (indwelling).  You have a weak defense (immune) system.  You have a medical condition that affects your bowels, kidneys, or bladder.  You have diabetes.  You take antibiotic medicines frequently or for long periods of time, and the antibiotics no longer work well against certain types of infections (antibiotic resistance).  You take medicines that irritate your urinary tract.  You are exposed to chemicals that irritate your urinary tract.  You are female.  What are the signs or  symptoms? Symptoms of this condition include:  Fever.  Frequent urination or passing small amounts of urine frequently.  Needing to urinate urgently.  Pain or burning with urination.  Urine that smells bad or unusual.  Cloudy urine.  Pain in the lower abdomen or back.  Trouble urinating.  Blood in the urine.  Vomiting or being less hungry than normal.  Diarrhea or abdominal pain.  Vaginal discharge, if you are female.  How is this diagnosed? This condition is diagnosed with a medical history and physical exam. You will also need to provide a urine sample to test your urine. Other tests may be done, including:  Blood tests.  Sexually transmitted disease (STD) testing.  If you have had more than one UTI, a cystoscopy or imaging studies may be done to determine the cause of the infections. How is this treated? Treatment for this condition often includes a combination of two or more of the following:  Antibiotic medicine.  Other medicines to treat less common causes of UTI.  Over-the-counter medicines to treat pain.  Drinking enough water to stay hydrated.  Follow these instructions at home:  Take over-the-counter and prescription medicines only as told by your health care provider.  If you were prescribed an antibiotic, take it as told by your health care provider. Do not stop taking the antibiotic even if you start to feel better.  Avoid alcohol, caffeine, tea, and carbonated beverages. They can irritate your bladder.  Drink enough fluid to keep your urine clear or pale yellow.  Keep all follow-up visits as told by your  health care provider. This is important.  Make sure to: ? Empty your bladder often and completely. Do not hold urine for long periods of time. ? Empty your bladder before and after sex. ? Wipe from front to back after a bowel movement if you are female. Use each tissue one time when you wipe. Contact a health care provider if:  You have  back pain.  You have a fever.  You feel nauseous or vomit.  Your symptoms do not get better after 3 days.  Your symptoms go away and then return. Get help right away if:  You have severe back pain or lower abdominal pain.  You are vomiting and cannot keep down any medicines or water. This information is not intended to replace advice given to you by your health care provider. Make sure you discuss any questions you have with your health care provider. Document Released: 05/11/2005 Document Revised: 01/13/2016 Document Reviewed: 06/22/2015 Elsevier Interactive Patient Education  2017 [employer].

## 2017-07-11 ENCOUNTER — Encounter: Payer: Self-pay | Admitting: Gynecology

## 2017-07-11 ENCOUNTER — Ambulatory Visit: Payer: BC Managed Care – PPO | Admitting: Gynecology

## 2017-07-11 VITALS — BP 118/76

## 2017-07-11 DIAGNOSIS — N760 Acute vaginitis: Secondary | ICD-10-CM

## 2017-07-11 DIAGNOSIS — B9689 Other specified bacterial agents as the cause of diseases classified elsewhere: Secondary | ICD-10-CM | POA: Diagnosis not present

## 2017-07-11 LAB — WET PREP FOR TRICH, YEAST, CLUE

## 2017-07-11 MED ORDER — METRONIDAZOLE 500 MG PO TABS: 500.0000 mg | ORAL_TABLET | Freq: Two times a day (BID) | ORAL | 0 refills | Status: DC

## 2017-07-11 NOTE — Patient Instructions (Signed)
Take the Flagyl medication twice daily for 7 days.  Avoid alcohol while taking.  Follow-up if your symptoms persist, worsen or recur.

## 2017-07-11 NOTE — Addendum Note (Signed)
Addended by: Nelva Nay on: 07/11/2017 04:59 PM   Modules accepted: Orders

## 2017-07-11 NOTE — Progress Notes (Signed)
    Samantha Delacruz 06-Feb-1966 [id number]        51 y.o.  G1P1 with one week history of vaginal discharge with odor.  No itching or irritation.  No urinary symptoms such as frequency dysuria urgency low back pain fever or chills.  Her menses last week.  Seems to have started following intercourse.  Past medical history,surgical history, problem list, medications, allergies, family history and social history were all reviewed and documented in the EPIC chart.  Directed ROS with pertinent positives and negatives documented in the history of present illness/assessment and plan.  Exam: [name] assistant Vitals:   07/11/17 1617  BP: 118/76   General appearance:  Normal Abdomen soft nontender without masses guarding rebound Pelvic external BUS vagina with scant discharge.  Cervix normal.  Uterus normal size midline mobile nontender.  Adnexa without masses or tenderness.  Assessment/Plan:  51 y.o. G1P1 exam and history consistent with a low-grade bacterial vaginosis.  Wet prep unremarkable.  Treatment options reviewed.  Patient elects for Flagyl 500 mg twice daily times 7 days.  Whole avoidance reviewed.  Follow-up if symptoms persist, worsen or recur.    Anastasio Auerbach MD, 4:55 PM 07/11/2017

## 2017-07-14 ENCOUNTER — Telehealth: Payer: Self-pay | Admitting: Family Medicine

## 2017-07-14 NOTE — Telephone Encounter (Signed)
Copied from [place] [id number]. Topic: Quick Communication - See Telephone Encounter >> Jul 14, 2017  2:04 PM Clack, Laban Emperor wrote: CRM for notification. See Telephone encounter for: Pt would have to change her fluticasone from a powder form to the inhaler.  [id number] - [place], [place] - [address] [place] [address] [phone] (Phone) [phone] (Fax)    07/14/17.

## 2017-07-14 NOTE — Telephone Encounter (Signed)
Called pt not able to leave a message due to mail box being full.

## 2017-07-17 ENCOUNTER — Telehealth: Payer: Self-pay | Admitting: *Deleted

## 2017-07-17 ENCOUNTER — Other Ambulatory Visit: Payer: Self-pay

## 2017-07-17 ENCOUNTER — Ambulatory Visit: Payer: BC Managed Care – PPO | Admitting: Gynecology

## 2017-07-17 ENCOUNTER — Encounter: Payer: Self-pay | Admitting: Gynecology

## 2017-07-17 VITALS — BP 118/70

## 2017-07-17 DIAGNOSIS — N3000 Acute cystitis without hematuria: Secondary | ICD-10-CM | POA: Diagnosis not present

## 2017-07-17 MED ORDER — CIPROFLOXACIN HCL 500 MG PO TABS: 500.0000 mg | ORAL_TABLET | Freq: Two times a day (BID) | ORAL | 0 refills | Status: DC

## 2017-07-17 NOTE — Progress Notes (Signed)
    ATTALLAH ONTKO [birth year] [id number]        51 y.o.  G1P1 presents with 3 days of worsening low back pain, bilaterally.  Started with aching now hurts almost continuously.  Wonders whether she has a kidney infection.  No real frequency dysuria urgency fever or chills.  No vaginal symptoms such as discharge or odor.  Recently treated for bacterial vaginosis with Flagyl and notes the symptoms are better.  Also started to spot a little over the last day or so with normal LMP 07/01/2017.  Past medical history,surgical history, problem list, medications, allergies, family history and social history were all reviewed and documented in the EPIC chart.  Directed ROS with pertinent positives and negatives documented in the history of present illness/assessment and plan.  Exam: [name] assistant Vitals:   07/17/17 1055  BP: 118/70   General appearance:  Normal Spine straight without CVA tenderness Abdomen soft nontender without masses guarding rebound Pelvic external BUS vagina with light spotting.  Cervix normal.  Uterus normal size midline mobile nontender.  Adnexa without masses or tenderness.  Assessment/Plan:  51 y.o. G1P1 with history as above.  Urine analysis shows many bacteria. 40-60 WBC 10-20 RBC.  No lower tract symptoms such as frequency dysuria urgency.  No fever or chills or other constitutional symptoms.  Given her low back pain though will cover for UTI with ciprofloxacin 500 mg twice daily times 7 days in the event of early atypical pyelonephritis.  Patient will follow-up if her symptoms persist, worsen or recur.    Anastasio Auerbach MD, 11:08 AM 07/17/2017

## 2017-07-17 NOTE — Telephone Encounter (Signed)
Spoke with pt stated that she want her Advair Diskus changed to one that comes in Mist form and not powder. Spoke with her pharmacy stated that they have a maintenance Inhaler  Advair HCF that is in form of a mist.

## 2017-07-17 NOTE — Telephone Encounter (Signed)
Patient has follow-up appt today w/ gyn.

## 2017-07-17 NOTE — Addendum Note (Signed)
Addended by: Nelva Nay on: 07/17/2017 11:29 AM   Modules accepted: Orders

## 2017-07-17 NOTE — Telephone Encounter (Signed)
Patient Name: Samantha Delacruz Gender: Female DOB: 13-Mar-1966 Age: 51 Y 54 M 8 D Return Phone Number: [phone] (Primary) Address: City/State/Zip: [place] Client [facility] Night - Client Client Site [facility] - Night Physician [facility] - PHYSICIAN, NOT LISTED Contact Type Call Who Is Calling Patient / Member / Family / Caregiver Call Type Triage / Clinical Relationship To Patient Self Return Phone Number [phone] (Primary) Chief Complaint ABDOMINAL PAIN - Severe and only in abdomen Reason for Call Symptomatic / Request for [place] states saw Dr on 11/27 and extreme kidney pain with bleeding. Translation No Nurse Assessment Nurse: Neil Crouch, RN, Baker Janus Date/Time Eilene Ghazi Time): 07/17/2017 4:33:44 AM Confirm and document reason for call. If symptomatic, describe symptoms. ---Caller states saw Dr on 11/27 for a different kind of infection. and extreme kidney pain with bleeding. no fever. Does the patient have any new or worsening symptoms? ---Yes Will a triage be completed? ---Yes Related visit to physician within the last 2 weeks? ---Yes Does the PT have any chronic conditions? (i.e. diabetes, asthma, etc.) ---No Is the patient pregnant or possibly pregnant? (Ask all females between the ages of 58-55) ---No Is this a behavioral health or substance abuse call? ---No Guidelines Guideline Title Affirmed Question Affirmed Notes Nurse Date/Time (Eastern Time) Urine - Blood In Side (flank) or back pain present Lenis Noon 07/17/2017 4:35:31 AM Disp. Time Eilene Ghazi Time) Disposition Final User 07/17/2017 4:32:06 AM Send to Urgent [name] 07/17/2017 4:42:43 AM See Physician within 24 Hours Yes Neil Crouch, RN, Christin Bach Disagree/Comply Comply Caller Understands Yes PLEASE NOTE: All timestamps contained within this report are represented as [place] Standard Time. CONFIDENTIALTY NOTICE:  This fax transmission is intended only for the addressee. It contains information that is legally privileged, confidential or otherwise protected from use or disclosure. If you are not the intended recipient, you are strictly prohibited from reviewing, disclosing, copying using or disseminating any of this information or taking any action in reliance on or regarding this information. If you have received this fax in error, please notify us immediately by telephone so that we can arrange for its return to [place]. Phone: [phone], Toll-Free: [phone], Fax: [phone] Page: 2 of 2 Call Id: [id number] PreDisposition Did not know what to do Care Advice Given Per Guideline SEE PHYSICIAN WITHIN 24 HOURS: BRING MEDICINES: CARE ADVICE given per Urine, Blood In (Adult) guideline. * You become worse. * Fever occurs CALL BACK IF: Referrals REFERRED TO PCP OFFICE

## 2017-07-17 NOTE — Patient Instructions (Signed)
Take the antibiotic twice daily for 7 days.  Follow-up if your symptoms persist, worsen or recur. 

## 2017-07-18 ENCOUNTER — Other Ambulatory Visit: Payer: Self-pay

## 2017-07-18 MED ORDER — FLUTICASONE-SALMETEROL 230-21 MCG/ACT IN AERO: 2.0000 | INHALATION_SPRAY | Freq: Two times a day (BID) | RESPIRATORY_TRACT | 0 refills | Status: DC

## 2017-07-18 NOTE — Telephone Encounter (Signed)
Patient has been waiting for a change in her inhaler- she needs it rather urgently. Can someone review for the change?

## 2017-07-18 NOTE — Telephone Encounter (Signed)
Rx sent to pt pharmacy, attempted to call pt but not able to leave a message.

## 2017-07-19 NOTE — Telephone Encounter (Signed)
Spoke with pt  Voiced understanding that the Rx was sent to her pharmacy.

## 2017-07-20 ENCOUNTER — Telehealth: Payer: Self-pay

## 2017-07-20 LAB — URINALYSIS W MICROSCOPIC + REFLEX CULTURE
BILIRUBIN URINE: NEGATIVE
Glucose, UA: NEGATIVE
HYALINE CAST: NONE SEEN /LPF
KETONES UR: NEGATIVE
Nitrites, Initial: NEGATIVE
PH: 6 (ref 5.0–8.0)
Protein, ur: NEGATIVE
SPECIFIC GRAVITY, URINE: 1.005 (ref 1.001–1.03)

## 2017-07-20 LAB — URINE CULTURE
MICRO NUMBER:: 81360977
SPECIMEN QUALITY: ADEQUATE

## 2017-07-20 LAB — CULTURE INDICATED

## 2017-07-20 MED ORDER — FLUCONAZOLE 150 MG PO TABS: 150.0000 mg | ORAL_TABLET | Freq: Once | ORAL | 0 refills | Status: AC

## 2017-07-20 MED ORDER — FLUCONAZOLE 150 MG PO TABS: 150.0000 mg | ORAL_TABLET | Freq: Once | ORAL | 0 refills | Status: DC

## 2017-07-20 NOTE — Telephone Encounter (Signed)
Patient informed. Rx sent 

## 2017-07-20 NOTE — Telephone Encounter (Signed)
Patient called back asking diflucan to be [facility] to [facility], Rx sent.

## 2017-07-20 NOTE — Telephone Encounter (Signed)
Okay for Diflucan 150 mg x 1 

## 2017-07-20 NOTE — Addendum Note (Signed)
Addended by: Thamas Jaegers on: 07/20/2017 01:10 PM   Modules accepted: Orders

## 2017-07-20 NOTE — Telephone Encounter (Signed)
Patient said she has been taking antibiotics (2nd course now) and is experiencing yeast infection symptoms and hoped you would send Rx for her.

## 2017-08-04 ENCOUNTER — Telehealth: Payer: Self-pay | Admitting: *Deleted

## 2017-08-04 MED ORDER — NITROFURANTOIN MONOHYD MACRO 100 MG PO CAPS
100.0000 mg | ORAL_CAPSULE | Freq: Two times a day (BID) | ORAL | 0 refills | Status: DC
Start: 2017-08-04 — End: 2017-08-17

## 2017-08-04 NOTE — Telephone Encounter (Signed)
Patient called c/o vaginal odor and burning with urination, pressure, frequency. Pt had sex on Tuesday and notice frequent urination and pressure, vaginal odor on Thursday. Pt will be leaving out town asked if Rx can be sent to pharmacy. I did explain to patient symptoms OV should be made or urgent care. Please advise

## 2017-08-04 NOTE — Telephone Encounter (Signed)
Pt informed Rx sent. 

## 2017-08-04 NOTE — Telephone Encounter (Signed)
Macrobid 100 mg twice daily times 7 days

## 2017-08-10 ENCOUNTER — Ambulatory Visit: Payer: BC Managed Care – PPO | Admitting: Gynecology

## 2017-08-10 ENCOUNTER — Telehealth: Payer: Self-pay | Admitting: *Deleted

## 2017-08-10 ENCOUNTER — Encounter: Payer: Self-pay | Admitting: Gynecology

## 2017-08-10 VITALS — BP 120/82 | Wt 169.0 lb

## 2017-08-10 DIAGNOSIS — R300 Dysuria: Secondary | ICD-10-CM | POA: Diagnosis not present

## 2017-08-10 MED ORDER — CIPROFLOXACIN HCL 500 MG PO TABS: 500.0000 mg | ORAL_TABLET | Freq: Two times a day (BID) | ORAL | 0 refills | Status: DC

## 2017-08-10 NOTE — Telephone Encounter (Signed)
Referral faxed to [facility] they will contact pt to schedule, patient aware of this as well.

## 2017-08-10 NOTE — Telephone Encounter (Signed)
-----   Message from Anastasio Auerbach, MD sent at 08/10/2017 11:08 AM EST ----- Urology referral reference recurrent/refractory UTIs

## 2017-08-10 NOTE — Progress Notes (Signed)
    Samantha Delacruz 1965/09/04 [id number]        51 y.o.  G1P1 presents with a history of recent treatment for UTI the beginning of December with ciprofloxacin 250 mg twice daily times 7 days.  Urine grew out staph sapro.  Notes that her symptoms seem to get better but then she recurred with dysuria and frequency and was started via phone on [facility] 08/04/2017.  Patient notes since that time she is continued to have dysuria, full bladder feeling, some urinary incontinence and low back discomfort.  No fever or chills nausea vomiting diarrhea constipation.  She has had several UTIs over this past year having grown out E. coli in the past.  Vaginal symptoms such as irritation, itching, discharge or odor.  Past medical history,surgical history, problem list, medications, allergies, family history and social history were all reviewed and documented in the EPIC chart.  Directed ROS with pertinent positives and negatives documented in the history of present illness/assessment and plan.  Exam: Wandra Scot assistant Vitals:   08/10/17 1023  BP: 120/82  Weight: 169 lb (76.7 kg)   General appearance:  Normal Spine straight without CVA tenderness Abdomen soft nontender without masses guarding rebound Pelvic external BUS vagina normal.  Bimanual without masses or tenderness  Assessment/Plan:  51 y.o. G1P1 with symptoms to suggest recurrence/persistence of her UTI refractile to Macrobid.  Did respond to ciprofloxacin earlier December.  Urine analysis today shows some bacteriuria although not impressive overall.  Given his strength of her symptoms will cover with ciprofloxacin 500 mg twice daily times 7 days.  Recommended urology follow-up for further evaluation due to her recurrent UTIs and will go ahead and make this arrangement for her.    Anastasio Auerbach MD, 11:04 AM 08/10/2017

## 2017-08-10 NOTE — Patient Instructions (Addendum)
Take the antibiotics twice daily for 7 days  Office will arrange for the urology appointment

## 2017-08-11 LAB — URINE CULTURE
MICRO NUMBER: 81452292
RESULT: NO GROWTH
SPECIMEN QUALITY: ADEQUATE

## 2017-08-12 LAB — URINALYSIS W MICROSCOPIC + REFLEX CULTURE
Bilirubin Urine: NEGATIVE
Glucose, UA: NEGATIVE
HYALINE CAST: NONE SEEN /LPF
Ketones, ur: NEGATIVE
Leukocyte Esterase: NEGATIVE
Nitrites, Initial: NEGATIVE
PROTEIN: NEGATIVE
SPECIFIC GRAVITY, URINE: 1.015 (ref 1.001–1.03)
pH: 7 (ref 5.0–8.0)

## 2017-08-12 LAB — NO CULTURE INDICATED

## 2017-08-17 ENCOUNTER — Ambulatory Visit: Payer: BC Managed Care – PPO | Admitting: Gynecology

## 2017-08-17 ENCOUNTER — Telehealth: Payer: Self-pay | Admitting: *Deleted

## 2017-08-17 ENCOUNTER — Encounter: Payer: Self-pay | Admitting: Gynecology

## 2017-08-17 VITALS — BP 118/76

## 2017-08-17 DIAGNOSIS — R3915 Urgency of urination: Secondary | ICD-10-CM

## 2017-08-17 DIAGNOSIS — N912 Amenorrhea, unspecified: Secondary | ICD-10-CM

## 2017-08-17 DIAGNOSIS — R140 Abdominal distension (gaseous): Secondary | ICD-10-CM | POA: Diagnosis not present

## 2017-08-17 DIAGNOSIS — Z113 Encounter for screening for infections with a predominantly sexual mode of transmission: Secondary | ICD-10-CM | POA: Diagnosis not present

## 2017-08-17 NOTE — Telephone Encounter (Signed)
Patient called to follow up was seen on 08/10/17 for UTI completed  ciprofloxacin 500 mg twice daily times 7 days, felt good for couple of days and states today she feels horrible, chills, maybe low grade fever, c/o pressure/ fullest feeling, no vaginal symptoms, has referral placed at [facility] urology, not scheduled yet. Pt coming today due to symptoms at 2pm

## 2017-08-17 NOTE — Patient Instructions (Signed)
Follow up for ultrasound as scheduled 

## 2017-08-17 NOTE — Progress Notes (Addendum)
    Samantha Delacruz 1965-10-14 [id number]        52 y.o.  G1P1 presents complaining of ongoing pelvic heaviness and generalized discomfort.  Having a lot of bloating and just not feeling well.  Also notes some urinary urgency.  Has been seen several times over the last several months for vaginal discharge/dysuria/urinary infections.  Most recently was seen 08/10/2017 and was treated with ciprofloxacin times 7 days.  Is finishing the antibiotic but does not feel any better.  Urine culture from that visit grew out no bacteria.  No overt fevers.  No significant discomfort with urination low back pain or frequency.  Some nausea but no vomiting.  No diarrhea or constipation.  Last menstrual period 06/2017 but notes partner is using vasectomy.  Past medical history,surgical history, problem list, medications, allergies, family history and social history were all reviewed and documented in the EPIC chart.  Directed ROS with pertinent positives and negatives documented in the history of present illness/assessment and plan.  Exam: Samantha Delacruz assistant Vitals:   08/17/17 1410  BP: 118/76   General appearance:  Normal Spine straight without CVA tenderness Abdomen soft nontender without masses guarding rebound Pelvic external BUS vagina normal.  Cervix normal.  Uterus normal size midline mobile nontender.  Adnexa without masses or tenderness.  Assessment/Plan:  52 y.o. G1P1 with urinary urgency with several rounds of antibiotics for UTIs last treatment did not improve her symptoms with ciprofloxacin.  Culture at that time ultimately did not grow out bacteria.  We are in the process of scheduling a urology appointment rule out interstitial cystitis and we will try to accelerate that appointment for her.  Urine analysis today is unremarkable.  Will culture for completeness.  Alternative causes for her symptoms to include GI also discussed.  I do not think overt GYN pathology.  She has skipped her December  menses using vasectomy birth control.  Will check baseline hCG now for completeness.  I also did a GC/Chlamydia screen for completeness.  Recommend GYN ultrasound to rule out nonpalpable abnormalities.  Baseline CBC and comprehensive metabolic panel also ordered.  Assuming negative ultrasound and her symptoms persist we will consider GI referral along with her urology appointment.    Samantha Auerbach MD, 2:34 PM 08/17/2017

## 2017-08-17 NOTE — Addendum Note (Signed)
Addended by: Nelva Nay on: 08/17/2017 03:04 PM   Modules accepted: Orders

## 2017-08-18 ENCOUNTER — Encounter: Payer: Self-pay | Admitting: Gynecology

## 2017-08-18 LAB — CBC WITH DIFFERENTIAL/PLATELET
Basophils Absolute: 26 cells/uL (ref 0–200)
Basophils Relative: 0.3 %
EOS ABS: 158 {cells}/uL (ref 15–500)
Eosinophils Relative: 1.8 %
HEMATOCRIT: 40.9 % (ref 35.0–45.0)
HEMOGLOBIN: 14.1 g/dL (ref 11.7–15.5)
LYMPHS ABS: 3353 {cells}/uL (ref 850–3900)
MCH: 29.4 pg (ref 27.0–33.0)
MCHC: 34.5 g/dL (ref 32.0–36.0)
MCV: 85.4 fL (ref 80.0–100.0)
MPV: 9.9 fL (ref 7.5–12.5)
Monocytes Relative: 8.8 %
NEUTROS ABS: 4488 {cells}/uL (ref 1500–7800)
Neutrophils Relative %: 51 %
Platelets: 327 10*3/uL (ref 140–400)
RBC: 4.79 10*6/uL (ref 3.80–5.10)
RDW: 12.1 % (ref 11.0–15.0)
Total Lymphocyte: 38.1 %
WBC: 8.8 10*3/uL (ref 3.8–10.8)
WBCMIX: 774 {cells}/uL (ref 200–950)

## 2017-08-18 LAB — URINALYSIS W MICROSCOPIC + REFLEX CULTURE
Bilirubin Urine: NEGATIVE
Glucose, UA: NEGATIVE
HYALINE CAST: NONE SEEN /LPF
Hgb urine dipstick: NEGATIVE
Ketones, ur: NEGATIVE
Leukocyte Esterase: NEGATIVE
Nitrites, Initial: NEGATIVE
Protein, ur: NEGATIVE
RBC / HPF: NONE SEEN /HPF (ref 0–2)
SPECIFIC GRAVITY, URINE: 1.025 (ref 1.001–1.03)
pH: 5.5 (ref 5.0–8.0)

## 2017-08-18 LAB — URINE CULTURE
MICRO NUMBER:: 90008798
Result:: NO GROWTH
SPECIMEN QUALITY:: ADEQUATE

## 2017-08-18 LAB — COMPREHENSIVE METABOLIC PANEL
AG RATIO: 2 (calc) (ref 1.0–2.5)
ALT: 12 U/L (ref 6–29)
AST: 16 U/L (ref 10–35)
Albumin: 4.5 g/dL (ref 3.6–5.1)
Alkaline phosphatase (APISO): 67 U/L (ref 33–130)
BILIRUBIN TOTAL: 0.4 mg/dL (ref 0.2–1.2)
BUN: 12 mg/dL (ref 7–25)
CALCIUM: 9.1 mg/dL (ref 8.6–10.4)
CHLORIDE: 101 mmol/L (ref 98–110)
CO2: 28 mmol/L (ref 20–32)
Creat: 0.77 mg/dL (ref 0.50–1.05)
Globulin: 2.2 g/dL (calc) (ref 1.9–3.7)
Glucose, Bld: 76 mg/dL (ref 65–99)
Potassium: 4 mmol/L (ref 3.5–5.3)
Sodium: 137 mmol/L (ref 135–146)
Total Protein: 6.7 g/dL (ref 6.1–8.1)

## 2017-08-18 LAB — C. TRACHOMATIS/N. GONORRHOEAE RNA
C. trachomatis RNA, TMA: NOT DETECTED
N. gonorrhoeae RNA, TMA: NOT DETECTED

## 2017-08-18 LAB — HCG, SERUM, QUALITATIVE: Preg, Serum: NEGATIVE

## 2017-08-18 LAB — NO CULTURE INDICATED

## 2017-08-23 NOTE — Telephone Encounter (Signed)
Pt scheduled on 09/15/17 @ 3:00pm with Dr.Eskridge

## 2017-09-06 ENCOUNTER — Other Ambulatory Visit: Payer: Self-pay | Admitting: Gynecology

## 2017-09-06 ENCOUNTER — Ambulatory Visit: Payer: BC Managed Care – PPO | Admitting: Gynecology

## 2017-09-06 ENCOUNTER — Encounter: Payer: Self-pay | Admitting: Gynecology

## 2017-09-06 ENCOUNTER — Ambulatory Visit (INDEPENDENT_AMBULATORY_CARE_PROVIDER_SITE_OTHER): Payer: BC Managed Care – PPO

## 2017-09-06 VITALS — BP 120/76

## 2017-09-06 DIAGNOSIS — N83202 Unspecified ovarian cyst, left side: Secondary | ICD-10-CM

## 2017-09-06 DIAGNOSIS — R140 Abdominal distension (gaseous): Secondary | ICD-10-CM

## 2017-09-06 NOTE — Patient Instructions (Signed)
Follow-up with your gastroenterologist to evaluate the abdominal bloating

## 2017-09-06 NOTE — Progress Notes (Signed)
    Samantha Delacruz 08/04/1966 [id number]        52 y.o.  G1P1 presents for ultrasound.  History of abdominal bloating and pelvic heaviness.  Had been recently treated for UTIs.  Follow-up urine was negative.  Past medical history,surgical history, problem list, medications, allergies, family history and social history were all reviewed and documented in the EPIC chart.  Directed ROS with pertinent positives and negatives documented in the history of present illness/assessment and plan.  Exam:  Vitals:   09/06/17 1530  BP: 120/76   General appearance:  Normal  Ultrasound transvaginal shows uterus normal size and echotexture.  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  Endometrial echo 2.7 mm.  Right ovary normal.  Left ovary with thick-walled cyst with irregular lumen 15 x 16 x 17 mm suggesting corpus luteal cyst.  Negative color flow.  Cul-de-sac negative.  Assessment/Plan:  52 y.o. G1P1 with abdominal bloating.  Ultrasound negative.  Reviewed the left ovarian small cyst which I think is physiologic.  I discussed the small size, lack of complexity and avascular nature all point towards a benign etiology.  No cul-de-sac fluid.  I suspect her bloating is GI in origin.  Recommended she follow-up with her gastroenterologist for further evaluation and she is going to call make an appointment.  She will let us know if she has a problem arranging this.    Anastasio Auerbach MD, 3:45 PM 09/06/2017

## 2017-09-28 ENCOUNTER — Ambulatory Visit: Payer: BC Managed Care – PPO | Admitting: Family Medicine

## 2017-09-28 ENCOUNTER — Encounter: Payer: Self-pay | Admitting: Family Medicine

## 2017-09-28 VITALS — BP 140/92 | HR 79 | Temp 98.2°F | Wt 169.2 lb

## 2017-09-28 DIAGNOSIS — J069 Acute upper respiratory infection, unspecified: Secondary | ICD-10-CM

## 2017-09-28 DIAGNOSIS — R062 Wheezing: Secondary | ICD-10-CM

## 2017-09-28 DIAGNOSIS — B9789 Other viral agents as the cause of diseases classified elsewhere: Secondary | ICD-10-CM

## 2017-09-28 MED ORDER — PREDNISONE 10 MG PO TABS: ORAL_TABLET | ORAL | 0 refills | Status: DC

## 2017-09-28 MED ORDER — FLUTICASONE PROPIONATE 50 MCG/ACT NA SUSP: 1.0000 | Freq: Every day | NASAL | 0 refills | Status: DC

## 2017-09-28 NOTE — Progress Notes (Signed)
Subjective:    Patient ID: Samantha Delacruz, female    DOB: 01-Nov-1965, 52 y.o.   MRN: [id number]  No chief complaint on file.   HPI Patient was seen today for acute concern.  Pt endorses cough, nasal congestion, sore throat times 1 week.  Pt denies facial pain or pressure, headache, shortness of breath.  Pt endorses using her albuterol and Advair inhalers.  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  Past Medical History:  Diagnosis Date  . Adult ADHD   . Anemia    history of  . Asthma   . Headache    Migraines  . Heart palpitations   . Hemorrhoids, internal, with bleeding and mucous discharge - Grade 1 02/05/2014  . Migraines   . Pneumonia   . Restless leg   . Rosacea   . Vertigo     Allergies  Allergen Reactions  . Latex Rash  . Flu Virus Vaccine Nausea And Vomiting  . Codeine Hives  . Lactose Intolerance (Gi) Diarrhea    cramping  . Mobic [Meloxicam]   . Sulfa Antibiotics Hives    ROS General: Denies fever, chills, night sweats, changes in weight, changes in appetite HEENT: Denies headaches, ear pain, changes in vision, rhinorrhea, sore throat CV: Denies CP, palpitations, SOB, orthopnea Pulm: Denies SOB, cough, wheezing GI: Denies abdominal pain, nausea, vomiting, diarrhea, constipation GU: Denies dysuria, hematuria, frequency, vaginal discharge Msk: Denies muscle cramps, joint pains Neuro: Denies weakness, numbness, tingling Skin: Denies rashes, bruising Psych: Denies depression, anxiety, hallucinations     Objective:    Blood pressure (!) 140/92, pulse 79, temperature 98.2 F (36.8 C), temperature source Oral, weight 169 lb 3.2 oz (76.7 kg), SpO2 98 %.   Gen. Pleasant, well-nourished, in no distress, normal affect  HEENT: [place]/AT, face symmetric,  no scleral icterus, PERRLA, nares patent with clear drainage, pharynx with mild erythema and post nasal drainage, no exudate.  TMs full bilaterally.  No cervical lymphadenopathy. Lungs:  no accessory muscle use, faint wheezing in base of L lung otherwise CTAB, no wheezes or rales Cardiovascular: RRR, no m/r/g, no peripheral edema Abdomen: BS present, soft, NT/ND Neuro:  A&Ox3, CN II-XII intact, normal gait   Wt Readings from Last 3 Encounters:  09/28/17 169 lb 3.2 oz (76.7 kg)  08/10/17 169 lb (76.7 kg)  04/27/17 179 lb (81.2 kg)    Lab Results  Component Value Date   WBC 8.8 08/17/2017   HGB 14.1 08/17/2017   HCT 40.9 08/17/2017   PLT 327 08/17/2017   GLUCOSE 76 08/17/2017   CHOL 159 06/15/2016   TRIG 57.0 06/15/2016   HDL 49.70 06/15/2016   LDLCALC 98 06/15/2016   ALT 12 08/17/2017   AST 16 08/17/2017   NA 137 08/17/2017   K 4.0 08/17/2017   CL 101 08/17/2017   CREATININE 0.77 08/17/2017   BUN 12 08/17/2017   CO2 28 08/17/2017   TSH 1.81 06/15/2016    Assessment/Plan:  Viral URI with cough -Supportive care - Plan: fluticasone (FLONASE) 50 MCG/ACT nasal spray -Given RTC precautions -recheck bp  Wheezing -Continue current medications including albuterol, Advair, and Claritin - Plan: predniSONE (DELTASONE) 10 MG tablet  Follow-up PRN  Grier Mitts, MD

## 2017-09-28 NOTE — Patient Instructions (Addendum)
Upper Respiratory Infection, Adult Most upper respiratory infections (URIs) are caused by a virus. A URI affects the nose, throat, and upper air passages. The most common type of URI is often called "the common cold." Follow these instructions at home:  Take medicines only as told by your doctor.  Gargle warm saltwater or take cough drops to comfort your throat as told by your doctor.  Use a warm mist humidifier or inhale steam from a shower to increase air moisture. This may make it easier to breathe.  Drink enough fluid to keep your pee (urine) clear or pale yellow.  Eat soups and other clear broths.  Have a healthy diet.  Rest as needed.  Go back to work when your fever is gone or your doctor says it is okay. ? You may need to stay home longer to avoid giving your URI to others. ? You can also wear a face mask and wash your hands often to prevent spread of the virus.  Use your inhaler more if you have asthma.  Do not use any tobacco products, including cigarettes, chewing tobacco, or electronic cigarettes. If you need help quitting, ask your doctor. Contact a doctor if:  You are getting worse, not better.  Your symptoms are not helped by medicine.  You have chills.  You are getting more short of breath.  You have brown or red mucus.  You have yellow or brown discharge from your nose.  You have pain in your face, especially when you bend forward.  You have a fever.  You have puffy (swollen) neck glands.  You have pain while swallowing.  You have white areas in the back of your throat. Get help right away if:  You have very bad or constant: ? Headache. ? Ear pain. ? Pain in your forehead, behind your eyes, and over your cheekbones (sinus pain). ? Chest pain.  You have long-lasting (chronic) lung disease and any of the following: ? Wheezing. ? Long-lasting cough. ? Coughing up blood. ? A change in your usual mucus.  You have a stiff neck.  You have  changes in your: ? Vision. ? Hearing. ? Thinking. ? Mood. This information is not intended to replace advice given to you by your health care provider. Make sure you discuss any questions you have with your health care provider. Document Released: 01/18/2008 Document Revised: 04/03/2016 Document Reviewed: 11/06/2013 Elsevier Interactive Patient Education  2018 Elsevier Inc.  

## 2017-10-25 ENCOUNTER — Other Ambulatory Visit: Payer: Self-pay | Admitting: Family Medicine

## 2017-10-25 DIAGNOSIS — J069 Acute upper respiratory infection, unspecified: Secondary | ICD-10-CM

## 2017-10-25 DIAGNOSIS — B9789 Other viral agents as the cause of diseases classified elsewhere: Principal | ICD-10-CM

## 2017-11-06 ENCOUNTER — Encounter: Payer: Self-pay | Admitting: Internal Medicine

## 2017-11-06 ENCOUNTER — Ambulatory Visit: Payer: BC Managed Care – PPO | Admitting: Internal Medicine

## 2017-11-06 VITALS — BP 120/72 | HR 78 | Ht 65.0 in | Wt 167.5 lb

## 2017-11-06 DIAGNOSIS — K529 Noninfective gastroenteritis and colitis, unspecified: Secondary | ICD-10-CM

## 2017-11-06 NOTE — Progress Notes (Signed)
Samantha Delacruz 52 y.o. 03-06-1966 [id number]  Assessment & Plan:   Encounter Diagnosis  Name Primary?  . Gastroenteritis presumed infectious Yes   I believe she had a gastroenteritis that it resolved.  She may have had some IBS after the fact.  She is better now and will see me as needed.  She might have an oral contact sensitivity to strawberries.  I do not think she needs any allergy testing or this is a true food allergy.  If she rechallenged herself and has persistent symptoms I think she should just avoid strawberries.  CC: Dorena Cookey, MD   Subjective:   Chief Complaint:  HPI The patient had problems in December for a few weeks where she had a lot of periumbilical pain, loose stools diarrhea and some nausea.  It lasted a few weeks.  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  She did not have any bleeding.  She did have thin stools for a while but everything is back to normal now.  I should say though that she seems to think that she may have some sort of contact sensitivity to eating strawberries because she has had some mouth soreness or a sore throat after eating those recently. Allergies  Allergen Reactions  . Latex Rash  . Flu Virus Vaccine Nausea And Vomiting  . Codeine Hives  . Lactose Intolerance (Gi) Diarrhea    cramping  . Mobic [Meloxicam]   . Sulfa Antibiotics Hives   Current Meds  Medication Sig  . ADVAIR HFA 230-21 MCG/ACT inhaler TAKE 2 PUFFS BY MOUTH TWICE A DAY  . albuterol (PROVENTIL HFA;VENTOLIN HFA) 108 (90 Base) MCG/ACT inhaler Inhale 2 puffs into the lungs every 4 (four) hours as needed for wheezing or shortness of breath.  . fluticasone (FLONASE) 50 MCG/ACT nasal spray USE 1 SPRAY IN EACH NOSTRIL DAILY  . ibuprofen (ADVIL,MOTRIN) 200 MG tablet Take 600 mg by mouth every 6 (six) hours as needed  for moderate pain.  Marland Kitchen loratadine (CLARITIN) 10 MG tablet Take 10 mg by mouth daily.    . Multiple Vitamin (MULTIVITAMIN) tablet Take 1 tablet by mouth daily.   Past Medical History:  Diagnosis Date  . Adult ADHD   . Anemia    history of  . Asthma   . Headache    Migraines  . Heart palpitations   . Hemorrhoids, internal, with bleeding and mucous discharge - Grade 1 02/05/2014  . Migraines   . Pneumonia   . Restless leg   . Rosacea   . Vertigo    Past Surgical History:  Procedure Laterality Date  . CESAREAN SECTION  08/15/92   son  . DILATATION & CURETTAGE/HYSTEROSCOPY WITH MYOSURE N/A 04/19/2016   Procedure: DILATATION & CURETTAGE/HYSTEROSCOPY WITH MYOSURE;  Surgeon: Anastasio Auerbach, MD;  Location: [facility];  Service: Gynecology;  Laterality: N/A;  request 7:30am start time   Requests one hour OR time.    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   Review of Systems As per HPI  Objective:   Physical Exam BP 120/72   Pulse 78   Ht 5\' 5"  (1.651 m)   Wt 167 lb 8 oz (76 kg)   BMI 27.87 kg/m  NAD  15 minutes time spent with patient > half in counseling coordination of care

## 2017-11-06 NOTE — Patient Instructions (Signed)
  Glad your better.     Follow up with Dr. Gessner as needed.     I appreciate the opportunity to care for you. Carl Gessner, MD, FACG   

## 2017-11-24 ENCOUNTER — Encounter: Payer: Self-pay | Admitting: Family Medicine

## 2017-11-24 ENCOUNTER — Ambulatory Visit: Payer: BC Managed Care – PPO | Admitting: Family Medicine

## 2017-11-24 ENCOUNTER — Ambulatory Visit: Payer: Self-pay

## 2017-11-24 VITALS — BP 102/68 | HR 60 | Temp 98.0°F | Ht 65.0 in | Wt 164.2 lb

## 2017-11-24 DIAGNOSIS — R42 Dizziness and giddiness: Secondary | ICD-10-CM | POA: Diagnosis not present

## 2017-11-24 NOTE — Telephone Encounter (Signed)
C/o onset intermittent dizziness/ lightheaded feeling x 2 days.  Described her Reported hx of Vertigo.  Stated she laid on a hammock 2 days ago, and this seemed to trigger the dizziness.  Also, reported she swims two days/ week, and could have water in inner ear.  Denies feeling any pain or pressure in a specific ear.  Stated her symptoms are moderate.  Requesting appt. Today.   Appt. given with PCP office at 1:15 PM.  Enc. pt. to have someone drive her to the appt.  Verb. Understanding.   Reason for Disposition . [1] MODERATE dizziness (e.g., vertigo; feels very unsteady, interferes with normal activities) AND [2] has NOT been evaluated by physician for this  Answer Assessment - Initial Assessment Questions 1. DESCRIPTION: "Describe your dizziness."     Feels like head is swimming; ringing in her ears 2. VERTIGO: "Do you feel like either you or the room is spinning or tilting?"      Feels like room is spinning 3. LIGHTHEADED: "Do you feel lightheaded?" (e.g., somewhat faint, woozy, weak upon standing)     Very lightheaded 4. SEVERITY: "How bad is it?"  "Can you walk?"   - MILD - Feels unsteady but walking normally.   - MODERATE - Feels very unsteady when walking, but not falling; interferes with normal activities (e.g., school, work) .   - SEVERE - Unable to walk without falling (requires assistance).     Moderate 5. ONSET:  "When did the dizziness begin?"     2 days ago after laying on hammock 6. AGGRAVATING FACTORS: "Does anything make it worse?" (e.g., standing, change in head position)     Any rapid change in head position 7. CAUSE: "What do you think is causing the dizziness?"     Vertigo; is a swimmer and stated she could have water in her ear 8. RECURRENT SYMPTOM: "Have you had dizziness before?" If so, ask: "When was the last time?" "What happened that time?"    Hx of vertigo; had fluid in ear and went to ENT  9. OTHER SYMPTOMS: "Do you have any other symptoms?" (e.g., headache,  weakness, numbness, vomiting, earache)     Denied any sinus issues or head congestion 10. PREGNANCY: "Is there any chance you are pregnant?" "When was your last menstrual period?"       No; LMP 6 mo. Ago  Protocols used: DIZZINESS - VERTIGO-A-AH

## 2017-11-24 NOTE — Progress Notes (Signed)
   Subjective:    Patient ID: Samantha Delacruz, female    DOB: 1966/05/09, 52 y.o.   MRN: [id number]  HPI Here for 2 days of dizziness. No headache or URI symptoms. She has a hx of vertigo and she thinks this is another bout. Meclizine does not work for her. She has tried Claritin but this is too sedating.    Review of Systems  Constitutional: Negative.   HENT: Positive for congestion, postnasal drip and sinus pressure. Negative for ear pain, sinus pain and sore throat.   Eyes: Negative.   Respiratory: Negative.   Cardiovascular: Negative.   Neurological: Positive for dizziness. Negative for headaches.       Objective:   Physical Exam  Constitutional: She is oriented to person, place, and time. She appears well-developed and well-nourished. No distress.  HENT:  Head: Normocephalic and atraumatic.  Right Ear: External ear normal.  Left Ear: External ear normal.  Nose: Nose normal.  Mouth/Throat: Oropharynx is clear and moist.  Eyes: Pupils are equal, round, and reactive to light. Conjunctivae and EOM are normal.  Neck: No thyromegaly present.  Cardiovascular: Normal rate, regular rhythm, normal heart sounds and intact distal pulses.  Pulmonary/Chest: Effort normal and breath sounds normal. No respiratory distress. She has no wheezes.  Lymphadenopathy:    She has no cervical adenopathy.  Neurological: She is alert and oriented to person, place, and time.          Assessment & Plan:  Vertigo, try Xyzal 5 mg daily and Flonase sprays daily.  Alysia Penna, MD

## 2018-01-10 ENCOUNTER — Other Ambulatory Visit: Payer: Self-pay | Admitting: Gynecology

## 2018-01-10 DIAGNOSIS — Z1231 Encounter for screening mammogram for malignant neoplasm of breast: Secondary | ICD-10-CM

## 2018-01-19 ENCOUNTER — Ambulatory Visit: Payer: BC Managed Care – PPO | Admitting: Family Medicine

## 2018-01-19 ENCOUNTER — Encounter: Payer: Self-pay | Admitting: Family Medicine

## 2018-01-19 VITALS — BP 110/80 | HR 82 | Temp 98.4°F | Wt 160.0 lb

## 2018-01-19 DIAGNOSIS — J0100 Acute maxillary sinusitis, unspecified: Secondary | ICD-10-CM | POA: Diagnosis not present

## 2018-01-19 DIAGNOSIS — H6981 Other specified disorders of Eustachian tube, right ear: Secondary | ICD-10-CM

## 2018-01-19 MED ORDER — AZITHROMYCIN 250 MG PO TABS: ORAL_TABLET | ORAL | 0 refills | Status: DC

## 2018-01-19 NOTE — Progress Notes (Signed)
Subjective:    Patient ID: Samantha Delacruz, female    DOB: Aug 16, 1965, 52 y.o.   MRN: [id number]  No chief complaint on file.   HPI Patient was seen today for acute concern.  Pt endorses right ear fullness, right jaw pain, right neck pain, coughing, sneezing   Since "the last time I was seen".  Pt has tried over-the-counter allergy med and albuterol.  Pt denies fever, chills, nausea, vomiting, headache.  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  Of note pt has an Rx for Flonase, but has not used it.  Pt also notes the over-the-counter allergy med makes her extremely sleepy so she does not take it.  Pt cannot recall the name of the med.  Past Medical History:  Diagnosis Date  . Adult ADHD   . Anemia    history of  . Asthma   . Headache    Migraines  . Heart palpitations   . Hemorrhoids, internal, with bleeding and mucous discharge - Grade 1 02/05/2014  . Migraines   . Pneumonia   . Restless leg   . Rosacea   . Vertigo     Allergies  Allergen Reactions  . Latex Rash  . Flu Virus Vaccine Nausea And Vomiting  . Codeine Hives  . Lactose Intolerance (Gi) Diarrhea    cramping  . Mobic [Meloxicam]   . Sulfa Antibiotics Hives    ROS General: Denies fever, chills, night sweats, changes in weight, changes in appetite HEENT: Denies headaches, ear pain, changes in vision, rhinorrhea, sore throat  + right ear fullness, right jaw pain, right neck pain, sneezing CV: Denies CP, palpitations, SOB, orthopnea Pulm: Denies SOB, wheezing  + cough GI: Denies abdominal pain, nausea, vomiting, diarrhea, constipation GU: Denies dysuria, hematuria, frequency, vaginal discharge Msk: Denies muscle cramps, joint pains Neuro: Denies weakness, numbness, tingling Skin: Denies rashes, bruising Psych: Denies depression, anxiety, hallucinations     Objective:    Blood pressure 110/80, pulse 82, temperature 98.4 F (36.9 C), temperature source Oral, weight 160 lb (72.6 kg), SpO2  98 %.   Gen. Pleasant, well-nourished, in no distress, normal affect   HEENT: [place]/AT, face symmetric, no scleral icterus, PERRLA, nares patent without drainage, pharynx without erythema or exudate, mild postnasal drainage.  No cervical lymphadenopathy. Lungs: no accessory muscle use, CTAB, no wheezes or rales Cardiovascular: RRR, no m/r/g, no peripheral edema Abdomen: BS present, soft, NT/ND  Wt Readings from Last 3 Encounters:  11/24/17 164 lb 3.2 oz (74.5 kg)  11/06/17 167 lb 8 oz (76 kg)  09/28/17 169 lb 3.2 oz (76.7 kg)    Lab Results  Component Value Date   WBC 8.8 08/17/2017   HGB 14.1 08/17/2017   HCT 40.9 08/17/2017   PLT 327 08/17/2017   GLUCOSE 76 08/17/2017   CHOL 159 06/15/2016   TRIG 57.0 06/15/2016   HDL 49.70 06/15/2016   LDLCALC 98 06/15/2016   ALT 12 08/17/2017   AST 16 08/17/2017   NA 137 08/17/2017   K 4.0 08/17/2017   CL 101 08/17/2017   CREATININE 0.77 08/17/2017   BUN 12 08/17/2017   CO2 28 08/17/2017   TSH 1.81 06/15/2016    Assessment/Plan:  Dysfunction of right eustachian tube -Patient advised to try Flonase. -Patient also advised to find out which allergy medicine is making her so sleepy and try a different allergy medication. -Given handout  Acute non-recurrent maxillary sinusitis  -Given a wait-and-see prescription as she is going out of  town next week. -Advised to try Flonase and new allergy medication, if symptoms continue then start azithromycin. -Plan: azithromycin (ZITHROMAX) 250 MG tablet  Patient advised to follow-up in the next few months with PCP she expresses need for referral to neurology as she thinks she has narcolepsy.  Grier Mitts, MD

## 2018-01-19 NOTE — Patient Instructions (Signed)
Eustachian Tube Dysfunction The eustachian tube connects the middle ear to the back of the nose. It regulates air pressure in the middle ear by allowing air to move between the ear and nose. It also helps to drain fluid from the middle ear space. When the eustachian tube does not function properly, air pressure, fluid, or both can build up in the middle ear. Eustachian tube dysfunction can affect one or both ears. What are the causes? This condition happens when the eustachian tube becomes blocked or cannot open normally. This may result from:  Ear infections.  Colds and other upper respiratory infections.  Allergies.  Irritation, such as from cigarette smoke or acid from the stomach coming up into the esophagus (gastroesophageal reflux).  Sudden changes in air pressure, such as from descending in an airplane.  Abnormal growths in the nose or throat, such as nasal polyps, tumors, or enlarged tissue at the back of the throat (adenoids).  What increases the risk? This condition may be more likely to develop in people who smoke and people who are overweight. Eustachian tube dysfunction may also be more likely to develop in children, especially children who have:  Certain birth defects of the mouth, such as cleft palate.  Large tonsils and adenoids.  What are the signs or symptoms? Symptoms of this condition may include:  A feeling of fullness in the ear.  Ear pain.  Clicking or popping noises in the ear.  Ringing in the ear.  Hearing loss.  Loss of balance.  Symptoms may get worse when the air pressure around you changes, such as when you travel to an area of high elevation or fly on an airplane. How is this diagnosed? This condition may be diagnosed based on:  Your symptoms.  A physical exam of your ear, nose, and throat.  Tests, such as those that measure: ? The movement of your eardrum (tympanogram). ? Your hearing (audiometry).  How is this treated? Treatment  depends on the cause and severity of your condition. If your symptoms are mild, you may be able to relieve your symptoms by moving air into ("popping") your ears. If you have symptoms of fluid in your ears, treatment may include:  Decongestants.  Antihistamines.  Nasal sprays or ear drops that contain medicines that reduce swelling (steroids).  In some cases, you may need to have a procedure to drain the fluid in your eardrum (myringotomy). In this procedure, a small tube is placed in the eardrum to:  Drain the fluid.  Restore the air in the middle ear space.  Follow these instructions at home:  Take over-the-counter and prescription medicines only as told by your health care provider.  Use techniques to help pop your ears as recommended by your health care provider. These may include: ? Chewing gum. ? Yawning. ? Frequent, forceful swallowing. ? Closing your mouth, holding your nose closed, and gently blowing as if you are trying to blow air out of your nose.  Do not do any of the following until your health care provider approves: ? Travel to high altitudes. ? Fly in airplanes. ? Work in a pressurized cabin or room. ? Scuba dive.  Keep your ears dry. Dry your ears completely after showering or bathing.  Do not smoke.  Keep all follow-up visits as told by your health care provider. This is important. Contact a health care provider if:  Your symptoms do not go away after treatment.  Your symptoms come back after treatment.  You are   unable to pop your ears.  You have: ? A fever. ? Pain in your ear. ? Pain in your head or neck. ? Fluid draining from your ear.  Your hearing suddenly changes.  You become very dizzy.  You lose your balance. This information is not intended to replace advice given to you by your health care provider. Make sure you discuss any questions you have with your health care provider. Document Released: 08/28/2015 Document Revised: 01/07/2016  Document Reviewed: 08/20/2014 Elsevier Interactive Patient Education  2018 Else[employer] Inc[employer] Sinusitis, Adult Sinusitis is soreness and inflammation of your sinuses. Sinuses are hollow spaces in the bones around your face. They are located:  Around your eyes.  In the middle of your forehead.  Behind your nose.  In your cheekbones.  Your sinuses and nasal passages are lined with a stringy fluid (mucus). Mucus normally drains out of your sinuses. When your nasal tissues get inflamed or swollen, the mucus can get trapped or blocked so air cannot flow through your sinuses. This lets bacteria, viruses, and funguses grow, and that leads to infection. Follow these instructions at home: Medicines  Take, use, or apply over-the-counter and prescription medicines only as told by your doctor. These may include nasal sprays.  If you were prescribed an antibiotic medicine, take it as told by your doctor. Do not stop taking the antibiotic even if you start to feel better. Hydrate and Humidify  Drink enough water to keep your pee (urine) clear or pale yellow.  Use a cool mist humidifier to keep the humidity level in your home above 50%.  Breathe in steam for 10-15 minutes, 3-4 times a day or as told by your doctor. You can do this in the bathroom while a hot shower is running.  Try not to spend time in cool or dry air. Rest  Rest as much as possible.  Sleep with your head raised (elevated).  Make sure to get enough sleep each night. General instructions  Put a warm, moist washcloth on your face 3-4 times a day or as told by your doctor. This will help with discomfort.  Wash your hands often with soap and water. If there is no soap and water, use hand sanitizer.  Do not smoke. Avoid being around people who are smoking (secondhand smoke).  Keep all follow-up visits as told by your doctor. This is important. Contact a doctor if:  You have a fever.  Your symptoms get worse.  Your symptoms  do not get better within 10 days. Get help right away if:  You have a very bad headache.  You cannot stop throwing up (vomiting).  You have pain or swelling around your face or eyes.  You have trouble seeing.  You feel confused.  Your neck is stiff.  You have trouble breathing. This information is not intended to replace advice given to you by your health care provider. Make sure you discuss any questions you have with your health care provider. Document Released: 01/18/2008 Document Revised: 03/27/2016 Document Reviewed: 05/27/2015 Elsevier Interactive Patient Education  2018 [employer] Inc.  

## 2018-02-16 ENCOUNTER — Ambulatory Visit
Admission: RE | Admit: 2018-02-16 | Discharge: 2018-02-16 | Disposition: A | Discharge status: Home / Self Care | Payer: BC Managed Care – PPO | Source: Ambulatory Visit | Attending: Gynecology | Admitting: Gynecology

## 2018-02-16 DIAGNOSIS — Z1231 Encounter for screening mammogram for malignant neoplasm of breast: Secondary | ICD-10-CM

## 2018-03-04 ENCOUNTER — Ambulatory Visit (HOSPITAL_COMMUNITY)
Admission: EM | Admit: 2018-03-04 | Discharge: 2018-03-04 | Disposition: A | Discharge status: Home / Self Care | Payer: BC Managed Care – PPO | Attending: Family Medicine | Admitting: Family Medicine

## 2018-03-04 ENCOUNTER — Encounter (HOSPITAL_COMMUNITY): Payer: Self-pay

## 2018-03-04 DIAGNOSIS — B029 Zoster without complications: Secondary | ICD-10-CM | POA: Diagnosis not present

## 2018-03-04 MED ORDER — GABAPENTIN 300 MG PO CAPS: 300.0000 mg | ORAL_CAPSULE | Freq: Three times a day (TID) | ORAL | 0 refills | Status: DC

## 2018-03-04 MED ORDER — VALACYCLOVIR HCL 1 G PO TABS: 1000.0000 mg | ORAL_TABLET | Freq: Three times a day (TID) | ORAL | 0 refills | Status: DC

## 2018-03-04 NOTE — ED Provider Notes (Signed)
[place]    CSN: [id number] Arrival date & time: 03/04/18  [place]     History   Chief Complaint Chief Complaint  Patient presents with  . Rash    HPI Samantha Delacruz is a 52 y.o. female.   HPI  Breaking out in a painful rash behind her left ear, progressively more redness and pain over the last 5 to 6 days.  It is gone from behind the ear back into the scalp near the occiput and down the left jawline.  No problem with hearing.  No fever chills.  No nausea vomiting.  She has not had shingles shots.  She is otherwise healthy.  Admits that she is under stress going through her divorce recently.  Just divorced 2 weeks ago.  Past Medical History:  Diagnosis Date  . Adult ADHD   . Anemia    history of  . Asthma   . Headache    Migraines  . Heart palpitations   . Hemorrhoids, internal, with bleeding and mucous discharge - Grade 1 02/05/2014  . Migraines   . Pneumonia   . Restless leg   . Rosacea   . Vertigo     Patient Active Problem List   Diagnosis Date Noted  . Right hip pain 08/21/2014  . Palpitations 08/04/2014  . 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 Rectal bleeding 12/10/2013  . Allergic rhinitis 09/26/2013  . Benign paroxysmal positional vertigo 06/20/2013  . Seborrheic keratosis, inflamed 05/23/2013  . Fibrocystic breast changes 10/02/2012  . Overweight 10/02/2012    Past Surgical History:  Procedure Laterality Date  . CESAREAN SECTION  08/15/92   son  . DILATATION & CURETTAGE/HYSTEROSCOPY WITH MYOSURE N/A 04/19/2016   Procedure: DILATATION & CURETTAGE/HYSTEROSCOPY WITH MYOSURE;  Surgeon: Anastasio Auerbach, MD;  Location: [facility];  Service: Gynecology;  Laterality: N/A;  request 7:30am start time   Requests one hour OR time.     OB History    Gravida  1   Para  1   Term      Preterm      AB      Living  1     SAB      TAB      Ectopic      Multiple      Live Births               Home Medications    Prior to Admission  medications   Medication Sig Start Date End Date Taking? Authorizing Provider  ADVAIR HFA 230-21 MCG/ACT inhaler TAKE 2 PUFFS BY MOUTH TWICE A DAY 10/25/17   Dorena Cookey, MD  albuterol (PROVENTIL HFA;VENTOLIN HFA) 108 (90 Base) MCG/ACT inhaler Inhale 2 puffs into the lungs every 4 (four) hours as needed for wheezing or shortness of breath. 06/20/16   Dorena Cookey, MD  fluticasone Asencion Islam) 50 MCG/ACT nasal spray USE 1 SPRAY IN [facility] NOSTRIL DAILY 10/25/17   Billie Ruddy, MD  gabapentin (NEURONTIN) 300 MG capsule Take 1 capsule (300 mg total) by mouth 3 (three) times daily. As needed nerve pain 03/04/18   Raylene Everts, MD  ibuprofen (ADVIL,MOTRIN) 200 MG tablet Take 600 mg by mouth every 6 (six) hours as needed for moderate pain.    [provider]  Multiple Vitamin (MULTIVITAMIN) tablet Take 1 tablet by mouth daily.    [provider]  valACYclovir (VALTREX) 1000 MG tablet Take 1 tablet (1,000 mg total) by mouth 3 (three) times daily.  03/04/18   Raylene Everts, MD    Family History Family History  Problem Relation Age of Onset  . Depression Mother   . Diabetes Mother   . Emphysema Mother   . Stroke Mother   . Cancer Mother        uterine   . Heart disease Father   . Heart failure Father   . Thyroid disease Sister   . Thyroid disease Brother   . ADD / ADHD Son   . Stroke Maternal Grandmother   . Cancer Paternal Grandfather        esophgus   . Colon cancer Neg Hx     Social History Social History   Tobacco Use  . Smoking status: Never Smoker  . Smokeless tobacco: Never Used  Substance Use Topics  . Alcohol use: Yes    Alcohol/week: 0.0 oz    Comment: occ  . Drug use: No     Allergies   Latex; Flu virus vaccine; Codeine; Lactose intolerance (gi); Mobic [meloxicam]; and Sulfa antibiotics   Review of Systems Review of Systems  Constitutional: Negative for chills and fever.  HENT: Negative for ear pain and sore throat.   Eyes: Negative  for pain and visual disturbance.  Respiratory: Negative for cough and shortness of breath.   Cardiovascular: Negative for chest pain and palpitations.  Gastrointestinal: Negative for abdominal pain and vomiting.  Genitourinary: Negative for dysuria and hematuria.  Musculoskeletal: Negative for arthralgias and back pain.  Skin: Positive for rash. Negative for color change.  Neurological: Negative for seizures and syncope.  Psychiatric/Behavioral: Negative for dysphoric mood. The patient is not nervous/anxious.   All other systems reviewed and are negative.    Physical Exam Triage Vital Signs ED Triage Vitals [03/04/18 1621]  Enc Vitals Group     BP (!) 90/54     Pulse Rate 66     Resp 18     Temp 98.5 F (36.9 C)     Temp src      SpO2 97 %     Weight      Height      Head Circumference      Peak Flow      Pain Score 6     Pain Loc      Pain Edu?      Excl. in [place]?    No data found.  Updated Vital Signs BP (!) 90/54   Pulse 66   Temp 98.5 F (36.9 C)   Resp 18   SpO2 97%   Visual Acuity Right Eye Distance:   Left Eye Distance:   Bilateral Distance:    Right Eye Near:   Left Eye Near:    Bilateral Near:     Physical Exam  Constitutional: She appears well-developed and well-nourished. No distress.  HENT:  Head: Normocephalic and atraumatic.    Mouth/Throat: Oropharynx is clear and moist.  Eyes: Pupils are equal, round, and reactive to light. Conjunctivae are normal.  Neck: Normal range of motion.  Cardiovascular: Normal rate.  Pulmonary/Chest: Effort normal. No respiratory distress.  Abdominal: Soft. She exhibits no distension.  Musculoskeletal: Normal range of motion. She exhibits no edema.  Neurological: She is alert.  Skin: Skin is warm and dry. Rash noted.  Psychiatric: She has a normal mood and affect. Her behavior is normal.     UC Treatments / Results  Labs (all labs ordered are listed, but only abnormal results are displayed) Labs Reviewed  - No data  to display  EKG None  Radiology No results found.  Procedures Procedures (including critical care time)  Medications Ordered in UC Medications - No data to display  Initial Impression / Assessment and Plan / UC Course  I have reviewed the triage vital signs and the nursing notes.  Pertinent labs & imaging results that were available during my care of the patient were reviewed by me and considered in my medical decision making (see chart for details).     Discussed shingles.  Diagnosis.  Treatment.  Immunizations. Final Clinical Impressions(s) / UC Diagnoses   Final diagnoses:  Herpes zoster without complication     Discharge Instructions     Take the valacyclovir as directed Ask your PCP about a shingles shot    ED Prescriptions    Medication Sig Dispense Auth. Provider   valACYclovir (VALTREX) 1000 MG tablet Take 1 tablet (1,000 mg total) by mouth 3 (three) times daily. 21 tablet Raylene Everts, MD   gabapentin (NEURONTIN) 300 MG capsule Take 1 capsule (300 mg total) by mouth 3 (three) times daily. As needed nerve pain 50 capsule Raylene Everts, MD     Controlled Substance Prescriptions [place] Controlled Substance Registry consulted? Not Applicable   Raylene Everts, MD 03/04/18 2015

## 2018-03-04 NOTE — ED Triage Notes (Signed)
Pt presents with rash to right side of face and pain. Pt reports generalized fatigue.

## 2018-03-04 NOTE — Discharge Instructions (Signed)
Take the valacyclovir as directed Ask your PCP about a shingles shot

## 2018-03-06 ENCOUNTER — Ambulatory Visit: Payer: BC Managed Care – PPO | Admitting: Internal Medicine

## 2018-03-06 ENCOUNTER — Encounter: Payer: Self-pay | Admitting: Internal Medicine

## 2018-03-06 VITALS — BP 124/76 | HR 76 | Temp 98.2°F | Wt 164.2 lb

## 2018-03-06 DIAGNOSIS — L089 Local infection of the skin and subcutaneous tissue, unspecified: Secondary | ICD-10-CM | POA: Diagnosis not present

## 2018-03-06 DIAGNOSIS — B029 Zoster without complications: Secondary | ICD-10-CM

## 2018-03-06 MED ORDER — DOXYCYCLINE HYCLATE 100 MG PO TABS: 100.0000 mg | ORAL_TABLET | Freq: Two times a day (BID) | ORAL | 0 refills | Status: DC

## 2018-03-06 NOTE — Progress Notes (Signed)
Chief Complaint  Patient presents with  . Herpes Zoster    shingles started on left ear, spready to neck and down to her shoulder over the last 1-2 weeks. Pt seen by UC and was prescribed Gabapentin and Acyclovir - has taken for 2-3 days with not much improvement and now having nerve pain on right side of face.    HPI: Samantha Delacruz 52 y.o.  sda PCP NA  j seen UC for shingles  Left  Neck face?   July 21  Seen in ed uc    After  A week of sx.   Tender  Burning pain behind left ear and then red along jaw line  So went to uc  Given vlatrex and   Gaba, gaba does help pain  No fever but continues to burn and thinks getting on right face     No neuro sx  Says has hx of non rash "shingles " when gets stressed and gets neuritis sx.   Lots of stress recently  Getting divorced on Monday .  Teaches [employer]  ROS: See pertinent positives and negatives per HPI. No fever  Weakness  swalloing issues or vision changes  Past Medical History:  Diagnosis Date  . Adult ADHD   . Anemia    history of  . Asthma   . Headache    Migraines  . Heart palpitations   . Hemorrhoids, internal, with bleeding and mucous discharge - Grade 1 02/05/2014  . Migraines   . Pneumonia   . Restless leg   . Rosacea   . Vertigo     Family History  Problem Relation Age of Onset  . Depression Mother   . Diabetes Mother   . Emphysema Mother   . Stroke Mother   . Cancer Mother        uterine   . Heart disease Father   . Heart failure Father   . Thyroid disease Sister   . Thyroid disease Brother   . ADD / ADHD Son   . Stroke Maternal Grandmother   . Cancer Paternal Grandfather        esophgus   . Colon cancer Neg Hx     Social History   Socioeconomic History  . Marital status: Legally Separated    Spouse name: Not on file  . Number of children: 1  . Years of education: Not on file  . Highest education level: Not on file  Occupational History    Employer: [employer]    Social Needs  . Financial resource strain: Not on file  . Food insecurity:    Worry: Not on file    Inability: Not on file  . Transportation needs:    Medical: Not on file    Non-medical: Not on file  Tobacco Use  . Smoking status: Never Smoker  . Smokeless tobacco: Never Used  Substance and Sexual Activity  . Alcohol use: Yes    Alcohol/week: 0.0 oz    Comment: occ  . Drug use: No  . Sexual activity: Yes    Birth control/protection: Other-see comments    Comment: Vasectomy-1st intercourse 52 yo-Fewer than 5 partners  Lifestyle  . Physical activity:    Days per week: Not on file    Minutes per session: Not on file  . Stress: Not on file  Relationships  . Social connections:    Talks on phone: Not on file    Gets together: Not on file  Attends religious service: Not on file    Active member of club or organization: Not on file    Attends meetings of clubs or organizations: Not on file    Relationship status: Not on file  Other Topics Concern  . 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 ADVAIR HFA 230-21 MCG/ACT inhaler TAKE 2 PUFFS BY MOUTH TWICE A DAY 24 Inhaler 0  . albuterol (PROVENTIL HFA;VENTOLIN HFA) 108 (90 Base) MCG/ACT inhaler Inhale 2 puffs into the lungs every 4 (four) hours as needed for wheezing or shortness of breath. 1 Inhaler 2  . fluticasone (FLONASE) 50 MCG/ACT nasal spray USE 1 SPRAY IN EACH NOSTRIL DAILY 16 g 0  . gabapentin (NEURONTIN) 300 MG capsule Take 1 capsule (300 mg total) by mouth 3 (three) times daily. As needed nerve pain 50 capsule 0  . ibuprofen (ADVIL,MOTRIN) 200 MG tablet Take 600 mg by mouth every 6 (six) hours as needed for moderate pain.    . Multiple Vitamin (MULTIVITAMIN) tablet Take 1 tablet by mouth  daily.    . valACYclovir (VALTREX) 1000 MG tablet Take 1 tablet (1,000 mg total) by mouth 3 (three) times daily. 21 tablet 0   No facility-administered medications prior to visit.      EXAM:  BP 124/76 (BP Location: Right Arm, Patient Position: Sitting, Cuff Size: Normal)   Pulse 76   Temp 98.2 F (36.8 C) (Oral)   Wt 164 lb 3.2 oz (74.5 kg)   BMI 27.32 kg/m   Body mass index is 27.32 kg/m.  GENERAL: vitals reviewed and listed above, alert, oriented, appears well hydrated and in no acute distress  Left jaw line red blotchy    Rashes with early papules jo vesicles  Yet     No rash on left x right pinna with  2 mm pustule   HEENT: atraumatic, conjunctiva  clear, no obvious abnormalities on inspection of external nose and ears OP : white spot right area no edema ( no pain)   NECK: no obvious masses on inspection palpation  Shoddy  Left  pc ac nodes  No edema  CV: HRRR, no clubbing cyanosis or  peripheral edema nl cap refill  MS: moves all extremities without noticeable focal  abnormality PSYCH: pleasant and cooperative, no obvious depression  Neuro non focal nl speech   ASSESSMENT AND PLAN:  Discussed the following assessment and plan:  Herpes zoster without complication probalbe  left jaw ear  - cont valtrex and gaba     Skin pustule r ear  - because of sx  complex add antibitic coverage and exp managmnet    Expectant management.    No other complication noted  Fraser Din feels may spread  At this time would not use pred cause of the ? Of bacterial skin   Infection right ear  -Patient advised to return or notify health care team  if symptoms worsen ,persist or new concerns arise.  Patient Instructions  Still suspicious for shingles on the left .   The pustule on the right  Seems bacterial and add antibiotic .   As planned .  The rash may last another 10 - 14 days but if spreading and not  sure then  Ask for reassessment.     Shingles Shingles, which is also known as herpes  zoster, is an infection that causes a painful skin rash and fluid-filled blisters. Shingles is not related to genital herpes, which is a sexually transmitted infection. Shingles only develops in people who:  Have had chickenpox.  Have received the chickenpox vaccine. (This is rare.)  What are the causes? Shingles is caused by varicella-zoster virus (VZV). This is the same virus that causes chickenpox. After exposure to VZV, the virus stays in the body in an inactive (dormant) state. Shingles develops if the virus reactivates. This can happen many years after the initial exposure to VZV. It is not known what causes this virus to reactivate. What increases the risk? People who have had chickenpox or received the chickenpox vaccine are at risk for shingles. Infection is more common in people who:  Are older than age 67.  Have a weakened defense (immune) system, such as those with HIV, AIDS, or cancer.  Are taking medicines that weaken the immune system, such as transplant medicines.  Are under great stress.  What are the signs or symptoms? Early symptoms of this condition include itching, tingling, and pain in an area on your skin. Pain may be described as burning, stabbing, or throbbing. A few days or weeks after symptoms start, a painful red rash appears, usually on one side of the body in a bandlike or beltlike pattern. The rash eventually turns into fluid-filled blisters that break open, scab over, and dry up in about 2-3 weeks. At any time during the infection, you may also develop:  A fever.  Chills.  A headache.  An upset stomach.  How is this diagnosed? This condition is diagnosed with a skin exam. Sometimes, skin or fluid samples are taken from the blisters before a diagnosis is made. These samples are examined under a microscope or sent to a lab for testing. How is this treated? There is no specific cure for this condition. Your health care provider will probably prescribe  medicines to help you manage pain, recover more quickly, and avoid long-term problems. Medicines may include:  Antiviral drugs.  Anti-inflammatory drugs.  Pain medicines.  If the area involved is on your face, you may be referred to a specialist, such as an eye doctor (ophthalmologist) or an ear, nose, and throat (ENT) doctor to help you avoid eye problems, chronic pain, or disability. Follow these instructions at home: Medicines  Take medicines only as directed by your health care provider.  Apply an anti-itch or numbing cream to the affected area as directed by your health care provider. Blister and Rash Care  Take a cool bath or apply cool compresses to the area of the rash or blisters as directed by your health care provider. This may help with pain and itching.  Keep your rash covered with a loose bandage (dressing). Wear loose-fitting clothing to help ease the pain of material rubbing against the rash.  Keep your rash and blisters clean with mild soap and cool water or as directed by your health care provider.  Check your rash every day for signs of infection. These include redness, swelling, and pain that lasts or increases.  Do not pick your blisters.  Do not scratch your rash. General instructions  Rest as directed by your health care provider.  Keep all follow-up visits as directed by your health care provider. This is important.  Until your blisters scab over, your infection can cause  chickenpox in people who have never had it or been vaccinated against it. To prevent this from happening, avoid contact with other people, especially: ? Babies. ? Pregnant women. ? Children who have eczema. ? Elderly people who have transplants. ? People who have chronic illnesses, such as leukemia or AIDS. Contact a health care provider if:  Your pain is not relieved with prescribed medicines.  Your pain does not get better after the rash heals.  Your rash looks infected. Signs  of infection include redness, swelling, and pain that lasts or increases. Get help right away if:  The rash is on your face or nose.  You have facial pain, pain around your eye area, or loss of feeling on one side of your face.  You have ear pain or you have ringing in your ear.  You have loss of taste.  Your condition gets worse. This information is not intended to replace advice given to you by your health care provider. Make sure you discuss any questions you have with your health care provider. Document Released: 08/01/2005 Document Revised: 03/27/2016 Document Reviewed: 06/12/2014 Elsevier Interactive Patient Education  2018 [place]. Dela Sweeny M.D.

## 2018-03-06 NOTE — Patient Instructions (Addendum)
Still suspicious for shingles on the left .   The pustule on the right  Seems bacterial and add antibiotic .   As planned .  The rash may last another 10 - 14 days but if spreading and not sure then  Ask for reassessment.     Shingles Shingles, which is also known as herpes zoster, is an infection that causes a painful skin rash and fluid-filled blisters. Shingles is not related to genital herpes, which is a sexually transmitted infection. Shingles only develops in people who:  Have had chickenpox.  Have received the chickenpox vaccine. (This is rare.)  What are the causes? Shingles is caused by varicella-zoster virus (VZV). This is the same virus that causes chickenpox. After exposure to VZV, the virus stays in the body in an inactive (dormant) state. Shingles develops if the virus reactivates. This can happen many years after the initial exposure to VZV. It is not known what causes this virus to reactivate. What increases the risk? People who have had chickenpox or received the chickenpox vaccine are at risk for shingles. Infection is more common in people who:  Are older than age 75.  Have a weakened defense (immune) system, such as those with HIV, AIDS, or cancer.  Are taking medicines that weaken the immune system, such as transplant medicines.  Are under great stress.  What are the signs or symptoms? Early symptoms of this condition include itching, tingling, and pain in an area on your skin. Pain may be described as burning, stabbing, or throbbing. A few days or weeks after symptoms start, a painful red rash appears, usually on one side of the body in a bandlike or beltlike pattern. The rash eventually turns into fluid-filled blisters that break open, scab over, and dry up in about 2-3 weeks. At any time during the infection, you may also develop:  A fever.  Chills.  A headache.  An upset stomach.  How is this diagnosed? This condition is diagnosed with a skin exam.  Sometimes, skin or fluid samples are taken from the blisters before a diagnosis is made. These samples are examined under a microscope or sent to a lab for testing. How is this treated? There is no specific cure for this condition. Your health care provider will probably prescribe medicines to help you manage pain, recover more quickly, and avoid long-term problems. Medicines may include:  Antiviral drugs.  Anti-inflammatory drugs.  Pain medicines.  If the area involved is on your face, you may be referred to a specialist, such as an eye doctor (ophthalmologist) or an ear, nose, and throat (ENT) doctor to help you avoid eye problems, chronic pain, or disability. Follow these instructions at home: Medicines  Take medicines only as directed by your health care provider.  Apply an anti-itch or numbing cream to the affected area as directed by your health care provider. Blister and Rash Care  Take a cool bath or apply cool compresses to the area of the rash or blisters as directed by your health care provider. This may help with pain and itching.  Keep your rash covered with a loose bandage (dressing). Wear loose-fitting clothing to help ease the pain of material rubbing against the rash.  Keep your rash and blisters clean with mild soap and cool water or as directed by your health care provider.  Check your rash every day for signs of infection. These include redness, swelling, and pain that lasts or increases.  Do not pick your blisters.  Do not scratch your rash. General instructions  Rest as directed by your health care provider.  Keep all follow-up visits as directed by your health care provider. This is important.  Until your blisters scab over, your infection can cause chickenpox in people who have never had it or been vaccinated against it. To prevent this from happening, avoid contact with other people, especially: ? Babies. ? Pregnant women. ? Children who have  eczema. ? Elderly people who have transplants. ? People who have chronic illnesses, such as leukemia or AIDS. Contact a health care provider if:  Your pain is not relieved with prescribed medicines.  Your pain does not get better after the rash heals.  Your rash looks infected. Signs of infection include redness, swelling, and pain that lasts or increases. Get help right away if:  The rash is on your face or nose.  You have facial pain, pain around your eye area, or loss of feeling on one side of your face.  You have ear pain or you have ringing in your ear.  You have loss of taste.  Your condition gets worse. This information is not intended to replace advice given to you by your health care provider. Make sure you discuss any questions you have with your health care provider. Document Released: 08/01/2005 Document Revised: 03/27/2016 Document Reviewed: 06/12/2014 Elsevier Interactive Patient Education  2018 [employer].

## 2018-04-20 ENCOUNTER — Encounter: Payer: Self-pay | Admitting: Adult Health

## 2018-04-20 ENCOUNTER — Ambulatory Visit: Payer: BC Managed Care – PPO | Admitting: Adult Health

## 2018-04-20 VITALS — BP 140/90 | Temp 98.3°F | Wt 160.0 lb

## 2018-04-20 DIAGNOSIS — R42 Dizziness and giddiness: Secondary | ICD-10-CM

## 2018-04-20 MED ORDER — ONDANSETRON HCL 4 MG PO TABS: 4.0000 mg | ORAL_TABLET | Freq: Three times a day (TID) | ORAL | 1 refills | Status: DC | PRN

## 2018-04-20 NOTE — Progress Notes (Signed)
Subjective:    Patient ID: Samantha Delacruz, female    DOB: Oct 07, 1965, 52 y.o.   MRN: [id number]  HPI  52 year old female who  has a past medical history of Adult ADHD, Anemia, Asthma, Headache, Heart palpitations, Hemorrhoids, internal, with bleeding and mucous discharge - Grade 1 (02/05/2014), Migraines, Pneumonia, Restless leg, Rosacea, and Vertigo. She presents with five days of dizziness. Reports that it feels like other bouts of vertigo she has had in the past. Meclizine does not work for her. Vertigo is worse in the morning. Changing positions makes her symptoms worse     Review of Systems See HPI   Past Medical History:  Diagnosis Date  . Adult ADHD   . Anemia    history of  . Asthma   . Headache    Migraines  . Heart palpitations   . Hemorrhoids, internal, with bleeding and mucous discharge - Grade 1 02/05/2014  . Migraines   . Pneumonia   . Restless leg   . Rosacea   . Vertigo     Social History   Socioeconomic History  . Marital status: Legally Separated    Spouse name: Not on file  . Number of children: 1  . Years of education: Not on file  . Highest education level: Not on file  Occupational History    Employer: [employer]  Social Needs  . Financial resource strain: Not on file  . Food insecurity:    Worry: Not on file    Inability: Not on file  . Transportation needs:    Medical: Not on file    Non-medical: Not on file  Tobacco Use  . Smoking status: Never Smoker  . Smokeless tobacco: Never Used  Substance and Sexual Activity  . Alcohol use: Yes    Alcohol/week: 0.0 standard drinks    Comment: occ  . Drug use: No  . Sexual activity: Yes    Birth control/protection: Other-see comments    Comment: Vasectomy-1st intercourse 52 yo-Fewer than 5 partners  Lifestyle  . Physical activity:    Days per week: Not on file    Minutes per session: Not on file  . Stress: Not on file  Relationships  . Social connections:    Talks on  phone: Not on file    Gets together: Not on file    Attends religious service: Not on file    Active member of club or organization: Not on file    Attends meetings of clubs or organizations: Not on file    Relationship status: Not on file  . Intimate partner violence:    Fear of current or ex partner: Not on file    Emotionally abused: Not on file    Physically abused: Not on file    Forced sexual activity: Not on file  Other Topics Concern  . 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 CESAREAN SECTION  08/15/92   son  . DILATATION & CURETTAGE/HYSTEROSCOPY WITH MYOSURE N/A 04/19/2016   Procedure: DILATATION & CURETTAGE/HYSTEROSCOPY WITH MYOSURE;  Surgeon: Anastasio Auerbach, MD;  Location: [facility];  Service: Gynecology;  Laterality: N/A;  request 7:30am start time   Requests one hour OR time.  Family History  Problem Relation Age of Onset  . Depression Mother   . Diabetes Mother   . Emphysema Mother   . Stroke Mother   . Cancer Mother        uterine   . Heart disease Father   . Heart failure Father   . Thyroid disease Sister   . Thyroid disease Brother   . ADD / ADHD Son   . Stroke Maternal Grandmother   . Cancer Paternal Grandfather        esophgus   . Colon cancer Neg Hx     Allergies  Allergen Reactions  . Latex Rash  . Flu Virus Vaccine Nausea And Vomiting  . Codeine Hives  . Lactose Intolerance (Gi) Diarrhea    cramping  . Mobic [Meloxicam]   . Sulfa Antibiotics Hives    Current Outpatient Medications on File Prior to Visit  Medication Sig Dispense Refill  . ADVAIR HFA 230-21 MCG/ACT inhaler TAKE 2 PUFFS BY MOUTH TWICE A DAY 24 Inhaler 0  . albuterol (PROVENTIL HFA;VENTOLIN HFA) 108 (90 Base) MCG/ACT inhaler Inhale 2 puffs into the lungs every 4  (four) hours as needed for wheezing or shortness of breath. 1 Inhaler 2  . doxycycline (VIBRA-TABS) 100 MG tablet Take 1 tablet (100 mg total) by mouth 2 (two) times daily. 14 tablet 0  . fluticasone (FLONASE) 50 MCG/ACT nasal spray USE 1 SPRAY IN EACH NOSTRIL DAILY 16 g 0  . gabapentin (NEURONTIN) 300 MG capsule Take 1 capsule (300 mg total) by mouth 3 (three) times daily. As needed nerve pain 50 capsule 0  . ibuprofen (ADVIL,MOTRIN) 200 MG tablet Take 600 mg by mouth every 6 (six) hours as needed for moderate pain.    . Multiple Vitamin (MULTIVITAMIN) tablet Take 1 tablet by mouth daily.    . valACYclovir (VALTREX) 1000 MG tablet Take 1 tablet (1,000 mg total) by mouth 3 (three) times daily. 21 tablet 0   No current facility-administered medications on file prior to visit.     There were no vitals taken for this visit.       Objective:   Physical Exam  Constitutional: She is oriented to person, place, and time. She appears well-developed and well-nourished. No distress.  HENT:  Head: Normocephalic and atraumatic.  Right Ear: Hearing, tympanic membrane, external ear and ear canal normal.  Left Ear: Hearing, tympanic membrane, external ear and ear canal normal.  Nose: Nose normal.  Mouth/Throat: Uvula is midline, oropharynx is clear and moist and mucous membranes are normal.  Eyes: Right eye exhibits nystagmus (horizontal ). Left eye exhibits nystagmus (horizontal ).  Cardiovascular: Normal rate, regular rhythm, normal heart sounds and intact distal pulses.  Pulmonary/Chest: Effort normal and breath sounds normal.  Neurological: She is alert and oriented to person, place, and time.  Skin: Skin is warm. She is not diaphoretic.  Psychiatric: She has a normal mood and affect. Her behavior is normal. Judgment and thought content normal.  Nursing note and vitals reviewed.     Assessment & Plan:  1. Vertigo - Will trial Zofran for vertigo symptoms.  - home PT exercises given for  vertigo - ondansetron (ZOFRAN) 4 MG tablet; Take 1 tablet (4 mg total) by mouth every 8 (eight) hours as needed for nausea or vomiting.  Dispense: 20 tablet; Refill: 1 - Follow up as needed  Dorothyann Peng, NP

## 2018-06-19 ENCOUNTER — Other Ambulatory Visit: Payer: Self-pay | Admitting: Family Medicine

## 2018-06-19 MED ORDER — ALBUTEROL SULFATE HFA 108 (90 BASE) MCG/ACT IN AERS: INHALATION_SPRAY | RESPIRATORY_TRACT | 2 refills | Status: DC

## 2018-08-17 ENCOUNTER — Other Ambulatory Visit: Payer: Self-pay | Admitting: *Deleted

## 2018-08-17 MED ORDER — FLUTICASONE-SALMETEROL 230-21 MCG/ACT IN AERO: INHALATION_SPRAY | RESPIRATORY_TRACT | 0 refills | Status: DC

## 2018-09-10 ENCOUNTER — Other Ambulatory Visit: Payer: Self-pay | Admitting: Gynecology

## 2018-09-10 DIAGNOSIS — Z1231 Encounter for screening mammogram for malignant neoplasm of breast: Secondary | ICD-10-CM

## 2018-09-18 ENCOUNTER — Ambulatory Visit: Payer: BC Managed Care – PPO | Admitting: Gynecology

## 2018-09-18 ENCOUNTER — Encounter: Payer: Self-pay | Admitting: Gynecology

## 2018-09-18 VITALS — BP 118/78

## 2018-09-18 DIAGNOSIS — R300 Dysuria: Secondary | ICD-10-CM

## 2018-09-18 DIAGNOSIS — N3000 Acute cystitis without hematuria: Secondary | ICD-10-CM

## 2018-09-18 MED ORDER — NITROFURANTOIN MONOHYD MACRO 100 MG PO CAPS: 100.0000 mg | ORAL_CAPSULE | Freq: Two times a day (BID) | ORAL | 0 refills | Status: DC

## 2018-09-18 NOTE — Progress Notes (Signed)
    Samantha Delacruz 1966-01-03 [id number]        53 y.o.  G1P1 presents with several weeks of intermittent suprapubic discomfort and bladder spasms.  Some urgency.  Noticed over the last several days some low back pain.  No fever or chills.  No nausea vomiting diarrhea constipation  Past medical history,surgical history, problem list, medications, allergies, family history and social history were all reviewed and documented in the EPIC chart.  Directed ROS with pertinent positives and negatives documented in the history of present illness/assessment and plan.  Exam: Vitals:   09/18/18 1236  BP: 118/78   General appearance:  Normal Spine straight without CVA tenderness Abdomen soft with mild suprapubic tenderness to deep palpation.  No masses guarding rebound  Assessment/Plan:  53 y.o. G1P1 with history and urine analysis consistent with UTI.  Will treat with Macrobid 100 mg twice daily x7 days.  Follow-up precautions reviewed.    Anastasio Auerbach MD, 12:52 PM 09/18/2018

## 2018-09-18 NOTE — Patient Instructions (Signed)
Take the Macrodantin antibiotic twice daily for 7 days.  Follow-up if your symptoms persist, worsen or recur.

## 2018-09-20 LAB — URINALYSIS, COMPLETE W/RFL CULTURE
Bilirubin Urine: NEGATIVE
Glucose, UA: NEGATIVE
HYALINE CAST: NONE SEEN /LPF
Ketones, ur: NEGATIVE
Nitrites, Initial: NEGATIVE
PROTEIN: NEGATIVE
Specific Gravity, Urine: 1.02 (ref 1.001–1.03)
pH: 6 (ref 5.0–8.0)

## 2018-09-20 LAB — URINE CULTURE
MICRO NUMBER: 149141
SPECIMEN QUALITY:: ADEQUATE

## 2018-09-20 LAB — CULTURE INDICATED

## 2018-09-21 ENCOUNTER — Telehealth: Payer: Self-pay | Admitting: Family Medicine

## 2018-09-21 ENCOUNTER — Telehealth: Payer: Self-pay

## 2018-09-21 MED ORDER — CIPROFLOXACIN HCL 250 MG PO TABS: 250.0000 mg | ORAL_TABLET | Freq: Two times a day (BID) | ORAL | 0 refills | Status: DC

## 2018-09-21 NOTE — Telephone Encounter (Signed)
Copied from [place] [phone] [phone]. Topic: Appointment Scheduling - Scheduling Inquiry for Clinic >> Sep 21, 2018 10:24 AM Scherrie Gerlach wrote: Reason for CRM: pt calling to make her yearly appt.  Pt thought she had already transferred to [facility].  Pt saw Cory at last visit. Pt states she really likes him. Advised she had not made her TOC appt, and he was not accepting.  However, she states she really liked him and they had good re pore. Would like to [facility] with Tommi Rumps if ok

## 2018-09-21 NOTE — Telephone Encounter (Signed)
I am sorry, I can no longer take new patients at this time

## 2018-09-21 NOTE — Telephone Encounter (Signed)
Ciprofloxacin 250 mg twice daily x5 days

## 2018-09-21 NOTE — Telephone Encounter (Signed)
Patient is still having symptoms. A little better but still symptoms are present. Said she would "just like to knock this out".

## 2018-09-21 NOTE — Telephone Encounter (Signed)
-----   Message from Anastasio Auerbach, MD sent at 09/21/2018  8:27 AM EST ----- Tell patient that her urine culture grew out bacteria that had an intermediate sensitivity to the Macrodantin antibiotic.  That means it probably will work but I wanted to check with her to make sure she is feeling better.  Assuming she feels better and her symptoms resolve then will follow.  If she has persistence of her symptoms I may need to change her antibiotics.

## 2018-09-21 NOTE — Telephone Encounter (Signed)
Spoke with patient and informed her. Rx sent. 

## 2018-09-21 NOTE — Telephone Encounter (Signed)
Samantha Delacruz, pt will need assistance establishing with a provider.  Can you assist?

## 2018-09-25 NOTE — Telephone Encounter (Signed)
Pt scheduled for TOC with Jerilee Hoh.

## 2018-10-02 ENCOUNTER — Ambulatory Visit: Payer: BC Managed Care – PPO | Admitting: Gynecology

## 2018-10-02 ENCOUNTER — Encounter: Payer: Self-pay | Admitting: Gynecology

## 2018-10-02 VITALS — BP 124/80 | Ht 65.0 in | Wt 164.0 lb

## 2018-10-02 DIAGNOSIS — Z113 Encounter for screening for infections with a predominantly sexual mode of transmission: Secondary | ICD-10-CM | POA: Diagnosis not present

## 2018-10-02 DIAGNOSIS — Z1151 Encounter for screening for human papillomavirus (HPV): Secondary | ICD-10-CM | POA: Diagnosis not present

## 2018-10-02 DIAGNOSIS — Z01419 Encounter for gynecological examination (general) (routine) without abnormal findings: Secondary | ICD-10-CM | POA: Diagnosis not present

## 2018-10-02 NOTE — Progress Notes (Signed)
    Samantha Delacruz [birth year] [id number]        52 y.o.  G1P1 for annual gynecologic exam.  Without gynecologic complaints.  Recent treatment for UTI with resolution of her symptoms.  Requests STD screening.  No known exposure but would like to be screened.  Past medical history,surgical history, problem list, medications, allergies, family history and social history were all reviewed and documented as reviewed in the EPIC chart.  ROS:  Performed with pertinent positives and negatives included in the history, assessment and plan.   Additional significant findings : None   Exam: [name] assistant Vitals:   10/02/18 1550  BP: 124/80  Weight: 164 lb (74.4 kg)  Height: 5\' 5"  (1.651 m)   Body mass index is 27.29 kg/m.  General appearance:  Normal affect, orientation and appearance. Skin: Grossly normal HEENT: Without gross lesions.  No cervical or supraclavicular adenopathy. Thyroid normal.  Lungs:  Clear without wheezing, rales or rhonchi Cardiac: RR, without RMG Abdominal:  Soft, nontender, without masses, guarding, rebound, organomegaly or hernia Breasts:  Examined lying and sitting without masses, retractions, discharge or axillary adenopathy. Pelvic:  Ext, BUS, Vagina: Normal with atrophic changes  Cervix: Normal with atrophic changes.  GC/chlamydia, Pap smear/HPV  Uterus: Anteverted, normal size, shape and contour, midline and mobile nontender   Adnexa: Without masses or tenderness    Anus and perineum: Normal   Rectovaginal: Normal sphincter tone without palpated masses or tenderness.    Assessment/Plan:  53 y.o. G1P1 female for annual gynecologic exam.   1. Postmenopausal.  Doing well without significant menopausal symptoms or any vaginal bleeding. 2. STD screening requested.  No known exposure but had unprotected intercourse and would like to be screened.  GC/chlamydia, RPR, hepatitis B, hepatitis C, HIV and HPV with her Pap smear done.  No abnormal vaginal  discharge or other symptoms.  Exam is normal today. 3. Pap smear 2016.  Pap smear/HPV today. 4. Colonoscopy 2016.  Repeat at their recommended interval. 5. Mammography 02/2018.  Continue with annual mammography when due.  Breast exam normal today. 6. Health maintenance.  Has appointment with a new primary provider this coming week.  No blood work done today.  I did repeat her urine analysis to make sure that her recently treated UTI has cleared completely.  She does have a history of recurrent UTIs in the past.  Follow-up for STD screening results.  Follow-up in 1 year for annual exam.   Anastasio Auerbach MD, 4:12 PM 10/02/2018

## 2018-10-02 NOTE — Patient Instructions (Signed)
Follow-up in 1 year for annual exam, sooner if any issues. 

## 2018-10-03 LAB — PAP IG AND HPV HIGH-RISK: HPV DNA High Risk: NOT DETECTED

## 2018-10-03 LAB — URINALYSIS, COMPLETE W/RFL CULTURE
BACTERIA UA: NONE SEEN /HPF
Bilirubin Urine: NEGATIVE
Glucose, UA: NEGATIVE
Hgb urine dipstick: NEGATIVE
Hyaline Cast: NONE SEEN /LPF
Ketones, ur: NEGATIVE
Leukocyte Esterase: NEGATIVE
Nitrites, Initial: NEGATIVE
PROTEIN: NEGATIVE
RBC / HPF: NONE SEEN /HPF (ref 0–2)
SQUAMOUS EPITHELIAL / LPF: NONE SEEN /HPF (ref <=5)
Specific Gravity, Urine: 1.012 (ref 1.001–1.03)
WBC, UA: NONE SEEN /HPF (ref 0–5)
pH: 5.5 (ref 5.0–8.0)

## 2018-10-03 LAB — RPR: RPR Ser Ql: NONREACTIVE

## 2018-10-03 LAB — HEPATITIS B SURFACE ANTIGEN: HEP B S AG: NONREACTIVE

## 2018-10-03 LAB — HIV ANTIBODY (ROUTINE TESTING W REFLEX): HIV 1&2 Ab, 4th Generation: NONREACTIVE

## 2018-10-03 LAB — HEPATITIS C ANTIBODY
HEP C AB: NONREACTIVE
SIGNAL TO CUT-OFF: 0.01 (ref <1.00)

## 2018-10-03 LAB — NO CULTURE INDICATED

## 2018-10-03 LAB — C. TRACHOMATIS/N. GONORRHOEAE RNA
C. trachomatis RNA, TMA: NOT DETECTED
N. gonorrhoeae RNA, TMA: NOT DETECTED

## 2018-10-04 ENCOUNTER — Encounter: Payer: Self-pay | Admitting: Internal Medicine

## 2018-10-05 ENCOUNTER — Encounter: Payer: BC Managed Care – PPO | Admitting: Internal Medicine

## 2018-10-05 ENCOUNTER — Encounter: Payer: Self-pay | Admitting: Internal Medicine

## 2018-10-05 ENCOUNTER — Ambulatory Visit: Payer: BC Managed Care – PPO | Admitting: Internal Medicine

## 2018-10-05 VITALS — BP 130/90 | HR 87 | Temp 98.8°F | Ht 65.0 in | Wt 161.0 lb

## 2018-10-05 DIAGNOSIS — Z23 Encounter for immunization: Secondary | ICD-10-CM

## 2018-10-05 DIAGNOSIS — F419 Anxiety disorder, unspecified: Secondary | ICD-10-CM

## 2018-10-05 DIAGNOSIS — J4520 Mild intermittent asthma, uncomplicated: Secondary | ICD-10-CM | POA: Diagnosis not present

## 2018-10-05 DIAGNOSIS — S069X9D Unspecified intracranial injury with loss of consciousness of unspecified duration, subsequent encounter: Secondary | ICD-10-CM

## 2018-10-05 DIAGNOSIS — Z0000 Encounter for general adult medical examination without abnormal findings: Secondary | ICD-10-CM | POA: Diagnosis not present

## 2018-10-05 DIAGNOSIS — Z8619 Personal history of other infectious and parasitic diseases: Secondary | ICD-10-CM

## 2018-10-05 DIAGNOSIS — R5383 Other fatigue: Secondary | ICD-10-CM

## 2018-10-05 LAB — TSH: TSH: 1.93 u[IU]/mL (ref 0.35–4.50)

## 2018-10-05 LAB — LIPID PANEL
Cholesterol: 222 mg/dL — ABNORMAL HIGH (ref 0–200)
HDL: 70.5 mg/dL (ref >39.00)
LDL Cholesterol: 136 mg/dL — ABNORMAL HIGH (ref 0–99)
NONHDL: 151.47
Total CHOL/HDL Ratio: 3
Triglycerides: 76 mg/dL (ref 0.0–149.0)
VLDL: 15.2 mg/dL (ref 0.0–40.0)

## 2018-10-05 LAB — COMPREHENSIVE METABOLIC PANEL
ALT: 12 U/L (ref 0–35)
AST: 15 U/L (ref 0–37)
Albumin: 4.8 g/dL (ref 3.5–5.2)
Alkaline Phosphatase: 60 U/L (ref 39–117)
BUN: 11 mg/dL (ref 6–23)
CO2: 29 mEq/L (ref 19–32)
Calcium: 9.5 mg/dL (ref 8.4–10.5)
Chloride: 101 mEq/L (ref 96–112)
Creatinine, Ser: 0.68 mg/dL (ref 0.40–1.20)
GFR: 90.2 mL/min (ref >60.00)
Glucose, Bld: 80 mg/dL (ref 70–99)
Potassium: 4 mEq/L (ref 3.5–5.1)
Sodium: 139 mEq/L (ref 135–145)
TOTAL PROTEIN: 7.2 g/dL (ref 6.0–8.3)
Total Bilirubin: 0.7 mg/dL (ref 0.2–1.2)

## 2018-10-05 LAB — CBC WITH DIFFERENTIAL/PLATELET
Basophils Absolute: 0 10*3/uL (ref 0.0–0.1)
Basophils Relative: 0.5 % (ref 0.0–3.0)
Eosinophils Absolute: 0.1 10*3/uL (ref 0.0–0.7)
Eosinophils Relative: 1.7 % (ref 0.0–5.0)
HCT: 44.4 % (ref 36.0–46.0)
Hemoglobin: 15.4 g/dL — ABNORMAL HIGH (ref 12.0–15.0)
Lymphocytes Relative: 36.1 % (ref 12.0–46.0)
Lymphs Abs: 2.7 10*3/uL (ref 0.7–4.0)
MCHC: 34.8 g/dL (ref 30.0–36.0)
MCV: 86.2 fl (ref 78.0–100.0)
Monocytes Absolute: 0.6 10*3/uL (ref 0.1–1.0)
Monocytes Relative: 8.4 % (ref 3.0–12.0)
Neutro Abs: 4 10*3/uL (ref 1.4–7.7)
Neutrophils Relative %: 53.3 % (ref 43.0–77.0)
Platelets: 363 10*3/uL (ref 150.0–400.0)
RBC: 5.16 Mil/uL — AB (ref 3.87–5.11)
RDW: 12.4 % (ref 11.5–15.5)
WBC: 7.5 10*3/uL (ref 4.0–10.5)

## 2018-10-05 LAB — VITAMIN B12: Vitamin B-12: 271 pg/mL (ref 211–911)

## 2018-10-05 LAB — VITAMIN D 25 HYDROXY (VIT D DEFICIENCY, FRACTURES): VITD: 24.42 ng/mL — ABNORMAL LOW (ref 30.00–100.00)

## 2018-10-05 NOTE — Progress Notes (Signed)
Established Patient Office Visit     CC/Reason for Visit: transfer of care from Dr. Sherren Mocha, CPE  HPI: Samantha Delacruz is a 53 y.o. female who is coming in today for the above mentioned reasons. Past Medical History is significant for asthma, ADHD, TBI, vertigo and shingles.  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  Patient uses inhalers for asthma and her last flare up was about 3 months ago.  She does not use them on a regular basis.  2017 patient tripped over a picnic table, hit head on concrete and hit her head and was dx with a TBI and sts her memory is not as good as it use to be.  Patient sts that when she flys her vertigo seems to flare up.  Patient had shingles about a year ago and she had a rash on her left ear that spread to her cheekbone.  She denies receving the Shingrix vaccine.  Patient sees Dr. Phineas Real for GYN and was seen yesterday.  She had a pap and breast exam; sts her last mammogram was July of last year.  Patient had a colonoscopy at age 53 and is not due for another ten years.  Patient has not had a flu vaccine due to being "allergic" because it makes her vomit.  Last eye exam was 04-2018 and last dental visit was about 6 months ago.  Patient would like a referral to an allergy clinic and would like a referral to Neuro because she never got a follow up after her TBI.      Past Medical/Surgical History: Past Medical History:  Diagnosis Date  . Adult ADHD   . Anemia    history of  . Asthma   . Headache    Migraines  . Heart palpitations   . Hemorrhoids, internal, with bleeding and mucous discharge - Grade 1 02/05/2014  . Migraines   . Pneumonia   . Restless leg   . Rosacea   . Shingles   . Vertigo     Past Surgical History:  Procedure Laterality Date  . CESAREAN SECTION  08/15/92   son  . DILATATION & CURETTAGE/HYSTEROSCOPY WITH MYOSURE N/A 04/19/2016   Procedure: DILATATION & CURETTAGE/HYSTEROSCOPY WITH MYOSURE;  Surgeon: Anastasio Auerbach, MD;  Location: [facility];  Service: Gynecology;  Laterality: N/A;  request 7:30am start time   Requests one hour OR time.     Social History:  reports that she has never smoked. She has never used smokeless tobacco. She reports current alcohol use. She reports that she does not use drugs.  Allergies: Allergies  Allergen Reactions  . Latex Rash  . Flu Virus Vaccine Nausea And Vomiting  . Codeine Hives  . Lactose Intolerance (Gi) Diarrhea    cramping  . Mobic [Meloxicam]   . Sulfa Antibiotics Hives    Family History:  Family History  Problem Relation Age of Onset  . Depression Mother   . Diabetes Mother   . Emphysema Mother   . Stroke Mother   . Cancer Mother        uterine   . Heart disease Father   . Heart failure Father   . Thyroid disease Sister   . Thyroid disease Brother   . ADD / ADHD Son   . Stroke Maternal Grandmother   . Cancer Paternal Grandfather        esophgus   . Colon cancer Neg Hx      Current  Outpatient Medications:  .  albuterol (PROAIR HFA) 108 (90 Base) MCG/ACT inhaler, INHALE 2 PUFFS EVERY 4 HOURS AS NEEDED FOR WHEEZING OR SHORTNESS OF BREATH, Disp: 8.5 Inhaler, Rfl: 2 .  fluticasone (FLONASE) 50 MCG/ACT nasal spray, USE 1 SPRAY IN EACH NOSTRIL DAILY, Disp: 16 g, Rfl: 0 .  fluticasone-salmeterol (ADVAIR HFA) 230-21 MCG/ACT inhaler, TAKE 2 PUFFS BY MOUTH TWICE A DAY, Disp: 24 Inhaler, Rfl: 0 .  Multiple Vitamin (MULTIVITAMIN) tablet, Take 1 tablet by mouth daily., Disp: , Rfl:   Review of Systems:  Constitutional: Denies fever, chills, diaphoresis, appetite change.  C/o fatigue HEENT: Denies photophobia, eye pain, redness, hearing loss, ear pain, congestion, sore throat, rhinorrhea, sneezing, mouth sores, trouble swallowing, neck pain, neck stiffness and tinnitus.   Respiratory: Denies SOB, DOE, cough, chest tightness,  and wheezing.   Cardiovascular: Denies chest pain, palpitations and leg swelling.  Gastrointestinal: Denies nausea,  vomiting, abdominal pain, diarrhea, constipation, blood in stool and abdominal distention.  Genitourinary: Denies dysuria, urgency, frequency, hematuria, flank pain and difficulty urinating.  Endocrine: Denies: hot or cold intolerance, sweats, changes in hair or nails, polyuria, polydipsia. Musculoskeletal: Denies myalgias, back pain, joint swelling, arthralgias and gait problem.  Skin: Denies pallor or wound.  C/o rash on left side shoulder Neurological: Denies dizziness, seizures, syncope, weakness, light-headedness, numbness and headaches. C/o vertigo occ. Hematological: Denies adenopathy. Easy bruising, personal or family bleeding history  Psychiatric/Behavioral: Denies suicidal ideation, mood changes, confusion, nervousness, sleep disturbance and agitation   Physical Exam: Vitals:   10/05/18 1113  BP: 130/90  Pulse: 87  Temp: 98.8 F (37.1 C)  TempSrc: Oral  SpO2: 96%  Weight: 161 lb (73 kg)  Height: _0  (1.651 m)    Body mass index is 26.79 kg/m.   Constitutional: NAD, calm, comfortable Eyes: PERRL, lids and conjunctivae normal ENMT: Mucous membranes are moist. Posterior pharynx clear of any exudate or lesions. Normal dentition. Tympanic membrane is pearly white, no erythema or bulging. Neck: normal, supple, no masses, no thyromegaly Respiratory: clear to auscultation bilaterally, no wheezing, no crackles. Normal respiratory effort. No accessory muscle use.  Cardiovascular: Regular rate and rhythm, no murmurs / rubs / gallops. No extremity edema. 2+ pedal pulses. No carotid bruits. Abdomen: no tenderness, no masses palpated. No hepatosplenomegaly. Bowel sounds positive.  Musculoskeletal: no clubbing / cyanosis. No joint deformity upper and lower extremities. Good ROM, no contractures. Normal muscle tone.  Skin: no lesions, ulcers. No induration 3 small superficial scabs from pt picking at it last night  Neurologic: CN 2-12 grossly intact. Strength 5/5 in all 4.    Psychiatric: Normal judgment and insight. Alert and oriented x 3. Seems mildly anxious   Impression and Plan:  Mild intermittent asthma, unspecified whether complicated -  -Well controlled. Uses inhalers PRN only.  Anxiety -pt does not want any type of psych medication -discussed regular exercise to release stress  History of shingles -she will receive the shingrix vaccine dose 1 today  Fatigue, unspecified type -  -TSH, CBC with Differential/Platelets, CMP, Vitamin B12, VITAMIN D 25 Hydroxy (Vit-D Deficiency, Fractures)  Encounter for preventive health examination -  -Labs done -shiingles today, otherwise UTD on immunizations. -UTD on age appropriate cancer screening  Traumatic brain injury with loss of consciousness, subsequent encounter -  -Ambulatory referral to Neurology -no current symptoms other than occasional vertigo when flying.    Patient Instructions  Great meeting you today.  Instructions: -Referral to Allergy for skin testing -Referral to Neuro for head injury follow  up -will draw blood work today and notify you with the results -1 of 2 shingles vaccines today -schedule follow up in 1 year or sooner if needed   Preventive Care 40-64 Years, Female Preventive care refers to lifestyle choices and visits with your health care provider that can promote health and wellness. What does preventive care include?   A yearly physical exam. This is also called an annual well check.  Dental exams once or twice a year.  Routine eye exams. Ask your health care provider how often you should have your eyes checked.  Personal lifestyle choices, including: ? Daily care of your teeth and gums. ? Regular physical activity. ? Eating a healthy diet. ? Avoiding tobacco and drug use. ? Limiting alcohol use. ? Practicing safe sex. ? Taking low-dose aspirin daily starting at age 30. ? Taking vitamin and mineral supplements as recommended by your health care  provider. What happens during an annual well check? The services and screenings done by your health care provider during your annual well check will depend on your age, overall health, lifestyle risk factors, and family history of disease. Counseling Your health care provider may ask you questions about your:  Alcohol use.  Tobacco use.  Drug use.  Emotional well-being.  Home and relationship well-being.  Sexual activity.  Eating habits.  Work and work Statistician.  Method of birth control.  Menstrual cycle.  Pregnancy history. Screening You may have the following tests or measurements:  Height, weight, and BMI.  Blood pressure.  Lipid and cholesterol levels. These may be checked every 5 years, or more frequently if you are over 44 years old.  Skin check.  Lung cancer screening. You may have this screening every year starting at age 25 if you have a 30-pack-year history of smoking and currently smoke or have quit within the past 15 years.  Colorectal cancer screening. All adults should have this screening starting at age 71 and continuing until age 37. Your health care provider may recommend screening at age 64. You will have tests every 1-10 years, depending on your results and the type of screening test. People at increased risk should start screening at an earlier age. Screening tests may include: ? Guaiac-based fecal occult blood testing. ? Fecal immunochemical test (FIT). ? Stool DNA test. ? Virtual colonoscopy. ? Sigmoidoscopy. During this test, a flexible tube with a tiny camera (sigmoidoscope) is used to examine your rectum and lower colon. The sigmoidoscope is inserted through your anus into your rectum and lower colon. ? Colonoscopy. During this test, a long, thin, flexible tube with a tiny camera (colonoscope) is used to examine your entire colon and rectum.  Hepatitis C blood test.  Hepatitis B blood test.  Sexually transmitted disease (STD)  testing.  Diabetes screening. This is done by checking your blood sugar (glucose) after you have not eaten for a while (fasting). You may have this done every 1-3 years.  Mammogram. This may be done every 1-2 years. Talk to your health care provider about when you should start having regular mammograms. This may depend on whether you have a family history of breast cancer.  BRCA-related cancer screening. This may be done if you have a family history of breast, ovarian, tubal, or peritoneal cancers.  Pelvic exam and Pap test. This may be done every 3 years starting at age 37. Starting at age 75, this may be done every 5 years if you have a Pap test in combination with an HPV  test.  Bone density scan. This is done to screen for osteoporosis. You may have this scan if you are at high risk for osteoporosis. Discuss your test results, treatment options, and if necessary, the need for more tests with your health care provider. Vaccines Your health care provider may recommend certain vaccines, such as:  Influenza vaccine. This is recommended every year.  Tetanus, diphtheria, and acellular pertussis (Tdap, Td) vaccine. You may need a Td booster every 10 years.  Varicella vaccine. You may need this if you have not been vaccinated.  Zoster vaccine. You may need this after age 66.  Measles, mumps, and rubella (MMR) vaccine. You may need at least one dose of MMR if you were born in 1957 or later. You may also need a second dose.  Pneumococcal 13-valent conjugate (PCV13) vaccine. You may need this if you have certain conditions and were not previously vaccinated.  Pneumococcal polysaccharide (PPSV23) vaccine. You may need one or two doses if you smoke cigarettes or if you have certain conditions.  Meningococcal vaccine. You may need this if you have certain conditions.  Hepatitis A vaccine. You may need this if you have certain conditions or if you travel or work in places where you may be exposed  to hepatitis A.  Hepatitis B vaccine. You may need this if you have certain conditions or if you travel or work in places where you may be exposed to hepatitis B.  Haemophilus influenzae type b (Hib) vaccine. You may need this if you have certain conditions. Talk to your health care provider about which screenings and vaccines you need and how often you need them. This information is not intended to replace advice given to you by your health care provider. Make sure you discuss any questions you have with your health care provider. Document Released: 08/28/2015 Document Revised: 09/21/2017 Document Reviewed: 06/02/2015 Elsevier Interactive Patient Education  2019 [place], RN DNP Student [facility]

## 2018-10-05 NOTE — Patient Instructions (Addendum)
Great meeting you today.  Instructions: -Referral to Allergy for skin testing -Referral to Neuro for head injury follow up -will draw blood work today and notify you with the results -1 of 2 shingles vaccines today -schedule follow up in 1 year or sooner if needed   Preventive Care 40-64 Years, Female Preventive care refers to lifestyle choices and visits with your health care provider that can promote health and wellness. What does preventive care include?   A yearly physical exam. This is also called an annual well check.  Dental exams once or twice a year.  Routine eye exams. Ask your health care provider how often you should have your eyes checked.  Personal lifestyle choices, including: ? Daily care of your teeth and gums. ? Regular physical activity. ? Eating a healthy diet. ? Avoiding tobacco and drug use. ? Limiting alcohol use. ? Practicing safe sex. ? Taking low-dose aspirin daily starting at age 61. ? Taking vitamin and mineral supplements as recommended by your health care provider. What happens during an annual well check? The services and screenings done by your health care provider during your annual well check will depend on your age, overall health, lifestyle risk factors, and family history of disease. Counseling Your health care provider may ask you questions about your:  Alcohol use.  Tobacco use.  Drug use.  Emotional well-being.  Home and relationship well-being.  Sexual activity.  Eating habits.  Work and work Statistician.  Method of birth control.  Menstrual cycle.  Pregnancy history. Screening You may have the following tests or measurements:  Height, weight, and BMI.  Blood pressure.  Lipid and cholesterol levels. These may be checked every 5 years, or more frequently if you are over 79 years old.  Skin check.  Lung cancer screening. You may have this screening every year starting at age 70 if you have a 30-pack-year history  of smoking and currently smoke or have quit within the past 15 years.  Colorectal cancer screening. All adults should have this screening starting at age 61 and continuing until age 27. Your health care provider may recommend screening at age 60. You will have tests every 1-10 years, depending on your results and the type of screening test. People at increased risk should start screening at an earlier age. Screening tests may include: ? Guaiac-based fecal occult blood testing. ? Fecal immunochemical test (FIT). ? Stool DNA test. ? Virtual colonoscopy. ? Sigmoidoscopy. During this test, a flexible tube with a tiny camera (sigmoidoscope) is used to examine your rectum and lower colon. The sigmoidoscope is inserted through your anus into your rectum and lower colon. ? Colonoscopy. During this test, a long, thin, flexible tube with a tiny camera (colonoscope) is used to examine your entire colon and rectum.  Hepatitis C blood test.  Hepatitis B blood test.  Sexually transmitted disease (STD) testing.  Diabetes screening. This is done by checking your blood sugar (glucose) after you have not eaten for a while (fasting). You may have this done every 1-3 years.  Mammogram. This may be done every 1-2 years. Talk to your health care provider about when you should start having regular mammograms. This may depend on whether you have a family history of breast cancer.  BRCA-related cancer screening. This may be done if you have a family history of breast, ovarian, tubal, or peritoneal cancers.  Pelvic exam and Pap test. This may be done every 3 years starting at age 61. Starting at age 57,  this may be done every 5 years if you have a Pap test in combination with an HPV test.  Bone density scan. This is done to screen for osteoporosis. You may have this scan if you are at high risk for osteoporosis. Discuss your test results, treatment options, and if necessary, the need for more tests with your health  care provider. Vaccines Your health care provider may recommend certain vaccines, such as:  Influenza vaccine. This is recommended every year.  Tetanus, diphtheria, and acellular pertussis (Tdap, Td) vaccine. You may need a Td booster every 10 years.  Varicella vaccine. You may need this if you have not been vaccinated.  Zoster vaccine. You may need this after age 28.  Measles, mumps, and rubella (MMR) vaccine. You may need at least one dose of MMR if you were born in 1957 or later. You may also need a second dose.  Pneumococcal 13-valent conjugate (PCV13) vaccine. You may need this if you have certain conditions and were not previously vaccinated.  Pneumococcal polysaccharide (PPSV23) vaccine. You may need one or two doses if you smoke cigarettes or if you have certain conditions.  Meningococcal vaccine. You may need this if you have certain conditions.  Hepatitis A vaccine. You may need this if you have certain conditions or if you travel or work in places where you may be exposed to hepatitis A.  Hepatitis B vaccine. You may need this if you have certain conditions or if you travel or work in places where you may be exposed to hepatitis B.  Haemophilus influenzae type b (Hib) vaccine. You may need this if you have certain conditions. Talk to your health care provider about which screenings and vaccines you need and how often you need them. This information is not intended to replace advice given to you by your health care provider. Make sure you discuss any questions you have with your health care provider. Document Released: 08/28/2015 Document Revised: 09/21/2017 Document Reviewed: 06/02/2015 Elsevier Interactive Patient Education  2019 [employer].

## 2018-10-18 ENCOUNTER — Telehealth: Payer: Self-pay | Admitting: Internal Medicine

## 2018-10-18 NOTE — Telephone Encounter (Signed)
Copied from [place] [id number]. Topic: General - Other >> Oct 15, 2018  2:15 PM Lennox Solders wrote: Reason for CRM: pt saw dr Jerilee Hoh on 10-05-2018 and would like blood work and progress note to be mail to home address

## 2019-01-21 ENCOUNTER — Ambulatory Visit (INDEPENDENT_AMBULATORY_CARE_PROVIDER_SITE_OTHER): Payer: BC Managed Care – PPO | Admitting: Family Medicine

## 2019-01-21 ENCOUNTER — Other Ambulatory Visit: Payer: Self-pay

## 2019-01-21 ENCOUNTER — Encounter: Payer: Self-pay | Admitting: Family Medicine

## 2019-01-21 DIAGNOSIS — K1370 Unspecified lesions of oral mucosa: Secondary | ICD-10-CM | POA: Diagnosis not present

## 2019-01-21 DIAGNOSIS — T65811A Toxic effect of latex, accidental (unintentional), initial encounter: Secondary | ICD-10-CM | POA: Diagnosis not present

## 2019-01-21 NOTE — Progress Notes (Signed)
Virtual Visit via Video Note  I connected with Samantha Delacruz on 01/21/19 at  9:30 AM EDT by a video enabled telemedicine application and verified that I am speaking with the correct person using two identifiers.  Location patient: home Location provider:work or home office Persons participating in the virtual visit: patient, provider  I discussed the limitations of evaluation and management by telemedicine and the availability of in person appointments. The patient expressed understanding and agreed to proceed.   HPI: Pt has a latex allergy, but forgot about it.  States painted her laundry room over the weekend and noticed a blister in her mouth.  The blister progressed to other welps in her mouth.  This is when pt remembered her allergy.  Pt took allertec allergy med and benadryl. Pt endorses nausea, an episode of emesis this am after taking more benadryl.  She denies itching, hives, SOB, diarrhea, constipation, hoarse voice.  Pt states her anxiety increased as she was worried she had anaphylaxis or COVID-19.  Also endorses increased anxiety surrounding the curfew in place for [place] due to the protest against police brutality.   ROS: See pertinent positives and negatives per HPI.  Past Medical History:  Diagnosis Date  . Adult ADHD   . Anemia    history of  . Asthma   . Headache    Migraines  . Heart palpitations   . Hemorrhoids, internal, with bleeding and mucous discharge - Grade 1 02/05/2014  . Migraines   . Pneumonia   . Restless leg   . Rosacea   . Shingles   . Vertigo     Past Surgical History:  Procedure Laterality Date  . CESAREAN SECTION  08/15/92   son  . DILATATION & CURETTAGE/HYSTEROSCOPY WITH MYOSURE N/A 04/19/2016   Procedure: DILATATION & CURETTAGE/HYSTEROSCOPY WITH MYOSURE;  Surgeon: Anastasio Auerbach, MD;  Location: [facility];  Service: Gynecology;  Laterality: N/A;  request 7:30am start time   Requests one hour OR time.     Family History   Problem Relation Age of Onset  . Depression Mother   . Diabetes Mother   . Emphysema Mother   . Stroke Mother   . Cancer Mother        uterine   . Heart disease Father   . Heart failure Father   . Thyroid disease Sister   . Thyroid disease Brother   . ADD / ADHD Son   . Stroke Maternal Grandmother   . Cancer Paternal Grandfather        esophgus   . Colon cancer Neg Hx      Current Outpatient Medications:  .  albuterol (PROAIR HFA) 108 (90 Base) MCG/ACT inhaler, INHALE 2 PUFFS EVERY 4 HOURS AS NEEDED FOR WHEEZING OR SHORTNESS OF BREATH, Disp: 8.5 Inhaler, Rfl: 2 .  fluticasone (FLONASE) 50 MCG/ACT nasal spray, USE 1 SPRAY IN EACH NOSTRIL DAILY, Disp: 16 g, Rfl: 0 .  fluticasone-salmeterol (ADVAIR HFA) 230-21 MCG/ACT inhaler, TAKE 2 PUFFS BY MOUTH TWICE A DAY, Disp: 24 Inhaler, Rfl: 0 .  Multiple Vitamin (MULTIVITAMIN) tablet, Take 1 tablet by mouth daily., Disp: , Rfl:   EXAM:  VITALS per patient if applicable: RR between 16-10 bpm, doing well on RA.  GENERAL: alert, oriented, appears well and in no acute distress  HEENT: atraumatic, conjunctiva clear, no obvious abnormalities on inspection of external nose and ears.  Discoloration of buccal mucosa.  An ulcer noted on L inside lip.  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 -questions answered. -ok to take Benadryl prn.  Consider PPI. -given precautions  Oral  lesion -supportive care  f/u with pcp prn  I discussed the assessment and treatment plan with the patient. The patient was provided an opportunity to ask questions and all were answered. The patient agreed with the plan and demonstrated an understanding of the instructions.   The patient was advised to call back or seek an in-person evaluation if the symptoms worsen or if the condition fails to improve as anticipated.   Billie Ruddy, MD

## 2019-02-19 ENCOUNTER — Telehealth: Payer: Self-pay | Admitting: *Deleted

## 2019-02-19 NOTE — Telephone Encounter (Signed)
Copied from [place] [id number]. Topic: General - Inquiry >> Feb 19, 2019  9:56 AM Mathis Bud wrote: Reason for CRM: patient is requesting to get her shingles shot outside in her car.  Patient is having anxiety about coming in to office.  Call back [phone]  Clinic RN spoke with patient. Informed patient that clinic staff are not comfortable given vaccines outside in cars. Patient verbalized understanding. Advised patient of the measures we are taken to keep patient safe. Patient verbalized understanding.

## 2019-02-20 ENCOUNTER — Other Ambulatory Visit: Payer: Self-pay

## 2019-02-20 ENCOUNTER — Ambulatory Visit (INDEPENDENT_AMBULATORY_CARE_PROVIDER_SITE_OTHER): Payer: BC Managed Care – PPO | Admitting: *Deleted

## 2019-02-20 DIAGNOSIS — Z23 Encounter for immunization: Secondary | ICD-10-CM

## 2019-02-20 NOTE — Progress Notes (Addendum)
Per orders of [employer], injection of Shingrix given by Westley Hummer. Patient tolerated injection well.

## 2019-02-26 ENCOUNTER — Ambulatory Visit
Admission: RE | Admit: 2019-02-26 | Discharge: 2019-02-26 | Disposition: A | Discharge status: Home / Self Care | Payer: BC Managed Care – PPO | Source: Ambulatory Visit | Attending: Gynecology | Admitting: Gynecology

## 2019-02-26 ENCOUNTER — Other Ambulatory Visit: Payer: Self-pay

## 2019-02-26 DIAGNOSIS — Z1231 Encounter for screening mammogram for malignant neoplasm of breast: Secondary | ICD-10-CM

## 2019-03-14 ENCOUNTER — Ambulatory Visit: Payer: BC Managed Care – PPO | Admitting: Neurology

## 2019-04-23 ENCOUNTER — Other Ambulatory Visit: Payer: Self-pay

## 2019-04-24 ENCOUNTER — Ambulatory Visit: Payer: BC Managed Care – PPO | Admitting: Gynecology

## 2019-04-24 ENCOUNTER — Encounter: Payer: Self-pay | Admitting: Gynecology

## 2019-04-24 VITALS — BP 114/72

## 2019-04-24 DIAGNOSIS — B9689 Other specified bacterial agents as the cause of diseases classified elsewhere: Secondary | ICD-10-CM

## 2019-04-24 DIAGNOSIS — N898 Other specified noninflammatory disorders of vagina: Secondary | ICD-10-CM

## 2019-04-24 DIAGNOSIS — N760 Acute vaginitis: Secondary | ICD-10-CM

## 2019-04-24 LAB — WET PREP FOR TRICH, YEAST, CLUE

## 2019-04-24 MED ORDER — METRONIDAZOLE 500 MG PO TABS: 500.0000 mg | ORAL_TABLET | Freq: Two times a day (BID) | ORAL | 0 refills | Status: DC

## 2019-04-24 NOTE — Progress Notes (Signed)
    Samantha Delacruz Oct 23, 1965 [id number]        53 y.o.  G1P1 presents with 2 days of vaginal odor slight discharge and mild irritation.  Had discomfort with intercourse the last time she had intercourse.  No urinary symptoms such as frequency dysuria urgency low back pain fever or chills.  Past medical history,surgical history, problem list, medications, allergies, family history and social history were all reviewed and documented in the EPIC chart.  Directed ROS with pertinent positives and negatives documented in the history of present illness/assessment and plan.  Exam: Copywriter, advertising Vitals:   04/24/19 1022  BP: 114/72   General appearance:  Normal Abdomen soft nontender without masses guarding rebound Pelvic external BUS vagina with white discharge.  Bimanual without masses or tenderness.  Assessment/Plan:  53 y.o. G1P1 with history and exam as above.  Wet preps consistent with bacterial vaginosis.  Options for treatment reviewed and patient elects for Flagyl 500 mg twice daily x7 days.  Alcohol avoidance reviewed.  Follow-up if symptoms persist, worsen or recur.    Anastasio Auerbach MD, 10:38 AM 04/24/2019

## 2019-04-24 NOTE — Patient Instructions (Signed)
Take the prescribed antibiotic twice daily for 7 days.  Avoid alcohol while taking.  Follow-up if your symptoms persist, worsen or recur.

## 2019-05-03 ENCOUNTER — Ambulatory Visit: Payer: BC Managed Care – PPO | Admitting: Family Medicine

## 2019-05-03 ENCOUNTER — Ambulatory Visit (HOSPITAL_COMMUNITY)
Admission: EM | Admit: 2019-05-03 | Discharge: 2019-05-03 | Disposition: A | Discharge status: Home / Self Care | Payer: BC Managed Care – PPO | Attending: Family Medicine | Admitting: Family Medicine

## 2019-05-03 ENCOUNTER — Encounter (HOSPITAL_COMMUNITY): Payer: Self-pay

## 2019-05-03 ENCOUNTER — Other Ambulatory Visit: Payer: Self-pay

## 2019-05-03 DIAGNOSIS — R0989 Other specified symptoms and signs involving the circulatory and respiratory systems: Secondary | ICD-10-CM

## 2019-05-03 NOTE — ED Provider Notes (Signed)
[facility]    CSN: [id number] Arrival date & time: 05/03/19  1558      History   Chief Complaint Chief Complaint  Patient presents with  . Throat Issue    HPI Samantha Delacruz is a 53 y.o. female.   She is presenting with concern of lesions in her mouth and throat.  She reports moving into a new house and suspecting bugs in her house.  She had her house checked for bedbugs and this was normal.  She feels like there is lesions in her mouth and throat that are causing her throat to be irritated.  Denies any fevers or chills.  Denies any travel recently.  HPI  Past Medical History:  Diagnosis Date  . Adult ADHD   . Anemia    history of  . Asthma   . Headache    Migraines  . Heart palpitations   . Hemorrhoids, internal, with bleeding and mucous discharge - Grade 1 02/05/2014  . Migraines   . Pneumonia   . Restless leg   . Rosacea   . Shingles   . Vertigo     Patient Active Problem List   Diagnosis Date Noted  . Right hip pain 08/21/2014  . Palpitations 08/04/2014  . 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 Rectal bleeding 12/10/2013  . Allergic rhinitis 09/26/2013  . Benign paroxysmal positional vertigo 06/20/2013  . Seborrheic keratosis, inflamed 05/23/2013  . Fibrocystic breast changes 10/02/2012  . Overweight 10/02/2012    Past Surgical History:  Procedure Laterality Date  . CESAREAN SECTION  08/15/92   son  . DILATATION & CURETTAGE/HYSTEROSCOPY WITH MYOSURE N/A 04/19/2016   Procedure: DILATATION & CURETTAGE/HYSTEROSCOPY WITH MYOSURE;  Surgeon: Anastasio Auerbach, MD;  Location: [facility];  Service: Gynecology;  Laterality: N/A;  request 7:30am start time   Requests one hour OR time.     OB History    Gravida  1   Para  1   Term      Preterm      AB      Living  1     SAB      TAB      Ectopic      Multiple      Live Births               Home Medications    Prior to Admission medications   Medication Sig Start Date End Date  Taking? Authorizing Provider  albuterol (PROAIR HFA) 108 (90 Base) MCG/ACT inhaler INHALE 2 PUFFS EVERY 4 HOURS AS NEEDED FOR WHEEZING OR SHORTNESS OF BREATH 06/19/18   Dorena Cookey, MD  fluticasone [facility]) 50 MCG/ACT nasal spray USE 1 SPRAY IN EACH NOSTRIL DAILY 10/25/17   Billie Ruddy, MD  fluticasone-salmeterol (ADVAIR HFA) [phone] MCG/ACT inhaler TAKE 2 PUFFS BY MOUTH TWICE A DAY 08/17/18   Marletta Lor, MD  metroNIDAZOLE (FLAGYL) 500 MG tablet Take 1 tablet (500 mg total) by mouth 2 (two) times daily. For 7 days.  Avoid alcohol while taking 04/24/19   Fontaine, Belinda Block, MD  Multiple Vitamin (MULTIVITAMIN) tablet Take 1 tablet by mouth daily.    [provider]    Family History Family History  Problem Relation Age of Onset  . Depression Mother   . Diabetes Mother   . Emphysema Mother   . Stroke Mother   . Cancer Mother        uterine   . Heart disease Father   .  Heart failure Father   . Thyroid disease Sister   . Thyroid disease Brother   . ADD / ADHD Son   . Stroke Maternal Grandmother   . Cancer Paternal Grandfather        esophgus   . Colon cancer Neg Hx     Social History Social History   Tobacco Use  . Smoking status: Never Smoker  . Smokeless tobacco: Never Used  Substance Use Topics  . Alcohol use: Yes    Alcohol/week: 0.0 standard drinks    Comment: occ beer  . Drug use: No     Allergies   Latex, Flu virus vaccine, Codeine, Lactose intolerance (gi), Mobic [meloxicam], and Sulfa antibiotics   Review of Systems Review of Systems  Constitutional: Negative for fever.  HENT: Negative for congestion.   Respiratory: Negative for cough.   Cardiovascular: Negative for chest pain.  Gastrointestinal: Negative for abdominal pain.  Musculoskeletal: Negative for back pain.  Skin: Negative for color change.  Neurological: Negative for weakness.  Hematological: Negative for adenopathy.     Physical Exam Triage Vital Signs ED Triage  Vitals [05/03/19 1623]  Enc Vitals Group     BP (!) 176/107     Pulse Rate 95     Resp 17     Temp 98.4 F (36.9 C)     Temp Source Oral     SpO2 97 %     Weight      Height      Head Circumference      Peak Flow      Pain Score      Pain Loc      Pain Edu?      Excl. in [place]?    No data found.  Updated Vital Signs BP (!) 176/107 (BP Location: Right Arm)   Pulse 95   Temp 98.4 F (36.9 C) (Oral)   Resp 17   LMP 07/01/2017   SpO2 97%   Visual Acuity Right Eye Distance:   Left Eye Distance:   Bilateral Distance:    Right Eye Near:   Left Eye Near:    Bilateral Near:     Physical Exam Gen: NAD, alert, cooperative with exam,  ENT: normal lips, normal nasal mucosa, tympanic membranes clear and intact bilaterally, normal oropharynx, no cervical lymphadenopathy Eye: normal EOM, normal conjunctiva and lids CV:  no edema, +2 pedal pulses,   Resp: no accessory muscle use, non-labored,  Skin: no rashes, no areas of induration  Neuro: normal tone, normal sensation to touch Psych:  normal insight, alert and oriented MSK: Normal gait, normal strength    UC Treatments / Results  Labs (all labs ordered are listed, but only abnormal results are displayed) Labs Reviewed - No data to display  EKG   Radiology No results found.  Procedures Procedures (including critical care time)  Medications Ordered in UC Medications - No data to display  Initial Impression / Assessment and Plan / UC Course  I have reviewed the triage vital signs and the nursing notes.  Pertinent labs & imaging results that were available during my care of the patient were reviewed by me and considered in my medical decision making (see chart for details).     [name] is a 53 yo F that is presenting with concern for lesions in her throat. Possible for globus sensation. No visible lesions demonstrated. BP was elevated. Counseled on having close follow up and to re-check. Anxiety may be playing a  component.  See may try famotidine. Counseled on supportive care. Provided indications to follow up and return.   Final Clinical Impressions(s) / UC Diagnoses   Final diagnoses:  None   Discharge Instructions   None    ED Prescriptions    None     PDMP not reviewed this encounter.   Rosemarie Ax, MD 05/03/19 [phone]

## 2019-05-03 NOTE — Discharge Instructions (Signed)
Please monitor your symptoms.  You may need to see a GI doctor if your symptoms continue

## 2019-05-03 NOTE — ED Triage Notes (Signed)
Pt presents with throat issues; pt states that she has just moved into her new home and has been having what she describes as possible an allergic reaction to an unknown source in her home.

## 2019-05-06 ENCOUNTER — Telehealth: Payer: Self-pay | Admitting: *Deleted

## 2019-05-06 ENCOUNTER — Ambulatory Visit: Payer: BC Managed Care – PPO | Admitting: Women's Health

## 2019-05-06 ENCOUNTER — Encounter: Payer: Self-pay | Admitting: Women's Health

## 2019-05-06 ENCOUNTER — Other Ambulatory Visit: Payer: Self-pay

## 2019-05-06 VITALS — BP 150/86

## 2019-05-06 DIAGNOSIS — K143 Hypertrophy of tongue papillae: Secondary | ICD-10-CM | POA: Diagnosis not present

## 2019-05-06 DIAGNOSIS — N898 Other specified noninflammatory disorders of vagina: Secondary | ICD-10-CM

## 2019-05-06 LAB — WET PREP FOR TRICH, YEAST, CLUE

## 2019-05-06 MED ORDER — MAGIC MOUTHWASH W/LIDOCAINE: 5.0000 mL | Freq: Three times a day (TID) | ORAL | 0 refills | Status: DC

## 2019-05-06 NOTE — Telephone Encounter (Signed)
[facility] called stating they need the ingredients for Magic Mouth wash in order to fill [phone]  Will send Magic Mouth wash to [facility] pt to inform pt of pharmacy change.

## 2019-05-06 NOTE — Patient Instructions (Signed)

## 2019-05-06 NOTE — Telephone Encounter (Signed)
Pt called stating she took a course of antibiotics and has developed  yeast infection in her mouth symptoms include: Cotton-like feeling in the mouth.green  patches on the inner cheeks, tongue, roof of the mouth, and throat, itching in mouth and vagina. Pt will schedule appt

## 2019-05-06 NOTE — Progress Notes (Signed)
53 y.o.SWF G1P1 presents with concern of green tongue and white patches on back on tongue and throat. Was seen at ED on 05/03/19 for same symptoms. On Thursday night prior to ED visit used her rescue inhaler for throat tighten sensation, also uses Advair daily. Educated on proper mouth care/rinsing after inhaler use for which she hasn't been doing. Has a history of anxiety and sees a therapist weekly. Also concerned and verbalizes she feels like something is moving inside her vagina. Has same partner with negative STD screen, declines STD screen but not confident he is monogamous. No dysuria, frequency or urgency.  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  Past medical history include anxiety, ADD, elevated blood pressure at other offices although states does not have high blood pressure.  . Mom is deceased had history of CVA, bipolar disease and suicidal ideations.  Per pt. 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   Exam: Well-appearing. No acute distress, somewhat anxious talking rapidly. Lungs clear without wheezes. Heart sounds regular. Oral pharynx pink, moist without erythema, no white patches or exudate. Tongue is coated,  slightly green in color. No cervical adenopathy.  Strep culture taken.  External genital without erythematous. No malodor. Speculum exam normal discharge, Vaginal wall without erythematous.  Wet prep was negative.  Elevated blood pressure Questionable thrush Anxiety  Plan: STI/HIV prevention discussed and reviewed. Magic mouthwash prescribed, 72mLs TID.  Check blood pressure at home if continues greater than 130/80 follow-up with primary care, continue counseling with therapist, reviewed possible need for medication.  Strep culture pending will call if positive.  Reviewed importance of decreasing caffeinated beverages after morning coffee, drink more water.

## 2019-05-08 ENCOUNTER — Encounter: Payer: Self-pay | Admitting: Gynecology

## 2019-05-08 LAB — STREP A DNA PROBE: Group A Strep Probe: NOT DETECTED

## 2019-05-10 ENCOUNTER — Other Ambulatory Visit: Payer: Self-pay

## 2019-05-10 MED ORDER — MAGIC MOUTHWASH W/LIDOCAINE: 5.0000 mL | Freq: Three times a day (TID) | ORAL | 0 refills | Status: DC

## 2019-05-10 NOTE — Telephone Encounter (Signed)
I spoke with patient.Patient said she is so much better but got only a very small amount of the mouthwash and is at the bottom of bottle. Pharmacist agreed that 60 ml not very much. Would only last her 4 days.  Patient asked if I would send 180 mls so she could be sure she has enough to see this to the end. I spoke with pharmacist and gave her the refill Rx for 180 mls.

## 2019-05-11 NOTE — Telephone Encounter (Signed)
thanks

## 2019-05-13 ENCOUNTER — Other Ambulatory Visit: Payer: Self-pay

## 2019-05-13 ENCOUNTER — Emergency Department (HOSPITAL_COMMUNITY)
Admission: EM | Admit: 2019-05-13 | Discharge: 2019-05-13 | Disposition: A | Discharge status: Home / Self Care | Payer: BC Managed Care – PPO | Attending: Emergency Medicine | Admitting: Emergency Medicine

## 2019-05-13 ENCOUNTER — Ambulatory Visit: Payer: Self-pay | Admitting: Internal Medicine

## 2019-05-13 ENCOUNTER — Encounter (HOSPITAL_COMMUNITY): Payer: Self-pay

## 2019-05-13 DIAGNOSIS — Z79899 Other long term (current) drug therapy: Secondary | ICD-10-CM | POA: Insufficient documentation

## 2019-05-13 DIAGNOSIS — Z91040 Latex allergy status: Secondary | ICD-10-CM | POA: Insufficient documentation

## 2019-05-13 DIAGNOSIS — J45909 Unspecified asthma, uncomplicated: Secondary | ICD-10-CM | POA: Insufficient documentation

## 2019-05-13 DIAGNOSIS — I10 Essential (primary) hypertension: Secondary | ICD-10-CM | POA: Insufficient documentation

## 2019-05-13 DIAGNOSIS — G44209 Tension-type headache, unspecified, not intractable: Secondary | ICD-10-CM | POA: Insufficient documentation

## 2019-05-13 DIAGNOSIS — R51 Headache: Secondary | ICD-10-CM | POA: Diagnosis present

## 2019-05-13 LAB — CBC
HCT: 45.2 % (ref 36.0–46.0)
Hemoglobin: 15.4 g/dL — ABNORMAL HIGH (ref 12.0–15.0)
MCH: 29.6 pg (ref 26.0–34.0)
MCHC: 34.1 g/dL (ref 30.0–36.0)
MCV: 86.9 fL (ref 80.0–100.0)
Platelets: 332 10*3/uL (ref 150–400)
RBC: 5.2 MIL/uL — ABNORMAL HIGH (ref 3.87–5.11)
RDW: 12.4 % (ref 11.5–15.5)
WBC: 8.1 10*3/uL (ref 4.0–10.5)
nRBC: 0 % (ref 0.0–0.2)

## 2019-05-13 LAB — BASIC METABOLIC PANEL
Anion gap: 17 — ABNORMAL HIGH (ref 5–15)
BUN: 12 mg/dL (ref 6–20)
CO2: 20 mmol/L — ABNORMAL LOW (ref 22–32)
Calcium: 9.5 mg/dL (ref 8.9–10.3)
Chloride: 103 mmol/L (ref 98–111)
Creatinine, Ser: 0.64 mg/dL (ref 0.44–1.00)
GFR calc Af Amer: >60 mL/min (ref >60)
GFR calc non Af Amer: >60 mL/min (ref >60)
Glucose, Bld: 98 mg/dL (ref 70–99)
Potassium: 3.7 mmol/L (ref 3.5–5.1)
Sodium: 140 mmol/L (ref 135–145)

## 2019-05-13 NOTE — ED Provider Notes (Signed)
Samantha Delac[facility] DEPT Provider Note   CSN: [id number] Arrival date & time: 05/13/19  1217     History   Chief Complaint Chief Complaint  Patient presents with  . Hypertension    HPI Samantha Delacruz is a 53 y.o. female.  53 year old lady with possible history of migraines presents the ER with chief complaint headache, elevated blood pressure.  Patient states that this morning she noted mild to moderate headache.  States this is similar to prior, not sudden onset, thought initially due to caffeine withdrawal.  Drank coffee and had significant improvement in her headache but then later in the day states that the headache came back.  She checked her blood pressure at work and noted it to be up to 123XX123 systolic and came to the emergency department.  She denies any numbness, weakness, vision changes, speech changes, ambulation changes.  States this is a relatively mild headache compared to her severe migraines.  Does not take any medications for blood pressure.  Has a primary care doctor.  Has a appointment with her neurologist later this week.     HPI  Past Medical History:  Diagnosis Date  . Adult ADHD   . Anemia    history of  . Asthma   . Headache    Migraines  . Heart palpitations   . Hemorrhoids, internal, with bleeding and mucous discharge - Grade 1 02/05/2014  . Migraines   . Pneumonia   . Restless leg   . Rosacea   . Shingles   . Vertigo     Patient Active Problem List   Diagnosis Date Noted  . Right hip pain 08/21/2014  . Palpitations 08/04/2014  . 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 Rectal bleeding 12/10/2013  . Allergic rhinitis 09/26/2013  . Benign paroxysmal positional vertigo 06/20/2013  . Seborrheic keratosis, inflamed 05/23/2013  . Fibrocystic breast changes 10/02/2012  . Overweight 10/02/2012    Past Surgical History:  Procedure Laterality Date  . CESAREAN SECTION  08/15/92   son  . DILATATION & CURETTAGE/HYSTEROSCOPY WITH MYOSURE  N/A 04/19/2016   Procedure: DILATATION & CURETTAGE/HYSTEROSCOPY WITH MYOSURE;  Surgeon: Anastasio Auerbach, MD;  Location: [facility];  Service: Gynecology;  Laterality: N/A;  request 7:30am start time   Requests one hour OR time.   . thrush       OB History    Gravida  1   Para  1   Term      Preterm      AB      Living  1     SAB      TAB      Ectopic      Multiple      Live Births               Home Medications    Prior to Admission medications   Medication Sig Start Date End Date Taking? Authorizing Provider  albuterol (PROAIR HFA) 108 (90 Base) MCG/ACT inhaler INHALE 2 PUFFS EVERY 4 HOURS AS NEEDED FOR WHEEZING OR SHORTNESS OF BREATH Patient taking differently: Inhale 2 puffs into the lungs every 4 (four) hours as needed for wheezing or shortness of breath.  06/19/18  Yes Dorena Cookey, MD  fluticasone-salmeterol (ADVAIR HFA) 230-21 MCG/ACT inhaler TAKE 2 PUFFS BY MOUTH TWICE A DAY Patient taking differently: Inhale 2 puffs into the lungs 2 (two) times daily. TAKE 2 PUFFS BY MOUTH TWICE A DAY 08/17/18  Yes Marletta Lor, MD  magic  mouthwash w/lidocaine SOLN Take 5 mLs by mouth 3 (three) times daily. Swish and spit. 05/10/19  Yes Huel Cote, NP  Multiple Vitamin (MULTIVITAMIN) tablet Take 1 tablet by mouth daily.   Yes [provider]  fluticasone (FLONASE) 50 MCG/ACT nasal spray USE 1 SPRAY IN EACH NOSTRIL DAILY Patient not taking: Reported on 05/06/2019 10/25/17   Billie Ruddy, MD  M-DRYL 12.5 MG/5ML liquid Take 12.5 mg by mouth daily. 05/10/19   [provider]    Family History Family History  Problem Relation Age of Onset  . Depression Mother   . Diabetes Mother   . Emphysema Mother   . Stroke Mother   . Cancer Mother        uterine   . Heart disease Father   . Heart failure Father   . Thyroid disease Sister   . Thyroid disease Brother   . ADD / ADHD Son   . Stroke Maternal Grandmother   . Cancer Paternal Grandfather         esophgus   . Colon cancer Neg Hx     Social History Social History   Tobacco Use  . Smoking status: Never Smoker  . Smokeless tobacco: Never Used  Substance Use Topics  . Alcohol use: Yes    Alcohol/week: 0.0 standard drinks    Comment: occ beer  . Drug use: No     Allergies   Latex, Flu virus vaccine, Codeine, Lactose intolerance (gi), Mobic [meloxicam], and Sulfa antibiotics   Review of Systems Review of Systems  Constitutional: Negative for chills and fever.  HENT: Negative for ear pain and sore throat.   Eyes: Negative for pain and visual disturbance.  Respiratory: Negative for cough and shortness of breath.   Cardiovascular: Negative for chest pain and palpitations.  Gastrointestinal: Negative for abdominal pain and vomiting.  Genitourinary: Negative for dysuria and hematuria.  Musculoskeletal: Negative for arthralgias and back pain.  Skin: Negative for color change and rash.  Neurological: Positive for headaches. Negative for seizures and syncope.  All other systems reviewed and are negative.    Physical Exam Updated Vital Signs BP (!) 151/113 (BP Location: Left Arm)   Pulse (!) 103   Temp 98.9 F (37.2 C) (Oral)   Resp 16   Ht 5\' 5"  (1.651 m)   Wt 65.8 kg   LMP 07/01/2017   SpO2 97%   BMI 24.13 kg/m   Physical Exam Vitals signs and nursing note reviewed.  Constitutional:      General: She is not in acute distress.    Appearance: She is well-developed.  HENT:     Head: Normocephalic and atraumatic.  Eyes:     Conjunctiva/sclera: Conjunctivae normal.  Neck:     Musculoskeletal: Neck supple.  Cardiovascular:     Rate and Rhythm: Normal rate and regular rhythm.     Heart sounds: No murmur.  Pulmonary:     Effort: Pulmonary effort is normal. No respiratory distress.     Breath sounds: Normal breath sounds.  Abdominal:     Palpations: Abdomen is soft.     Tenderness: There is no abdominal tenderness.  Skin:    General: Skin is warm and  dry.  Neurological:     General: No focal deficit present.     Mental Status: She is alert and oriented to person, place, and time.     Cranial Nerves: No cranial nerve deficit.     Sensory: No sensory deficit.  Motor: No weakness.     Coordination: Coordination normal.     Gait: Gait normal.      ED Treatments / Results  Labs (all labs ordered are listed, but only abnormal results are displayed) Labs Reviewed  BASIC METABOLIC PANEL - Abnormal; Notable for the following components:      Result Value   CO2 20 (*)    Anion gap 17 (*)    All other components within normal limits  CBC - Abnormal; Notable for the following components:   RBC 5.20 (*)    Hemoglobin 15.4 (*)    All other components within normal limits    EKG None  Radiology No results found.  Procedures Procedures (including critical care time)  Medications Ordered in ED Medications - No data to display   Initial Impression / Assessment and Plan / ED Course  I have reviewed the triage vital signs and the nursing notes.  Pertinent labs & imaging results that were available during my care of the patient were reviewed by me and considered in my medical decision making (see chart for details).  Clinical Course as of May 13 22  Mon May 13, 2019  2112 Assessed patient, headache 2 out of 10 in severity, blood pressure 147/100, well-appearing, reviewed results, will discharge home   [RD]    Clinical Course User Index [RD] Lucrezia Starch, MD       53 year old lady presented to ER with hypertension, headache.  On exam well-appearing, headache at near completely resolved by the time I evaluated patient.  Not sudden onset, not worst headache, has long history of more severe migraines.  Patient primary concern was her elevated blood pressure.  Blood pressures spontaneously improved to [id number] systolic, screening labs were unremarkable.  EKG was without acute ischemic changes.  Recommend patient follow-up  with her primary care doctor and have her blood pressure rechecked in the office.  Reviewed return precautions, will dc home.    After the discussed management above, the patient was determined to be safe for discharge.  The patient was in agreement with this plan and all questions regarding their care were answered.  ED return precautions were discussed and the patient will return to the ED with any significant worsening of condition.    Final Clinical Impressions(s) / ED Diagnoses   Final diagnoses:  Hypertension, unspecified type  Tension headache    ED Discharge Orders    None       Lucrezia Starch, MD 05/14/19 [phone]

## 2019-05-13 NOTE — Telephone Encounter (Signed)
Will monitor for ED arrival.  

## 2019-05-13 NOTE — ED Triage Notes (Signed)
Patient reports that her BP at school was174/123 right arm and left arm 164/106. Patient c/o right-sided headache.  BP in triage 153/95.

## 2019-05-13 NOTE — ED Notes (Signed)
Pt verbalized discharge instructions and follow up care. Alert and ambulatory. No iv. Pt is waiting on ride home from friend.

## 2019-05-13 NOTE — Discharge Instructions (Signed)
Please call your primary care doctor to schedule appointment for close recheck, ideally to be seen this week.  Please keep your appointment with your neurologist as previously scheduled.  Recommend continuing Tylenol, Motrin as needed for headaches.  If you feel that your headache worsens, you develop vomiting, neck stiffness, fever, numbness or weakness, vision changes please return to ER for reassessment.

## 2019-05-13 NOTE — Telephone Encounter (Signed)
Pt reports "Bad headache Friday, went to walk in clinic, BP 175/100. States severe headache today, right sided, BP checked at workplace: right arm 174/123  Left arm 164/106.  Reports "Right eye jerking." Pt is not on any BP meds.  States headache and dizziness last week, unsure of BP at that time. Pt very anxious during call. Directed to ED. Verbalizes understanding, coworker will drive.  Reason for Disposition . [id number] Systolic BP  >= 0000000 OR Diastolic >= [id number] AND [id number] cardiac or neurologic symptoms (e.g., chest pain, difficulty breathing, unsteady gait, blurred vision)  Answer Assessment - Initial Assessment Questions 1. BLOOD PRESSURE: "What is the blood pressure?" "Did you take at least two measurements 5 minutes apart?"     Yes right arm 174/123  Left arm 164/106 2. ONSET: "When did you take your blood pressure?"     Now 3. HOW: "How did you obtain the blood pressure?" (e.g., visiting nurse, automatic home BP monitor)     Workplace nurse  "First responder" 4. HISTORY: "Do you have a history of high blood pressure?"     no 5. MEDICATIONS: "Are you taking any medications for blood pressure?" "Have you missed any doses recently?"     Not on any 6. OTHER SYMPTOMS: "Do you have any symptoms?" (e.g., headache, chest pain, blurred vision, difficulty breathing, weakness)     Severe headache , right sided, "Right eye jerking"  Protocols used: HIGH BLOOD PRESSURE-A-AH

## 2019-05-13 NOTE — ED Notes (Signed)
Patient has a urine sample and culture in the main lab 

## 2019-05-13 NOTE — Telephone Encounter (Signed)
Patient has arrived to ED.

## 2019-05-14 ENCOUNTER — Ambulatory Visit: Payer: BC Managed Care – PPO | Admitting: Internal Medicine

## 2019-05-14 ENCOUNTER — Encounter: Payer: Self-pay | Admitting: Internal Medicine

## 2019-05-14 VITALS — BP 120/90 | HR 105 | Temp 98.2°F | Wt 150.0 lb

## 2019-05-14 DIAGNOSIS — I10 Essential (primary) hypertension: Secondary | ICD-10-CM | POA: Diagnosis not present

## 2019-05-14 DIAGNOSIS — Z09 Encounter for follow-up examination after completed treatment for conditions other than malignant neoplasm: Secondary | ICD-10-CM | POA: Diagnosis not present

## 2019-05-14 MED ORDER — HYDROCHLOROTHIAZIDE 12.5 MG PO CAPS: 12.5000 mg | ORAL_CAPSULE | Freq: Every day | ORAL | 0 refills | Status: DC

## 2019-05-14 NOTE — Progress Notes (Signed)
Established Patient Office Visit     CC/Reason for Visit: elevated BP, ED follow up  HPI: Samantha Delacruz is a 53 y.o. female who is coming in today for the above mentioned reasons. Went to UC last week due to high BP and was told her syst BP was 175 asked to follow up with PCP. Yesterday had a HA at work, checked her BP and it was 175/83 and then 164/106. She states she called the office and was advised to go to the ED. There BP was 140/100. She states she had been told by Dr. Sherren Mocha in the past that her BPs were elevated and was asked to do ambulatory BP monitoring which she has not. Work up in the Ed was negative. She has not been started on meds. Her sister has HTN.  Past Medical/Surgical History: Past Medical History:  Diagnosis Date  . Adult ADHD   . Anemia    history of  . Asthma   . Headache    Migraines  . Heart palpitations   . Hemorrhoids, internal, with bleeding and mucous discharge - Grade 1 02/05/2014  . Migraines   . Pneumonia   . Restless leg   . Rosacea   . Shingles   . Vertigo     Past Surgical History:  Procedure Laterality Date  . CESAREAN SECTION  08/15/92   son  . DILATATION & CURETTAGE/HYSTEROSCOPY WITH MYOSURE N/A 04/19/2016   Procedure: DILATATION & CURETTAGE/HYSTEROSCOPY WITH MYOSURE;  Surgeon: Anastasio Auerbach, MD;  Location: [facility];  Service: Gynecology;  Laterality: N/A;  request 7:30am start time   Requests one hour OR time.   . thrush      Social History:  reports that she has never smoked. She has never used smokeless tobacco. She reports current alcohol use. She reports that she does not use drugs.  Allergies: Allergies  Allergen Reactions  . Latex Rash  . Flu Virus Vaccine Nausea And Vomiting  . Codeine Hives  . Lactose Intolerance (Gi) Diarrhea    cramping  . Mobic [Meloxicam]   . Sulfa Antibiotics Hives    Family History:  Family History  Problem Relation Age of Onset  . Depression Mother   . Diabetes Mother   .  Emphysema Mother   . Stroke Mother   . Cancer Mother        uterine   . Heart disease Father   . Heart failure Father   . Thyroid disease Sister   . Thyroid disease Brother   . ADD / ADHD Son   . Stroke Maternal Grandmother   . Cancer Paternal Grandfather        esophgus   . Colon cancer Neg Hx      Current Outpatient Medications:  .  albuterol (PROAIR HFA) 108 (90 Base) MCG/ACT inhaler, INHALE 2 PUFFS EVERY 4 HOURS AS NEEDED FOR WHEEZING OR SHORTNESS OF BREATH (Patient taking differently: Inhale 2 puffs into the lungs every 4 (four) hours as needed for wheezing or shortness of breath. ), Disp: 8.5 Inhaler, Rfl: 2 .  fluticasone (FLONASE) 50 MCG/ACT nasal spray, USE 1 SPRAY IN EACH NOSTRIL DAILY, Disp: 16 g, Rfl: 0 .  fluticasone-salmeterol (ADVAIR HFA) 230-21 MCG/ACT inhaler, TAKE 2 PUFFS BY MOUTH TWICE A DAY (Patient taking differently: Inhale 2 puffs into the lungs 2 (two) times daily. TAKE 2 PUFFS BY MOUTH TWICE A DAY), Disp: 24 Inhaler, Rfl: 0 .  M-DRYL 12.5 MG/5ML liquid, Take 12.5 mg by  mouth daily., Disp: , Rfl:  .  magic mouthwash w/lidocaine SOLN, Take 5 mLs by mouth 3 (three) times daily. Swish and spit., Disp: 180 mL, Rfl: 0 .  Multiple Vitamin (MULTIVITAMIN) tablet, Take 1 tablet by mouth daily., Disp: , Rfl:  .  hydrochlorothiazide (MICROZIDE) 12.5 MG capsule, Take 1 capsule (12.5 mg total) by mouth daily., Disp: 90 capsule, Rfl: 0  Review of Systems:  Constitutional: Denies fever, chills, diaphoresis, appetite change and fatigue.  HEENT: Denies photophobia, eye pain, redness, hearing loss, ear pain, congestion, sore throat, rhinorrhea, sneezing, mouth sores, trouble swallowing, neck pain, neck stiffness and tinnitus.   Respiratory: Denies SOB, DOE, cough, chest tightness,  and wheezing.   Cardiovascular: Denies chest pain, palpitations and leg swelling.  Gastrointestinal: Denies nausea, vomiting, abdominal pain, diarrhea, constipation, blood in stool and abdominal  distention.  Genitourinary: Denies dysuria, urgency, frequency, hematuria, flank pain and difficulty urinating.  Endocrine: Denies: hot or cold intolerance, sweats, changes in hair or nails, polyuria, polydipsia. Musculoskeletal: Denies myalgias, back pain, joint swelling, arthralgias and gait problem.  Skin: Denies pallor, rash and wound.  Neurological: Denies dizziness, seizures, syncope, weakness, light-headedness, numbness and headaches.  Hematological: Denies adenopathy. Easy bruising, personal or family bleeding history  Psychiatric/Behavioral: Denies suicidal ideation, mood changes, confusion, nervousness, sleep disturbance and agitation    Physical Exam: Vitals:   05/14/19 1440  BP: 120/90  Pulse: (!) 105  Temp: 98.2 F (36.8 C)  TempSrc: Temporal  Weight: 150 lb (68 kg)    Body mass index is 24.96 kg/m.   Constitutional: NAD, calm, comfortable Eyes: PERRL, lids and conjunctivae normal ENMT: Mucous membranes are moist.  Respiratory: clear to auscultation bilaterally, no wheezing, no crackles. Normal respiratory effort. No accessory muscle use.  Cardiovascular: Regular rate and rhythm, no murmurs / rubs / gallops. No extremity edema. 2+ pedal pulses Abdomen: no tenderness, no masses palpated. No hepatosplenomegaly. Bowel sounds positive.  Musculoskeletal: no clubbing / cyanosis. No joint deformity upper and lower extremities. Good ROM, no contractures. Normal muscle tone.  Neurologic: grossly intact and non-focal..  Psychiatric: Normal judgment and insight. Alert and oriented x 3. Normal mood.    Impression and Plan:  Hospital discharge follow-up Essential hypertension -With multiple very elevated BPs over past 2 weeks and h/o of isolated high BP readings for past 3 years, will diagnose her with HTN and start treatment with HCTZ 12.5 mg. -She will f/u in 6 weeks for BP check and BMET to follow renal function and electrolytes.   Patient Instructions  -Nice seeing  you today!!  -START HCTZ 12.5 mg daily.  -Check blood pressure at home 2-3 times a week and bring numbers into your next visit.  -Low salt diet (see below).  -Schedule follow up in 6 weeks.   DASH Eating Plan DASH stands for "Dietary Approaches to Stop Hypertension." The DASH eating plan is a healthy eating plan that has been shown to reduce high blood pressure (hypertension). It may also reduce your risk for type 2 diabetes, heart disease, and stroke. The DASH eating plan may also help with weight loss. What are tips for following this plan?  General guidelines  Avoid eating more than 2,300 mg (milligrams) of salt (sodium) a day. If you have hypertension, you may need to reduce your sodium intake to 1,500 mg a day.  Limit alcohol intake to no more than 1 drink a day for nonpregnant women and 2 drinks a day for men. One drink equals 12 oz of beer, 5  oz of wine, or 1 oz of hard liquor.  Work with your health care provider to maintain a healthy body weight or to lose weight. Ask what an ideal weight is for you.  Get at least 30 minutes of exercise that causes your heart to beat faster (aerobic exercise) most days of the week. Activities may include walking, swimming, or biking.  Work with your health care provider or diet and nutrition specialist (dietitian) to adjust your eating plan to your individual calorie needs. Reading food labels   Check food labels for the amount of sodium per serving. Choose foods with less than 5 percent of the Daily Value of sodium. Generally, foods with less than 300 mg of sodium per serving fit into this eating plan.  To find whole grains, look for the word "whole" as the first word in the ingredient list. Shopping  Buy products labeled as "low-sodium" or "no salt added."  Buy fresh foods. Avoid canned foods and premade or frozen meals. Cooking  Avoid adding salt when cooking. Use salt-free seasonings or herbs instead of table salt or sea salt.  Check with your health care provider or pharmacist before using salt substitutes.  Do not fry foods. Cook foods using healthy methods such as baking, boiling, grilling, and broiling instead.  Cook with heart-healthy oils, such as olive, canola, soybean, or sunflower oil. Meal planning  Eat a balanced diet that includes: ? 5 or more servings of fruits and vegetables each day. At each meal, try to fill half of your plate with fruits and vegetables. ? Up to 6-8 servings of whole grains each day. ? Less than 6 oz of lean meat, poultry, or fish each day. A 3-oz serving of meat is about the same size as a deck of cards. One egg equals 1 oz. ? 2 servings of low-fat dairy each day. ? A serving of nuts, seeds, or beans 5 times each week. ? Heart-healthy fats. Healthy fats called Omega-3 fatty acids are found in foods such as flaxseeds and coldwater fish, like sardines, salmon, and mackerel.  Limit how much you eat of the following: ? Canned or prepackaged foods. ? Food that is high in trans fat, such as fried foods. ? Food that is high in saturated fat, such as fatty meat. ? Sweets, desserts, sugary drinks, and other foods with added sugar. ? Full-fat dairy products.  Do not salt foods before eating.  Try to eat at least 2 vegetarian meals each week.  Eat more home-cooked food and less restaurant, buffet, and fast food.  When eating at a restaurant, ask that your food be prepared with less salt or no salt, if possible. What foods are recommended? The items listed may not be a complete list. Talk with your dietitian about what dietary choices are best for you. Grains Whole-grain or whole-wheat bread. Whole-grain or whole-wheat pasta. Brown rice. [place]. Bulgur. Whole-grain and low-sodium cereals. Pita bread. Low-fat, low-sodium crackers. Whole-wheat flour tortillas. Vegetables Fresh or frozen vegetables (raw, steamed, roasted, or grilled). Low-sodium or reduced-sodium tomato and  vegetable juice. Low-sodium or reduced-sodium tomato sauce and tomato paste. Low-sodium or reduced-sodium canned vegetables. Fruits All fresh, dried, or frozen fruit. Canned fruit in natural juice (without added sugar). Meat and other protein foods Skinless chicken or [place]. Ground chicken or [place]. Pork with fat trimmed off. Fish and seafood. Egg whites. Dried beans, peas, or lentils. Unsalted nuts, nut butters, and seeds. Unsalted canned beans. Lean cuts of beef with fat trimmed off.  Low-sodium, lean deli meat. Dairy Low-fat (1%) or fat-free (skim) milk. Fat-free, low-fat, or reduced-fat cheeses. Nonfat, low-sodium ricotta or cottage cheese. Low-fat or nonfat yogurt. Low-fat, low-sodium cheese. Fats and oils Soft margarine without trans fats. Vegetable oil. Low-fat, reduced-fat, or light mayonnaise and salad dressings (reduced-sodium). Canola, safflower, olive, soybean, and sunflower oils. Avocado. Seasoning and other foods Herbs. Spices. Seasoning mixes without salt. Unsalted popcorn and pretzels. Fat-free sweets. What foods are not recommended? The items listed may not be a complete list. Talk with your dietitian about what dietary choices are best for you. Grains Baked goods made with fat, such as croissants, muffins, or some breads. Dry pasta or rice meal packs. Vegetables Creamed or fried vegetables. Vegetables in a cheese sauce. Regular canned vegetables (not low-sodium or reduced-sodium). Regular canned tomato sauce and paste (not low-sodium or reduced-sodium). Regular tomato and vegetable juice (not low-sodium or reduced-sodium). Angie Fava. Olives. Fruits Canned fruit in a light or heavy syrup. Fried fruit. Fruit in cream or butter sauce. Meat and other protein foods Fatty cuts of meat. Ribs. Fried meat. Berniece Salines. Sausage. Bologna and other processed lunch meats. Salami. Fatback. Hotdogs. Bratwurst. Salted nuts and seeds. Canned beans with added salt. Canned or smoked fish. Whole eggs or  egg yolks. Chicken or [place] with skin. Dairy Whole or 2% milk, cream, and half-and-half. Whole or full-fat cream cheese. Whole-fat or sweetened yogurt. Full-fat cheese. Nondairy creamers. Whipped toppings. Processed cheese and cheese spreads. Fats and oils Butter. Stick margarine. Lard. Shortening. Ghee. Bacon fat. Tropical oils, such as coconut, palm kernel, or palm oil. Seasoning and other foods Salted popcorn and pretzels. Onion salt, garlic salt, seasoned salt, table salt, and sea salt. Worcestershire sauce. Tartar sauce. Barbecue sauce. Teriyaki sauce. Soy sauce, including reduced-sodium. Steak sauce. Canned and packaged gravies. Fish sauce. Oyster sauce. Cocktail sauce. Horseradish that you find on the shelf. Ketchup. Mustard. Meat flavorings and tenderizers. Bouillon cubes. Hot sauce and Tabasco sauce. Premade or packaged marinades. Premade or packaged taco seasonings. Relishes. Regular salad dressings. Where to find more information:  National Heart, Lung, and [place]: https://wilson-eaton.com/  American Heart Association: www.heart.org Summary  The DASH eating plan is a healthy eating plan that has been shown to reduce high blood pressure (hypertension). It may also reduce your risk for type 2 diabetes, heart disease, and stroke.  With the DASH eating plan, you should limit salt (sodium) intake to 2,300 mg a day. If you have hypertension, you may need to reduce your sodium intake to 1,500 mg a day.  When on the DASH eating plan, aim to eat more fresh fruits and vegetables, whole grains, lean proteins, low-fat dairy, and heart-healthy fats.  Work with your health care provider or diet and nutrition specialist (dietitian) to adjust your eating plan to your individual calorie needs. This information is not intended to replace advice given to you by your health care provider. Make sure you discuss any questions you have with your health care provider. Document Released: 07/21/2011  Document Revised: 07/14/2017 Document Reviewed: 07/25/2016 Elsevier Patient Education  2020 [place], [place] [facility]

## 2019-05-14 NOTE — Patient Instructions (Signed)
-Nice seeing you today!!  -START HCTZ 12.5 mg daily.  -Check blood pressure at home 2-3 times a week and bring numbers into your next visit.  -Low salt diet (see below).  -Schedule follow up in 6 weeks.   DASH Eating Plan DASH stands for "Dietary Approaches to Stop Hypertension." The DASH eating plan is a healthy eating plan that has been shown to reduce high blood pressure (hypertension). It may also reduce your risk for type 2 diabetes, heart disease, and stroke. The DASH eating plan may also help with weight loss. What are tips for following this plan?  General guidelines  Avoid eating more than 2,300 mg (milligrams) of salt (sodium) a day. If you have hypertension, you may need to reduce your sodium intake to 1,500 mg a day.  Limit alcohol intake to no more than 1 drink a day for nonpregnant women and 2 drinks a day for men. One drink equals 12 oz of beer, 5 oz of wine, or 1 oz of hard liquor.  Work with your health care provider to maintain a healthy body weight or to lose weight. Ask what an ideal weight is for you.  Get at least 30 minutes of exercise that causes your heart to beat faster (aerobic exercise) most days of the week. Activities may include walking, swimming, or biking.  Work with your health care provider or diet and nutrition specialist (dietitian) to adjust your eating plan to your individual calorie needs. Reading food labels   Check food labels for the amount of sodium per serving. Choose foods with less than 5 percent of the Daily Value of sodium. Generally, foods with less than 300 mg of sodium per serving fit into this eating plan.  To find whole grains, look for the word "whole" as the first word in the ingredient list. Shopping  Buy products labeled as "low-sodium" or "no salt added."  Buy fresh foods. Avoid canned foods and premade or frozen meals. Cooking  Avoid adding salt when cooking. Use salt-free seasonings or herbs instead of table salt or  sea salt. Check with your health care provider or pharmacist before using salt substitutes.  Do not fry foods. Cook foods using healthy methods such as baking, boiling, grilling, and broiling instead.  Cook with heart-healthy oils, such as olive, canola, soybean, or sunflower oil. Meal planning  Eat a balanced diet that includes: ? 5 or more servings of fruits and vegetables each day. At each meal, try to fill half of your plate with fruits and vegetables. ? Up to 6-8 servings of whole grains each day. ? Less than 6 oz of lean meat, poultry, or fish each day. A 3-oz serving of meat is about the same size as a deck of cards. One egg equals 1 oz. ? 2 servings of low-fat dairy each day. ? A serving of nuts, seeds, or beans 5 times each week. ? Heart-healthy fats. Healthy fats called Omega-3 fatty acids are found in foods such as flaxseeds and coldwater fish, like sardines, salmon, and mackerel.  Limit how much you eat of the following: ? Canned or prepackaged foods. ? Food that is high in trans fat, such as fried foods. ? Food that is high in saturated fat, such as fatty meat. ? Sweets, desserts, sugary drinks, and other foods with added sugar. ? Full-fat dairy products.  Do not salt foods before eating.  Try to eat at least 2 vegetarian meals each week.  Eat more home-cooked food and less restaurant,  buffet, and fast food.  When eating at a restaurant, ask that your food be prepared with less salt or no salt, if possible. What foods are recommended? The items listed may not be a complete list. Talk with your dietitian about what dietary choices are best for you. Grains Whole-grain or whole-wheat bread. Whole-grain or whole-wheat pasta. Brown rice. [place]. Bulgur. Whole-grain and low-sodium cereals. Pita bread. Low-fat, low-sodium crackers. Whole-wheat flour tortillas. Vegetables Fresh or frozen vegetables (raw, steamed, roasted, or grilled). Low-sodium or reduced-sodium  tomato and vegetable juice. Low-sodium or reduced-sodium tomato sauce and tomato paste. Low-sodium or reduced-sodium canned vegetables. Fruits All fresh, dried, or frozen fruit. Canned fruit in natural juice (without added sugar). Meat and other protein foods Skinless chicken or [place]. Ground chicken or [place]. Pork with fat trimmed off. Fish and seafood. Egg whites. Dried beans, peas, or lentils. Unsalted nuts, nut butters, and seeds. Unsalted canned beans. Lean cuts of beef with fat trimmed off. Low-sodium, lean deli meat. Dairy Low-fat (1%) or fat-free (skim) milk. Fat-free, low-fat, or reduced-fat cheeses. Nonfat, low-sodium ricotta or cottage cheese. Low-fat or nonfat yogurt. Low-fat, low-sodium cheese. Fats and oils Soft margarine without trans fats. Vegetable oil. Low-fat, reduced-fat, or light mayonnaise and salad dressings (reduced-sodium). Canola, safflower, olive, soybean, and sunflower oils. Avocado. Seasoning and other foods Herbs. Spices. Seasoning mixes without salt. Unsalted popcorn and pretzels. Fat-free sweets. What foods are not recommended? The items listed may not be a complete list. Talk with your dietitian about what dietary choices are best for you. Grains Baked goods made with fat, such as croissants, muffins, or some breads. Dry pasta or rice meal packs. Vegetables Creamed or fried vegetables. Vegetables in a cheese sauce. Regular canned vegetables (not low-sodium or reduced-sodium). Regular canned tomato sauce and paste (not low-sodium or reduced-sodium). Regular tomato and vegetable juice (not low-sodium or reduced-sodium). Angie Fava. Olives. Fruits Canned fruit in a light or heavy syrup. Fried fruit. Fruit in cream or butter sauce. Meat and other protein foods Fatty cuts of meat. Ribs. Fried meat. Berniece Salines. Sausage. Bologna and other processed lunch meats. Salami. Fatback. Hotdogs. Bratwurst. Salted nuts and seeds. Canned beans with added salt. Canned or smoked fish.  Whole eggs or egg yolks. Chicken or [place] with skin. Dairy Whole or 2% milk, cream, and half-and-half. Whole or full-fat cream cheese. Whole-fat or sweetened yogurt. Full-fat cheese. Nondairy creamers. Whipped toppings. Processed cheese and cheese spreads. Fats and oils Butter. Stick margarine. Lard. Shortening. Ghee. Bacon fat. Tropical oils, such as coconut, palm kernel, or palm oil. Seasoning and other foods Salted popcorn and pretzels. Onion salt, garlic salt, seasoned salt, table salt, and sea salt. Worcestershire sauce. Tartar sauce. Barbecue sauce. Teriyaki sauce. Soy sauce, including reduced-sodium. Steak sauce. Canned and packaged gravies. Fish sauce. Oyster sauce. Cocktail sauce. Horseradish that you find on the shelf. Ketchup. Mustard. Meat flavorings and tenderizers. Bouillon cubes. Hot sauce and Tabasco sauce. Premade or packaged marinades. Premade or packaged taco seasonings. Relishes. Regular salad dressings. Where to find more information:  National Heart, Lung, and [place]: https://wilson-eaton.com/  American Heart Association: www.heart.org Summary  The DASH eating plan is a healthy eating plan that has been shown to reduce high blood pressure (hypertension). It may also reduce your risk for type 2 diabetes, heart disease, and stroke.  With the DASH eating plan, you should limit salt (sodium) intake to 2,300 mg a day. If you have hypertension, you may need to reduce your sodium intake to 1,500 mg a day.  When  on the DASH eating plan, aim to eat more fresh fruits and vegetables, whole grains, lean proteins, low-fat dairy, and heart-healthy fats.  Work with your health care provider or diet and nutrition specialist (dietitian) to adjust your eating plan to your individual calorie needs. This information is not intended to replace advice given to you by your health care provider. Make sure you discuss any questions you have with your health care provider. Document Released:  07/21/2011 Document Revised: 07/14/2017 Document Reviewed: 07/25/2016 Elsevier Patient Education  2020 [employer].

## 2019-05-15 NOTE — Progress Notes (Signed)
NEUROLOGY CONSULTATION NOTE  Samantha Delacruz MRN: [id number] DOB: November 13, 1965  Referring provider: Lelon Frohlich, MD Primary care provider: Lelon Frohlich, MD  Reason for consult:  Traumatic brain injury  HISTORY OF PRESENT ILLNESS: Samantha Delacruz is a 53 year old right-handed female with ADHD, degenerative disc disease of cervical spine, migraines and ocular migraines who presents for evaluation following a traumatic brain injury.  History supplemented by referring provider note.  CT head from ED personally reviewed.  In October 2017, she tripped over a picnic table and hit the right side of her head on the concrete floor.  She says that she didn't lose consciousness.  About a week later, she developed intermittent diffuse headaches, photosensitivity, emotional lability with depression and suicidal ideation and felt like she was in a fog and fatigued.   She presented to the ED about 10 days later, on 06/10/2016 for further evaluation.  CT head revealed no acute or reversible intracranial abnormality.  She was diagnosed with postconcussion syndrome.  She noted difficulty with memories such as what past acquaintances looked like and realized she no longer enjoyed things such as chocolate ice cream.  Trouble with names and word-finding difficulties.  Symptoms persisted for about a year.  She says that from time to time, she still has both long term and short term memory. 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  She is able to perform her duties and ADLs.     She does have history of ADHD.  She reports that she previously was focused on caring for her sick ex-husband who has since passed away.  She now is trying to focus on her own health.  She now exercises, eats right and has lost weight.  She states she has "narcoleptic" tendencies such as easily falling asleep behind the wheel or while talking to somebody on the phone.  She states she sleeps well.   This has improved after she started taking better care of herself (diet, weight loss, exercise).  More recently, she has been feeling more anxious related to COVID and her blood pressure.  This past week, she went to the ED for high blood pressure (Q000111Q systolic), which was new.  She said for a second, it looked like her face was crooked but no other focal findings.    She does not recall prior history of head injury.  No recurrent head injury.    10/05/2018 LABS:  B12 271, TSH 1.93  PAST MEDICAL HISTORY: Past Medical History:  Diagnosis Date  . Adult ADHD   . Anemia    history of  . Asthma   . Headache    Migraines  . Heart palpitations   . Hemorrhoids, internal, with bleeding and mucous discharge - Grade 1 02/05/2014  . Migraines   . Pneumonia   . Restless leg   . Rosacea   . Shingles   . Vertigo     PAST SURGICAL HISTORY: Past Surgical History:  Procedure Laterality Date  . CESAREAN SECTION  08/15/92   son  . DILATATION & CURETTAGE/HYSTEROSCOPY WITH MYOSURE N/A 04/19/2016   Procedure: DILATATION & CURETTAGE/HYSTEROSCOPY WITH MYOSURE;  Surgeon: Anastasio Auerbach, MD;  Location: [facility];  Service: Gynecology;  Laterality: N/A;  request 7:30am start time   Requests one hour OR time.   . thrush      MEDICATIONS: Current Outpatient Medications on File Prior to Visit  Medication Sig Dispense Refill  . albuterol (PROAIR HFA) 108 (  90 Base) MCG/ACT inhaler INHALE 2 PUFFS EVERY 4 HOURS AS NEEDED FOR WHEEZING OR SHORTNESS OF BREATH (Patient taking differently: Inhale 2 puffs into the lungs every 4 (four) hours as needed for wheezing or shortness of breath. ) 8.5 Inhaler 2  . fluticasone (FLONASE) 50 MCG/ACT nasal spray USE 1 SPRAY IN EACH NOSTRIL DAILY 16 g 0  . fluticasone-salmeterol (ADVAIR HFA) 230-21 MCG/ACT inhaler TAKE 2 PUFFS BY MOUTH TWICE A DAY (Patient taking differently: Inhale 2 puffs into the lungs 2 (two) times daily. TAKE 2 PUFFS BY MOUTH TWICE A DAY) 24 Inhaler 0  .  hydrochlorothiazide (MICROZIDE) 12.5 MG capsule Take 1 capsule (12.5 mg total) by mouth daily. 90 capsule 0  . M-DRYL 12.5 MG/5ML liquid Take 12.5 mg by mouth daily.    . magic mouthwash w/lidocaine SOLN Take 5 mLs by mouth 3 (three) times daily. Swish and spit. 180 mL 0  . Multiple Vitamin (MULTIVITAMIN) tablet Take 1 tablet by mouth daily.     No current facility-administered medications on file prior to visit.     ALLERGIES: Allergies  Allergen Reactions  . Latex Rash  . Flu Virus Vaccine Nausea And Vomiting  . Codeine Hives  . Lactose Intolerance (Gi) Diarrhea    cramping  . Mobic [Meloxicam]   . Sulfa Antibiotics Hives    FAMILY HISTORY: Family History  Problem Relation Age of Onset  . Depression Mother   . Diabetes Mother   . Emphysema Mother   . Stroke Mother   . Cancer Mother        uterine   . Heart disease Father   . Heart failure Father   . Thyroid disease Sister   . Thyroid disease Brother   . ADD / ADHD Son   . Stroke Maternal Grandmother   . Cancer Paternal Grandfather        esophgus   . Colon cancer Neg Hx     SOCIAL HISTORY: Social History   Socioeconomic History  . Marital status: Legally Separated    Spouse name: Not on file  . Number of children: 1  . Years of education: Not on file  . Highest education level: Not on file  Occupational History    Employer: [employer]  Social Needs  . Financial resource strain: Not on file  . Food insecurity    Worry: Not on file    Inability: Not on file  . Transportation needs    Medical: Not on file    Non-medical: Not on file  Tobacco Use  . Smoking status: Never Smoker  . Smokeless tobacco: Never Used  Substance and Sexual Activity  . Alcohol use: Yes    Alcohol/week: 0.0 standard drinks    Comment: occ beer  . Drug use: No  . Sexual activity: Not Currently    Birth control/protection: Other-see comments    Comment: 1st intercourse 53 yo-Fewer than 5 partners  Lifestyle  .  Physical activity    Days per week: Not on file    Minutes per session: Not on file  . Stress: Not on file  Relationships  . Social Herbalist on phone: Not on file    Gets together: Not on file    Attends religious service: Not on file    Active member of club or organization: Not on file    Attends meetings of clubs or organizations: Not on file    Relationship status: Not on file  . Intimate partner  violence    Fear of current or ex partner: Not on file    Emotionally abused: Not on file    Physically abused: Not on file    Forced sexual activity: Not on file  Other Topics Concern  . 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   No caffeine          REVIEW OF SYSTEMS: Constitutional: No fevers, chills, or sweats, no generalized fatigue, change in appetite Eyes: No visual changes, double vision, eye pain Ear, nose and throat: No hearing loss, ear pain, nasal congestion, sore throat Cardiovascular: No chest pain, palpitations Respiratory:  No shortness of breath at rest or with exertion, wheezes GastrointestinaI: No nausea, vomiting, diarrhea, abdominal pain, fecal incontinence Genitourinary:  No dysuria, urinary retention or frequency Musculoskeletal:  No neck pain, back pain Integumentary: No rash, pruritus, skin lesions Neurological: as above Psychiatric: No depression, insomnia, anxiety Endocrine: No palpitations, fatigue, diaphoresis, mood swings, change in appetite, change in weight, increased thirst Hematologic/Lymphatic:  No purpura, petechiae. Allergic/Immunologic: no itchy/runny eyes, nasal congestion, recent allergic reactions, rashes  PHYSICAL EXAM: Blood pressure 122/80, pulse 70, temperature 98.2 F (36.8 C), resp. rate 12, height 5\' 5"  (1.651 m), weight 150 lb (68 kg), last menstrual period 07/01/2017, SpO2 98 %. General:  No acute distress.  Patient appears well-groomed.   Head:  Normocephalic/atraumatic Eyes:  fundi examined but not visualized Neck: supple, no paraspinal tenderness, full range of motion Back: No paraspinal tenderness Heart: regular rate and rhythm Lungs: Clear to auscultation bilaterally. Vascular: No carotid bruits. Neurological Exam: Mental status: alert and oriented to person, place, and time, recent and remote memory intact, fund of knowledge intact, attention and concentration intact, speech fluent and not dysarthric, language intact. Cranial nerves: CN I: not tested CN II: pupils equal, round and reactive to light, visual fields intact CN III, IV, VI:  full range of motion, no nystagmus, no ptosis CN V: facial sensation intact CN VII: upper and lower face symmetric CN VIII: hearing intact CN IX, X: gag intact, uvula midline CN XI: sternocleidomastoid and trapezius muscles intact CN XII: tongue midline Bulk & Tone: normal, no fasciculations. Motor:  5/5 throughout  Sensation:  temperature and vibration sensation intact.  Deep Tendon Reflexes:  2+ throughout, toes downgoing.   Finger to nose testing:  Without dysmetria.   Heel to shin:  Without dysmetria.   Gait:  Normal station and stride.  Able to turn and tandem walk. Romberg negative.  IMPRESSION: 1.  History of concussion 2.  Excessive daytime somnolence 3.  Memory issues  I don't appreciate any residual symptoms of concussion.  She endorses daytime somnolence and problems with concentration.  I think she should be evaluated for OSA which may contribute to these symptoms as well as elevated blood pressure.  She had a low-normal B12 in February, which may also present with these symptoms as well.  Her symptoms may also be related to her ADHD and she also appears to have anxiety as well.  PLAN: 1.  Check B12 level.  If less than 300, I would consider taking OTC B12 1023mcg daily to see if it helps her symptoms. 2.  Refer  to sleep medicine for evaluation of OSA 3.  Follow up as needed.  Thank you for allowing me to take part in the care of this patient.  Metta Clines, DO  CC: Lelon Frohlich, MD

## 2019-05-17 ENCOUNTER — Other Ambulatory Visit (INDEPENDENT_AMBULATORY_CARE_PROVIDER_SITE_OTHER): Payer: BC Managed Care – PPO

## 2019-05-17 ENCOUNTER — Ambulatory Visit: Payer: BC Managed Care – PPO | Admitting: Neurology

## 2019-05-17 ENCOUNTER — Other Ambulatory Visit: Payer: Self-pay

## 2019-05-17 ENCOUNTER — Encounter: Payer: Self-pay | Admitting: Neurology

## 2019-05-17 ENCOUNTER — Other Ambulatory Visit: Payer: Self-pay | Admitting: *Deleted

## 2019-05-17 VITALS — BP 122/80 | HR 70 | Temp 98.2°F | Resp 12 | Ht 65.0 in | Wt 150.0 lb

## 2019-05-17 DIAGNOSIS — E538 Deficiency of other specified B group vitamins: Secondary | ICD-10-CM

## 2019-05-17 DIAGNOSIS — Z87820 Personal history of traumatic brain injury: Secondary | ICD-10-CM | POA: Diagnosis not present

## 2019-05-17 DIAGNOSIS — R400 Somnolence: Secondary | ICD-10-CM | POA: Diagnosis not present

## 2019-05-17 DIAGNOSIS — F419 Anxiety disorder, unspecified: Secondary | ICD-10-CM

## 2019-05-17 NOTE — Patient Instructions (Signed)
I don't appreciate any residual symptoms of concussion I would like to repeat B12 level.  In February, it was in the low-normal range which may still present with symptoms of deficiency such as memory and fatigue issues.  I also will refer you to sleep medicine for evaluation of sleep apnea which also may contribute to fatigue, memory issues and high blood pressure  Follow up as needed.

## 2019-05-18 LAB — VITAMIN B12: Vitamin B-12: 496 pg/mL (ref 200–1100)

## 2019-05-21 ENCOUNTER — Telehealth: Payer: Self-pay | Admitting: *Deleted

## 2019-05-21 NOTE — Telephone Encounter (Addendum)
Unable to leave message to relay below info - will call back.   ----- Message from Pieter Partridge, DO sent at 05/20/2019  7:35 AM EDT ----- B12 looks okay

## 2019-05-22 ENCOUNTER — Telehealth: Payer: Self-pay | Admitting: *Deleted

## 2019-05-22 NOTE — Telephone Encounter (Signed)
Called patient and informed her that B12 looks okay. Verbalizes understanding.  She asked about the sleep study DR. Jaffe mentioned at her virtual visit Oct.2. She said in the age of Covid she would rather delay this unless it is something that can be done at home.  Order was placed 10/2 ambulatory referral to pulmonology in [place]. Informed patient they should be calling her as internal order entered and if they have not in a few weeks she is welcome to call us back. Verbalized understanding.

## 2019-05-22 NOTE — Telephone Encounter (Signed)
-----   Message from Pieter Partridge, DO sent at 05/20/2019  7:35 AM EDT ----- B12 looks okay

## 2019-06-03 ENCOUNTER — Telehealth: Payer: Self-pay | Admitting: *Deleted

## 2019-06-03 NOTE — Telephone Encounter (Signed)
Per Kayla/Credentialing this happens sometimes during open enrollment where all providers are not loaded into the directory. Dr. Jerilee Hoh IS credentialed with [facility].   Spoke with pt and advised she may need to wait a few weeks and try again. Asked her to please let us know if she continues to have problems. Pt voiced understanding. Nothing further needed at this time.

## 2019-06-03 NOTE — Telephone Encounter (Signed)
Copied from [place] [id number]. Topic: Quick Communication - See Telephone Encounter >> Jun 03, 2019 10:57 AM Loma Boston wrote: CRM for notification. See Telephone encounter for: 06/03/19. Pt that called this am and I talked with Archie Patten states that Dr Jerilee Hoh is not even showing up on line as a choice for [facility] she said

## 2019-06-10 ENCOUNTER — Other Ambulatory Visit: Payer: Self-pay | Admitting: Women's Health

## 2019-06-10 ENCOUNTER — Telehealth: Payer: Self-pay | Admitting: *Deleted

## 2019-06-10 NOTE — Telephone Encounter (Signed)
TC to answer questions: States mental thrush is better, questions if she has pinworms, reviewed best to follow-up with primary care for diagnosis.

## 2019-06-10 NOTE — Telephone Encounter (Signed)
Patient called and left message to speak with you directly regarding on going issues with "thrust" in her mouth. Patient has PCP would you like me to have her see treatment with PCP? [phone] Please advise

## 2019-06-25 ENCOUNTER — Telehealth: Payer: Self-pay | Admitting: *Deleted

## 2019-06-25 DIAGNOSIS — R062 Wheezing: Secondary | ICD-10-CM

## 2019-06-25 DIAGNOSIS — J4520 Mild intermittent asthma, uncomplicated: Secondary | ICD-10-CM

## 2019-06-25 NOTE — Telephone Encounter (Signed)
Referral placed.

## 2019-06-25 NOTE — Telephone Encounter (Signed)
Okay to refer? 

## 2019-06-25 NOTE — Telephone Encounter (Signed)
Copied from [place] [id number]. Topic: Referral - Request for Referral >> Jun 25, 2019  1:07 PM Scherrie Gerlach wrote: Pt would like another referral to [facility] asap. She never answered her phone when they called so they closed referral. Can you let her know when it is done so she can call them.  She does not like answering calls she does not know.

## 2019-06-25 NOTE — Telephone Encounter (Signed)
Yes

## 2019-07-08 ENCOUNTER — Other Ambulatory Visit: Payer: Self-pay

## 2019-07-08 ENCOUNTER — Encounter: Payer: Self-pay | Admitting: Internal Medicine

## 2019-07-09 ENCOUNTER — Encounter: Payer: Self-pay | Admitting: Internal Medicine

## 2019-07-09 ENCOUNTER — Ambulatory Visit: Payer: BC Managed Care – PPO | Admitting: Internal Medicine

## 2019-07-09 VITALS — BP 130/80 | HR 85 | Temp 97.4°F | Wt 159.9 lb

## 2019-07-09 DIAGNOSIS — I10 Essential (primary) hypertension: Secondary | ICD-10-CM | POA: Diagnosis not present

## 2019-07-09 LAB — BASIC METABOLIC PANEL
BUN: 24 mg/dL — ABNORMAL HIGH (ref 6–23)
CO2: 28 mEq/L (ref 19–32)
Calcium: 9.2 mg/dL (ref 8.4–10.5)
Chloride: 103 mEq/L (ref 96–112)
Creatinine, Ser: 0.83 mg/dL (ref 0.40–1.20)
GFR: 71.46 mL/min (ref >60.00)
Glucose, Bld: 88 mg/dL (ref 70–99)
Potassium: 4.1 mEq/L (ref 3.5–5.1)
Sodium: 138 mEq/L (ref 135–145)

## 2019-07-09 NOTE — Progress Notes (Signed)
Established Patient Office Visit     This visit occurred during the SARS-CoV-2 public health emergency.  Safety protocols were in place, including screening questions prior to the visit, additional usage of staff PPE, and extensive cleaning of exam room while observing appropriate contact time as indicated for disinfecting solutions.    CC/Reason for Visit: Blood pressure follow-up  HPI: Samantha Delacruz is a 53 y.o. female who is coming in today for the above mentioned reasons. Past Medical History is significant for: Hypertension.  End of September she was started on 12.5 mg of hydrochlorothiazide she brings in her blood pressure measurements.  They are all in the [id number] to Q000111Q systolics and Q000111Q diastolic.  She has no complaints today.  She states that with improvement of her blood pressure the frequency of her migraine headaches has improved.   Past Medical/Surgical History: Past Medical History:  Diagnosis Date  . Adult ADHD   . Anemia    history of  . Asthma   . Headache    Migraines  . Heart palpitations   . Hemorrhoids, internal, with bleeding and mucous discharge - Grade 1 02/05/2014  . Migraines   . Pneumonia   . Restless leg   . Rosacea   . Shingles   . Vertigo     Past Surgical History:  Procedure Laterality Date  . CESAREAN SECTION  08/15/92   son  . DILATATION & CURETTAGE/HYSTEROSCOPY WITH MYOSURE N/A 04/19/2016   Procedure: DILATATION & CURETTAGE/HYSTEROSCOPY WITH MYOSURE;  Surgeon: Anastasio Auerbach, MD;  Location: [facility];  Service: Gynecology;  Laterality: N/A;  request 7:30am start time   Requests one hour OR time.   . thrush      Social History:  reports that she has never smoked. She has never used smokeless tobacco. She reports current alcohol use. She reports that she does not use drugs.  Allergies: Allergies  Allergen Reactions  . Latex Rash  . Flu Virus Vaccine Nausea And Vomiting  . Codeine Hives  . Lactose Intolerance (Gi) Diarrhea     cramping  . Mobic [Meloxicam]   . Sulfa Antibiotics Hives    Family History:  Family History  Problem Relation Age of Onset  . Depression Mother   . Diabetes Mother   . Emphysema Mother   . Stroke Mother   . Cancer Mother        uterine   . Heart disease Father   . Heart failure Father   . Thyroid disease Sister   . Thyroid disease Brother   . ADD / ADHD Son   . Stroke Maternal Grandmother   . Cancer Paternal Grandfather        esophgus   . Colon cancer Neg Hx      Current Outpatient Medications:  .  albuterol (PROAIR HFA) 108 (90 Base) MCG/ACT inhaler, INHALE 2 PUFFS EVERY 4 HOURS AS NEEDED FOR WHEEZING OR SHORTNESS OF BREATH (Patient taking differently: Inhale 2 puffs into the lungs every 4 (four) hours as needed for wheezing or shortness of breath. ), Disp: 8.5 Inhaler, Rfl: 2 .  fluticasone (FLONASE) 50 MCG/ACT nasal spray, USE 1 SPRAY IN EACH NOSTRIL DAILY, Disp: 16 g, Rfl: 0 .  fluticasone-salmeterol (ADVAIR HFA) 230-21 MCG/ACT inhaler, TAKE 2 PUFFS BY MOUTH TWICE A DAY (Patient taking differently: Inhale 2 puffs into the lungs 2 (two) times daily. TAKE 2 PUFFS BY MOUTH TWICE A DAY), Disp: 24 Inhaler, Rfl: 0 .  hydrochlorothiazide (MICROZIDE) 12.5 MG  capsule, Take 1 capsule (12.5 mg total) by mouth daily., Disp: 90 capsule, Rfl: 0 .  magic mouthwash w/lidocaine SOLN, Take 5 mLs by mouth 3 (three) times daily. Swish and spit., Disp: 180 mL, Rfl: 0 .  Multiple Vitamin (MULTIVITAMIN) tablet, Take 1 tablet by mouth daily., Disp: , Rfl:  .  OVER THE COUNTER MEDICATION, Pre biotic, Disp: , Rfl:  .  Probiotic Product (PROBIOTIC ADVANCED PO), Take by mouth., Disp: , Rfl:  .  UNABLE TO FIND, Med Name: apple cider capsule, Disp: , Rfl:   Review of Systems:  Constitutional: Denies fever, chills, diaphoresis, appetite change and fatigue.  HEENT: Denies photophobia, eye pain, redness, hearing loss, ear pain, congestion, sore throat, rhinorrhea, sneezing, mouth sores, trouble  swallowing, neck pain, neck stiffness and tinnitus.   Respiratory: Denies SOB, DOE, cough, chest tightness,  and wheezing.   Cardiovascular: Denies chest pain, palpitations and leg swelling.  Gastrointestinal: Denies nausea, vomiting, abdominal pain, diarrhea, constipation, blood in stool and abdominal distention.  Genitourinary: Denies dysuria, urgency, frequency, hematuria, flank pain and difficulty urinating.  Endocrine: Denies: hot or cold intolerance, sweats, changes in hair or nails, polyuria, polydipsia. Musculoskeletal: Denies myalgias, back pain, joint swelling, arthralgias and gait problem.  Skin: Denies pallor, rash and wound.  Neurological: Denies dizziness, seizures, syncope, weakness, light-headedness, numbness and headaches.  Hematological: Denies adenopathy. Easy bruising, personal or family bleeding history  Psychiatric/Behavioral: Denies suicidal ideation, mood changes, confusion, nervousness, sleep disturbance and agitation    Physical Exam: Vitals:   07/09/19 0745  BP: 130/80  Pulse: 85  Temp: (!) 97.4 F (36.3 C)  TempSrc: Temporal  SpO2: 97%  Weight: 159 lb 14.4 oz (72.5 kg)    Body mass index is 26.61 kg/m.   Constitutional: NAD, calm, comfortable Eyes: PERRL, lids and conjunctivae normal ENMT: Mucous membranes are moist.  Respiratory: clear to auscultation bilaterally, no wheezing, no crackles. Normal respiratory effort. No accessory muscle use.  Cardiovascular: Regular rate and rhythm, no murmurs / rubs / gallops. No extremity edema. 2+ pedal pulses.  Psychiatric: Normal judgment and insight. Alert and oriented x 3. Normal mood.    Impression and Plan:  Essential hypertension  -Well-controlled on 12.5 mg of hydrochlorothiazide. -Check basic metabolic panel today for renal function and electrolytes.    Patient Instructions  -Nice seeing you today!!  -Lab work today; will notify you once results are available.  -Blood pressure is looking  great!  -Schedule follow up in 3-4 months.     Lelon Frohlich, MD [facility]

## 2019-07-09 NOTE — Patient Instructions (Signed)
-  Nice seeing you today!!  -Lab work today; will notify you once results are available.  -Blood pressure is looking great!  -Schedule follow up in 3-4 months.

## 2019-07-09 NOTE — Telephone Encounter (Signed)
Patient here for an office visit 07/09/2019

## 2019-07-09 NOTE — Addendum Note (Signed)
Addended by: Suzette Battiest on: 07/09/2019 08:56 AM   Modules accepted: Orders

## 2019-08-05 ENCOUNTER — Other Ambulatory Visit: Payer: Self-pay | Admitting: Internal Medicine

## 2019-08-05 DIAGNOSIS — I10 Essential (primary) hypertension: Secondary | ICD-10-CM

## 2019-10-18 ENCOUNTER — Telehealth: Payer: Self-pay | Admitting: Internal Medicine

## 2019-10-18 NOTE — Telephone Encounter (Signed)
Pt would like to speak to you regarding possibly getting started on anxiety medication and pt would like to know which of the two COVID vaccines, Dr. Jerilee Hoh, would recommend for her to receive with her health issues. Thanks

## 2019-10-22 NOTE — Telephone Encounter (Signed)
Office/virual visit?

## 2019-10-22 NOTE — Telephone Encounter (Signed)
Spoke with patient and an appointment scheduled 

## 2019-10-25 ENCOUNTER — Telehealth (INDEPENDENT_AMBULATORY_CARE_PROVIDER_SITE_OTHER): Payer: BC Managed Care – PPO | Admitting: Adult Health

## 2019-10-25 ENCOUNTER — Encounter: Payer: Self-pay | Admitting: Adult Health

## 2019-10-25 DIAGNOSIS — F419 Anxiety disorder, unspecified: Secondary | ICD-10-CM | POA: Diagnosis not present

## 2019-10-25 MED ORDER — HYDROXYZINE HCL 50 MG PO TABS: 25.0000 mg | ORAL_TABLET | Freq: Three times a day (TID) | ORAL | 0 refills | Status: DC | PRN
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
## 2019-11-06 ENCOUNTER — Other Ambulatory Visit: Payer: Self-pay | Admitting: Internal Medicine

## 2019-11-06 DIAGNOSIS — I10 Essential (primary) hypertension: Secondary | ICD-10-CM

## 2019-12-03 ENCOUNTER — Other Ambulatory Visit: Payer: Self-pay

## 2019-12-04 ENCOUNTER — Encounter: Payer: Self-pay | Admitting: Women's Health

## 2019-12-04 ENCOUNTER — Ambulatory Visit: Payer: BC Managed Care – PPO | Admitting: Women's Health

## 2019-12-04 VITALS — BP 122/78 | Ht 65.0 in | Wt 162.0 lb

## 2019-12-04 DIAGNOSIS — Z01419 Encounter for gynecological examination (general) (routine) without abnormal findings: Secondary | ICD-10-CM | POA: Diagnosis not present

## 2019-12-04 DIAGNOSIS — Z13820 Encounter for screening for osteoporosis: Secondary | ICD-10-CM

## 2019-12-04 NOTE — Patient Instructions (Signed)
Vit D 2000 iu daily Pleasure knowing you! Health Maintenance for Postmenopausal Women Menopause is a normal process in which your ability to get pregnant comes to an end. This process happens slowly over many months or years, usually between the ages of 39 and 79. Menopause is complete when you have missed your menstrual periods for 12 months. It is important to talk with your health care provider about some of the most common conditions that affect women after menopause (postmenopausal women). These include heart disease, cancer, and bone loss (osteoporosis). Adopting a healthy lifestyle and getting preventive care can help to promote your health and wellness. The actions you take can also lower your chances of developing some of these common conditions. What should I know about menopause? During menopause, you may get a number of symptoms, such as:  Hot flashes. These can be moderate or severe.  Night sweats.  Decrease in sex drive.  Mood swings.  Headaches.  Tiredness.  Irritability.  Memory problems.  Insomnia. Choosing to treat or not to treat these symptoms is a decision that you make with your health care provider. Do I need hormone replacement therapy?  Hormone replacement therapy is effective in treating symptoms that are caused by menopause, such as hot flashes and night sweats.  Hormone replacement carries certain risks, especially as you become older. If you are thinking about using estrogen or estrogen with progestin, discuss the benefits and risks with your health care provider. What is my risk for heart disease and stroke? The risk of heart disease, heart attack, and stroke increases as you age. One of the causes may be a change in the body's hormones during menopause. This can affect how your body uses dietary fats, triglycerides, and cholesterol. Heart attack and stroke are medical emergencies. There are many things that you can do to help prevent heart disease and  stroke. Watch your blood pressure  High blood pressure causes heart disease and increases the risk of stroke. This is more likely to develop in people who have high blood pressure readings, are of African descent, or are overweight.  Have your blood pressure checked: ? Every 3-5 years if you are 59-23 years of age. ? Every year if you are 71 years old or older. Eat a healthy diet   Eat a diet that includes plenty of vegetables, fruits, low-fat dairy products, and lean protein.  Do not eat a lot of foods that are high in solid fats, added sugars, or sodium. Get regular exercise Get regular exercise. This is one of the most important things you can do for your health. Most adults should:  Try to exercise for at least 150 minutes each week. The exercise should increase your heart rate and make you sweat (moderate-intensity exercise).  Try to do strengthening exercises at least twice each week. Do these in addition to the moderate-intensity exercise.  Spend less time sitting. Even light physical activity can be beneficial. Other tips  Work with your health care provider to achieve or maintain a healthy weight.  Do not use any products that contain nicotine or tobacco, such as cigarettes, e-cigarettes, and chewing tobacco. If you need help quitting, ask your health care provider.  Know your numbers. Ask your health care provider to check your cholesterol and your blood sugar (glucose). Continue to have your blood tested as directed by your health care provider. Do I need screening for cancer? Depending on your health history and family history, you may need to have cancer  screening at different stages of your life. This may include screening for:  Breast cancer.  Cervical cancer.  Lung cancer.  Colorectal cancer. What is my risk for osteoporosis? After menopause, you may be at increased risk for osteoporosis. Osteoporosis is a condition in which bone destruction happens more  quickly than new bone creation. To help prevent osteoporosis or the bone fractures that can happen because of osteoporosis, you may take the following actions:  If you are 43-36 years old, get at least 1,000 mg of calcium and at least 600 mg of vitamin D per day.  If you are older than age 5 but younger than age 47, get at least 1,200 mg of calcium and at least 600 mg of vitamin D per day.  If you are older than age 32, get at least 1,200 mg of calcium and at least 800 mg of vitamin D per day. Smoking and drinking excessive alcohol increase the risk of osteoporosis. Eat foods that are rich in calcium and vitamin D, and do weight-bearing exercises several times each week as directed by your health care provider. How does menopause affect my mental health? Depression may occur at any age, but it is more common as you become older. Common symptoms of depression include:  Low or sad mood.  Changes in sleep patterns.  Changes in appetite or eating patterns.  Feeling an overall lack of motivation or enjoyment of activities that you previously enjoyed.  Frequent crying spells. Talk with your health care provider if you think that you are experiencing depression. General instructions See your health care provider for regular wellness exams and vaccines. This may include:  Scheduling regular health, dental, and eye exams.  Getting and maintaining your vaccines. These include: ? Influenza vaccine. Get this vaccine each year before the flu season begins. ? Pneumonia vaccine. ? Shingles vaccine. ? Tetanus, diphtheria, and pertussis (Tdap) booster vaccine. Your health care provider may also recommend other immunizations. Tell your health care provider if you have ever been abused or do not feel safe at home. Summary  Menopause is a normal process in which your ability to get pregnant comes to an end.  This condition causes hot flashes, night sweats, decreased interest in sex, mood swings,  headaches, or lack of sleep.  Treatment for this condition may include hormone replacement therapy.  Take actions to keep yourself healthy, including exercising regularly, eating a healthy diet, watching your weight, and checking your blood pressure and blood sugar levels.  Get screened for cancer and depression. Make sure that you are up to date with all your vaccines. This information is not intended to replace advice given to you by your health care provider. Make sure you discuss any questions you have with your health care provider. Document Revised: 07/25/2018 Document Reviewed: 07/25/2018 Elsevier Patient Education  2020 [employer].

## 2019-12-04 NOTE — Progress Notes (Signed)
   Samantha Delacruz 1965/10/01 [id number]   History:  54 y.o. presents for annual exam.  Postmenopausal on no HRT with no bleeding.  Not sexually active.  Negative STD screen 2020.  Normal Pap and mammogram history.  2016 - colonoscopy.  Has had Shingrix and Covid vaccine.  Primary care manages hypertension and asthma.  History of ablation.  Past medical history, past surgical history, family history and social history were all reviewed and documented in the EPIC chart.  First grade teacher.  Parents deceased, mother uterine cancer, diabetes and stroke, father heart failure.  26 year old son planning marriage this year.  History of migraines without aura and superficial thrombophlebitis.   ROS:  A ROS was performed and pertinent positives and negatives are included.  Exam:  Vitals:   12/04/19 1516  BP: 122/78  Weight: 162 lb (73.5 kg)  Height: 5\' 5"  (1.651 m)   Body mass index is 26.96 kg/m.  General appearance:  Normal Thyroid:  Symmetrical, normal in size, without palpable masses or nodularity. Respiratory  Auscultation:  Clear without wheezing or rhonchi Cardiovascular  Auscultation:  Regular rate, without rubs, murmurs or gallops  Edema/varicosities:  Not grossly evident Abdominal  Soft,nontender, without masses, guarding or rebound.  Liver/spleen:  No organomegaly noted  Hernia:  None appreciated  Skin  Inspection:  Grossly normal   Breasts: Examined lying and sitting.   Right: Without masses, retractions, discharge or axillary adenopathy.   Left: Without masses, retractions, discharge or axillary adenopathy. Gentitourinary   Inguinal/mons:  Normal without inguinal adenopathy  External genitalia:  Normal  BUS/Urethra/Skene's glands:  Normal  Vagina:  Normal  Cervix:  Normal  Uterus:  normal in size, shape and contour.  Midline and mobile  Adnexa/parametria:     Rt: Without masses or tenderness.   Lt: Without masses or tenderness.  Anus and  perineum: Normal  Digital rectal exam: Normal sphincter tone without palpated masses or tenderness  Assessment/Plan:  54 y.o. D WF G1, P1 for annual exam with no complaints of vaginal discharge, urinary symptoms, or abdominal pain.  Postmenopausal no HRT with no bleeding Hypertension and asthma -primary care manages labs and meds History of superficial thrombophlebitis  Plan: DEXA, states has had several traumatic falls with running with no fractures.  Encouraged to continue active lifestyle with regular weightbearing exercise balance type exercise encouraged.  SBEs, continue annual screening mammogram, calcium rich foods, and vitamin D 2000 IUs daily encouraged.  Pap normal 2020, new screening guidelines reviewed.   [place], 5:09 PM 12/04/2019

## 2020-02-03 ENCOUNTER — Other Ambulatory Visit: Payer: Self-pay | Admitting: Internal Medicine

## 2020-02-03 DIAGNOSIS — I10 Essential (primary) hypertension: Secondary | ICD-10-CM

## 2020-04-14 ENCOUNTER — Other Ambulatory Visit: Payer: Self-pay | Admitting: Women's Health

## 2020-04-14 ENCOUNTER — Ambulatory Visit (INDEPENDENT_AMBULATORY_CARE_PROVIDER_SITE_OTHER): Payer: BC Managed Care – PPO

## 2020-04-14 ENCOUNTER — Other Ambulatory Visit: Payer: Self-pay

## 2020-04-14 DIAGNOSIS — Z780 Asymptomatic menopausal state: Secondary | ICD-10-CM

## 2020-04-14 DIAGNOSIS — Z13820 Encounter for screening for osteoporosis: Secondary | ICD-10-CM

## 2020-04-21 ENCOUNTER — Other Ambulatory Visit: Payer: Self-pay | Admitting: Internal Medicine

## 2020-04-21 DIAGNOSIS — Z1231 Encounter for screening mammogram for malignant neoplasm of breast: Secondary | ICD-10-CM

## 2020-04-29 ENCOUNTER — Other Ambulatory Visit: Payer: Self-pay | Admitting: Internal Medicine

## 2020-04-29 DIAGNOSIS — I10 Essential (primary) hypertension: Secondary | ICD-10-CM

## 2020-05-08 ENCOUNTER — Ambulatory Visit
Admission: RE | Admit: 2020-05-08 | Discharge: 2020-05-08 | Disposition: A | Discharge status: Home / Self Care | Payer: BC Managed Care – PPO | Source: Ambulatory Visit | Attending: Internal Medicine | Admitting: Internal Medicine

## 2020-05-08 ENCOUNTER — Other Ambulatory Visit: Payer: Self-pay

## 2020-05-08 DIAGNOSIS — Z1231 Encounter for screening mammogram for malignant neoplasm of breast: Secondary | ICD-10-CM

## 2020-05-13 ENCOUNTER — Other Ambulatory Visit: Payer: Self-pay | Admitting: Internal Medicine

## 2020-05-13 DIAGNOSIS — R928 Other abnormal and inconclusive findings on diagnostic imaging of breast: Secondary | ICD-10-CM

## 2020-05-19 ENCOUNTER — Other Ambulatory Visit: Payer: Self-pay

## 2020-05-19 ENCOUNTER — Encounter: Payer: Self-pay | Admitting: Internal Medicine

## 2020-05-19 ENCOUNTER — Telehealth: Payer: Self-pay | Admitting: Internal Medicine

## 2020-05-19 ENCOUNTER — Ambulatory Visit (INDEPENDENT_AMBULATORY_CARE_PROVIDER_SITE_OTHER): Payer: BC Managed Care – PPO | Admitting: Internal Medicine

## 2020-05-19 VITALS — BP 140/90 | HR 103 | Temp 98.3°F | Wt 173.4 lb

## 2020-05-19 DIAGNOSIS — R300 Dysuria: Secondary | ICD-10-CM

## 2020-05-19 DIAGNOSIS — M5441 Lumbago with sciatica, right side: Secondary | ICD-10-CM

## 2020-05-19 LAB — POCT URINALYSIS DIPSTICK
Bilirubin, UA: NEGATIVE
Glucose, UA: NEGATIVE
Ketones, UA: NEGATIVE
Nitrite, UA: NEGATIVE
Protein, UA: NEGATIVE
Spec Grav, UA: 1.01 (ref 1.010–1.025)
Urobilinogen, UA: 0.2 E.U./dL
pH, UA: 6.5 (ref 5.0–8.0)

## 2020-05-19 NOTE — Addendum Note (Signed)
Addended by: Westley Hummer B on: 05/19/2020 08:10 AM   Modules accepted: Orders

## 2020-05-19 NOTE — Telephone Encounter (Signed)
Pt needs to discuss her urine sample she brought in today.  She has a question.

## 2020-05-19 NOTE — Progress Notes (Signed)
Established Patient Office Visit     This visit occurred during the SARS-CoV-2 public health emergency.  Safety protocols were in place, including screening questions prior to the visit, additional usage of staff PPE, and extensive cleaning of exam room while observing appropriate contact time as indicated for disinfecting solutions.    CC/Reason for Visit: Discuss some acute concerns.  HPI: Samantha Delacruz is a 54 y.o. female who is coming in today for the above mentioned reasons. She is here today to discuss some acute issues:  1. She has been having left-sided flank pain and mild dysuria for a few days. She states that a few days ago she noticed a very orange-colored urine but that has since resolved.  2. She has been having right lower back pain that radiates down the posterior lateral thigh sometimes all the way into her toes. This bothers her the most after going up and down the steps at work multiple times a day.  3. She was recalled from her screening mammogram due to a left breast mass and a right asymmetry. This has her anxious.   Past Medical/Surgical History: Past Medical History:  Diagnosis Date  . Adult ADHD   . Anemia    history of  . Asthma   . Headache    Migraines  . Heart palpitations   . Hemorrhoids, internal, with bleeding and mucous discharge - Grade 1 02/05/2014  . Migraines   . Pneumonia   . Restless leg   . Rosacea   . Shingles   . Vertigo     Past Surgical History:  Procedure Laterality Date  . CESAREAN SECTION  08/15/92   son  . DILATATION & CURETTAGE/HYSTEROSCOPY WITH MYOSURE N/A 04/19/2016   Procedure: DILATATION & CURETTAGE/HYSTEROSCOPY WITH MYOSURE;  Surgeon: Anastasio Auerbach, MD;  Location: [facility];  Service: Gynecology;  Laterality: N/A;  request 7:30am start time   Requests one hour OR time.   . thrush      Social History:  reports that she has never smoked. She has never used smokeless tobacco. She reports current alcohol  use. She reports that she does not use drugs.  Allergies: Allergies  Allergen Reactions  . Latex Rash  . Flu Virus Vaccine Nausea And Vomiting  . Codeine Hives  . Lactose Intolerance (Gi) Diarrhea    cramping  . Mobic [Meloxicam]   . Sulfa Antibiotics Hives    Family History:  Family History  Problem Relation Age of Onset  . Depression Mother   . Diabetes Mother   . Emphysema Mother   . Stroke Mother   . Cancer Mother        uterine   . Heart disease Father   . Heart failure Father   . Thyroid disease Sister   . Thyroid disease Brother   . ADD / ADHD Son   . Stroke Maternal Grandmother   . Cancer Paternal Grandfather        esophgus   . Colon cancer Neg Hx      Current Outpatient Medications:  .  albuterol (PROAIR HFA) 108 (90 Base) MCG/ACT inhaler, INHALE 2 PUFFS EVERY 4 HOURS AS NEEDED FOR WHEEZING OR SHORTNESS OF BREATH (Patient taking differently: Inhale 2 puffs into the lungs every 4 (four) hours as needed for wheezing or shortness of breath. ), Disp: 8.5 Inhaler, Rfl: 2 .  fluticasone (FLONASE) 50 MCG/ACT nasal spray, USE 1 SPRAY IN EACH NOSTRIL DAILY, Disp: 16 g, Rfl: 0 .  fluticasone-salmeterol (ADVAIR HFA) 230-21 MCG/ACT inhaler, TAKE 2 PUFFS BY MOUTH TWICE A DAY (Patient taking differently: Inhale 2 puffs into the lungs 2 (two) times daily. TAKE 2 PUFFS BY MOUTH TWICE A DAY), Disp: 24 Inhaler, Rfl: 0 .  hydrochlorothiazide (MICROZIDE) 12.5 MG capsule, TAKE 1 CAPSULE BY MOUTH EVERY DAY, Disp: 90 capsule, Rfl: 0 .  hydrOXYzine (ATARAX/VISTARIL) 50 MG tablet, Take 0.5-1 tablets (25-50 mg total) by mouth 3 (three) times daily as needed for anxiety., Disp: 15 tablet, Rfl: 0 .  magic mouthwash w/lidocaine SOLN, Take 5 mLs by mouth 3 (three) times daily. Swish and spit., Disp: 180 mL, Rfl: 0 .  Multiple Vitamin (MULTIVITAMIN) tablet, Take 1 tablet by mouth daily., Disp: , Rfl:  .  OVER THE COUNTER MEDICATION, Pre biotic, Disp: , Rfl:  .  Probiotic Product (PROBIOTIC  ADVANCED PO), Take by mouth., Disp: , Rfl:  .  UNABLE TO FIND, Med Name: apple cider capsule, Disp: , Rfl:   Review of Systems:  Constitutional: Denies fever, chills, diaphoresis, appetite change and fatigue.  HEENT: Denies photophobia, eye pain, redness, hearing loss, ear pain, congestion, sore throat, rhinorrhea, sneezing, mouth sores, trouble swallowing, neck pain, neck stiffness and tinnitus.   Respiratory: Denies SOB, DOE, cough, chest tightness,  and wheezing.   Cardiovascular: Denies chest pain, palpitations and leg swelling.  Gastrointestinal: Denies nausea, vomiting, abdominal pain, diarrhea, constipation, blood in stool and abdominal distention.  Genitourinary: Denies dysuria, urgency, frequency, hematuria, flank pain and difficulty urinating.  Endocrine: Denies: hot or cold intolerance, sweats, changes in hair or nails, polyuria, polydipsia. Musculoskeletal: Denies myalgias, back pain, joint swelling, arthralgias and gait problem.  Skin: Denies pallor, rash and wound.  Neurological: Denies dizziness, seizures, syncope, weakness, light-headedness, numbness and headaches.  Hematological: Denies adenopathy. Easy bruising, personal or family bleeding history  Psychiatric/Behavioral: Denies suicidal ideation, mood changes, confusion, nervousness, sleep disturbance and agitation    Physical Exam: Vitals:   05/19/20 0712  BP: 140/90  Pulse: (!) 103  Temp: 98.3 F (36.8 C)  TempSrc: Oral  SpO2: 99%  Weight: 173 lb 6.4 oz (78.7 kg)    Body mass index is 28.86 kg/m.   Constitutional: NAD, calm, comfortable Eyes: PERRL, lids and conjunctivae normal ENMT: Mucous membranes are moist.  Respiratory: clear to auscultation bilaterally, no wheezing, no crackles. Normal respiratory effort. No accessory muscle use.  Cardiovascular: Regular rate and rhythm, no murmurs / rubs / gallops. No extremity edema.  Neurologic: Grossly intact and nonfocal Psychiatric: Normal judgment and  insight. Alert and oriented x 3. Normal mood.    Impression and Plan:  Acute right-sided low back pain with right-sided sciatica -Advised icing, ibuprofen, back stretches, supportive footwear. -She will notify us if not better to consider referral to physical therapy.  Dysuria -She is unable to provide a sample today. I have given her a sterile cup and she will return later today with a sample for analysis, I suspect UTI based on symptoms.  Abnormal screening mammogram -She has follow-up appointment for diagnostic mammo and ultrasound.     Lelon Frohlich, MD [facility]

## 2020-05-19 NOTE — Addendum Note (Signed)
Addended by: Marrion Coy on: 05/19/2020 12:03 PM   Modules accepted: Orders

## 2020-05-20 ENCOUNTER — Ambulatory Visit
Admission: RE | Admit: 2020-05-20 | Discharge: 2020-05-20 | Disposition: A | Discharge status: Home / Self Care | Payer: BC Managed Care – PPO | Source: Ambulatory Visit | Attending: Internal Medicine | Admitting: Internal Medicine

## 2020-05-20 ENCOUNTER — Other Ambulatory Visit: Payer: Self-pay | Admitting: Internal Medicine

## 2020-05-20 DIAGNOSIS — R928 Other abnormal and inconclusive findings on diagnostic imaging of breast: Secondary | ICD-10-CM

## 2020-05-20 DIAGNOSIS — N6320 Unspecified lump in the left breast, unspecified quadrant: Secondary | ICD-10-CM

## 2020-05-20 LAB — URINALYSIS
Bilirubin Urine: NEGATIVE
Glucose, UA: NEGATIVE
Hgb urine dipstick: NEGATIVE
Ketones, ur: NEGATIVE
Nitrite: NEGATIVE
Protein, ur: NEGATIVE
Specific Gravity, Urine: 1.008 (ref 1.001–1.03)
pH: 6 (ref 5.0–8.0)

## 2020-05-20 LAB — URINE CULTURE
MICRO NUMBER:: 11033156
Result:: NO GROWTH
SPECIMEN QUALITY:: ADEQUATE

## 2020-05-20 NOTE — Telephone Encounter (Signed)
Pt is calling in to get her lab results from 05/19/2020 pt is aware that someone will call her when they have been resulted.

## 2020-05-25 NOTE — Telephone Encounter (Signed)
Pt is calling in to get her lab results and is aware that the provider has not resulted them, but when they have been resulted someone will give her a call.

## 2020-05-25 NOTE — Telephone Encounter (Signed)
Urine cx was negative.

## 2020-05-26 NOTE — Telephone Encounter (Signed)
Patient informed of the results.  Stated she was told she had blood in the urine and I offered to schedule an appt with the PCP.  Patient stated she will call back.

## 2020-07-08 ENCOUNTER — Ambulatory Visit: Payer: BC Managed Care – PPO | Admitting: Obstetrics and Gynecology

## 2020-07-08 ENCOUNTER — Other Ambulatory Visit: Payer: Self-pay

## 2020-07-08 ENCOUNTER — Encounter: Payer: Self-pay | Admitting: Obstetrics and Gynecology

## 2020-07-08 VITALS — BP 124/80

## 2020-07-08 DIAGNOSIS — R1032 Left lower quadrant pain: Secondary | ICD-10-CM | POA: Diagnosis not present

## 2020-07-08 DIAGNOSIS — R102 Pelvic and perineal pain: Secondary | ICD-10-CM

## 2020-07-08 NOTE — Progress Notes (Signed)
Samantha Delacruz 30-Mar-1966 [id number]  SUBJECTIVE:  54 y.o. G1P1 female presents for evaluation of a transient episode of abdominal and pelvic pain.  She says about a month ago she had a sudden onset of intermittent bilateral pelvic/abdominal pain, almost like she was going to "have a period."  She says she used to get this pain back when she was having periods years ago.  He says she had an ovarian cyst in the distant past and recalls feeling this way when she had that.  She has not had any vaginal bleeding in the last several years.  Her pain is currently resolved and she is not having any other symptoms of concern.  No vaginal discharge.  She does admit to eating more cheese in the last several weeks, she says she has a remote history of IBS but that have been better controlled.  She also thinks she might be a little bit sensitive to dairy as has caused her some bowel symptoms in the past.  She does not think she is overtly constipated but she also does not have a bowel movements on a daily basis.  The abdominal pelvic pain over the past 2 weeks had migrated to just the left side only.  She says the pain can be fairly excruciating when she gets it and it can last for several minutes but has not had any of this pain in the past several days.  No blood in the stool.  No melena.  Does have a history of hemorrhoids and had a little bit of rectal bleeding the other week but this resolved.  She says she is up-to-date on her colonoscopy.   Current Outpatient Medications  Medication Sig Dispense Refill  . albuterol (PROAIR HFA) 108 (90 Base) MCG/ACT inhaler INHALE 2 PUFFS EVERY 4 HOURS AS NEEDED FOR WHEEZING OR SHORTNESS OF BREATH (Patient taking differently: Inhale 2 puffs into the lungs every 4 (four) hours as needed for wheezing or shortness of breath. ) 8.5 Inhaler 2  . fluticasone (FLONASE) 50 MCG/ACT nasal spray USE 1 SPRAY IN EACH NOSTRIL DAILY 16 g 0  . fluticasone-salmeterol (ADVAIR HFA) 230-21  MCG/ACT inhaler TAKE 2 PUFFS BY MOUTH TWICE A DAY (Patient taking differently: Inhale 2 puffs into the lungs 2 (two) times daily. TAKE 2 PUFFS BY MOUTH TWICE A DAY) 24 Inhaler 0  . hydrochlorothiazide (MICROZIDE) 12.5 MG capsule TAKE 1 CAPSULE BY MOUTH EVERY DAY 90 capsule 0  . Multiple Vitamin (MULTIVITAMIN) tablet Take 1 tablet by mouth daily.    Marland Kitchen UNABLE TO FIND Med Name: apple cider capsule     No current facility-administered medications for this visit.   Allergies: Latex, Flu virus vaccine, Codeine, Lactose intolerance (gi), Mobic [meloxicam], and Sulfa antibiotics  Patient's last menstrual period was 07/01/2017.  Past medical history,surgical history, problem list, medications, allergies, family history and social history were all reviewed and documented as reviewed in the EPIC chart.  ROS:  Feeling well. No dyspnea or chest pain on exertion.  No current abdominal pain, +constipation, no black or bloody stools.  No urinary tract symptoms, +history of asymptomatic UTI (per patient), GYN ROS: normal menses, no abnormal bleeding, pelvic pain or discharge.   OBJECTIVE:  BP 124/80   LMP 07/01/2017  The patient appears well, alert, oriented, in no distress. PELVIC EXAM: VULVA: normal appearing vulva with atrophic changes, no masses, tenderness or lesions, VAGINA: normal appearing vagina with atrophic changes, normal color and discharge, no lesions, CERVIX: normal appearing cervix without  discharge or lesions, UTERUS: uterus is normal size, shape, consistency and nontender, ADNEXA: normal adnexa in size, nontender and no masses  Chaperone: Samantha Delacruz present during the examination  ASSESSMENT:  54 y.o. G1P1 here for evaluation of transient episode of left lower quadrant pain  PLAN:  UA performed today given the patient's reported history of asymptomatic UTI.  UA is unremarkable. Abdominal and pelvic exam are benign today and I do not have concern for an ovarian cyst. I encouraged the  patient to reduce amount of cheese consumption and/or dairy if she feels like she has dietary sensitivity to that.  I encouraged her to try to get more fiber in her diet and to regulate bowel movements to a more regular pattern, may use Colace if needed.  If in doing these measures she still continues to have pelvic pain recurrence then we could consider checking a pelvic ultrasound.  Joseph Pierini MD 07/08/20

## 2020-07-10 LAB — URINALYSIS, COMPLETE W/RFL CULTURE
Bilirubin Urine: NEGATIVE
Glucose, UA: NEGATIVE
Hyaline Cast: NONE SEEN /LPF
Ketones, ur: NEGATIVE
Leukocyte Esterase: NEGATIVE
Nitrites, Initial: NEGATIVE
Protein, ur: NEGATIVE
Specific Gravity, Urine: 1.025 (ref 1.001–1.03)
pH: 5.5 (ref 5.0–8.0)

## 2020-07-10 LAB — URINE CULTURE
MICRO NUMBER:: 11244300
Result:: NO GROWTH
SPECIMEN QUALITY:: ADEQUATE

## 2020-07-10 LAB — CULTURE INDICATED

## 2020-07-14 ENCOUNTER — Ambulatory Visit: Payer: BC Managed Care – PPO | Admitting: Obstetrics and Gynecology

## 2020-08-01 ENCOUNTER — Other Ambulatory Visit: Payer: Self-pay | Admitting: Internal Medicine

## 2020-08-01 DIAGNOSIS — I10 Essential (primary) hypertension: Secondary | ICD-10-CM

## 2020-08-04 ENCOUNTER — Other Ambulatory Visit (HOSPITAL_COMMUNITY): Payer: Self-pay | Admitting: Ophthalmology

## 2020-08-04 ENCOUNTER — Other Ambulatory Visit: Payer: Self-pay

## 2020-08-04 ENCOUNTER — Ambulatory Visit (HOSPITAL_COMMUNITY)
Admission: RE | Admit: 2020-08-04 | Discharge: 2020-08-04 | Disposition: A | Discharge status: Home / Self Care | Payer: BC Managed Care – PPO | Source: Ambulatory Visit | Attending: Cardiovascular Disease | Admitting: Cardiovascular Disease

## 2020-08-04 DIAGNOSIS — G453 Amaurosis fugax: Secondary | ICD-10-CM | POA: Diagnosis not present

## 2020-08-10 NOTE — Telephone Encounter (Signed)
Patient is calling back to follow up about refill request for hydrochlorothiazide (MICROZIDE) 12.5 MG , please advise. CB is [phone]

## 2020-08-24 ENCOUNTER — Encounter (HOSPITAL_COMMUNITY): Payer: Self-pay

## 2020-08-24 ENCOUNTER — Other Ambulatory Visit (HOSPITAL_COMMUNITY): Payer: BC Managed Care – PPO

## 2020-08-24 NOTE — Progress Notes (Signed)
Verified appointment "no show" status with [facility] at 07:29.

## 2020-08-26 ENCOUNTER — Ambulatory Visit: Payer: BC Managed Care – PPO | Admitting: Nurse Practitioner

## 2020-08-26 ENCOUNTER — Encounter: Payer: Self-pay | Admitting: Nurse Practitioner

## 2020-08-26 ENCOUNTER — Other Ambulatory Visit: Payer: Self-pay

## 2020-08-26 VITALS — BP 140/94 | HR 88 | Resp 14 | Ht 65.0 in | Wt 184.0 lb

## 2020-08-26 DIAGNOSIS — R300 Dysuria: Secondary | ICD-10-CM

## 2020-08-26 DIAGNOSIS — N898 Other specified noninflammatory disorders of vagina: Secondary | ICD-10-CM | POA: Diagnosis not present

## 2020-08-26 DIAGNOSIS — B3731 Acute candidiasis of vulva and vagina: Secondary | ICD-10-CM

## 2020-08-26 DIAGNOSIS — B373 Candidiasis of vulva and vagina: Secondary | ICD-10-CM

## 2020-08-26 DIAGNOSIS — N390 Urinary tract infection, site not specified: Secondary | ICD-10-CM

## 2020-08-26 LAB — WET PREP FOR TRICH, YEAST, CLUE

## 2020-08-26 MED ORDER — NITROFURANTOIN MONOHYD MACRO 100 MG PO CAPS: 100.0000 mg | ORAL_CAPSULE | Freq: Two times a day (BID) | ORAL | 0 refills | Status: AC

## 2020-08-26 MED ORDER — FLUCONAZOLE 150 MG PO TABS: 150.0000 mg | ORAL_TABLET | ORAL | 0 refills | Status: DC

## 2020-08-26 NOTE — Progress Notes (Signed)
   Acute Office Visit  Subjective:    Patient ID: Samantha Delacruz, female    DOB: 1965-12-29, 55 y.o.   MRN: [id number]   HPI 55 y.o. presents today for dysuria, frequency, urgency, and vaginal discharge. She was sexually active 2 weeks ago for the first time in a while with long-term partner. Symptoms began shortly after.    Review of Systems  Constitutional: Negative.   Gastrointestinal: Negative.   Genitourinary: Positive for dysuria, frequency, urgency and vaginal discharge. Negative for hematuria.       Objective:    Physical Exam Constitutional:      Appearance: Normal appearance.  Abdominal:     Tenderness: There is no right CVA tenderness or left CVA tenderness.  Genitourinary:    General: Normal vulva.     Vagina: Normal.     Cervix: Normal.     BP (!) 140/94 (BP Location: Right Arm, Patient Position: Sitting, Cuff Size: Normal)   Pulse 88   Resp 14   Ht 5\' 5"  (1.651 m)   Wt 184 lb (83.5 kg)   LMP 07/01/2017   BMI 30.62 kg/m  Wt Readings from Last 3 Encounters:  08/26/20 184 lb (83.5 kg)  05/19/20 173 lb 6.4 oz (78.7 kg)  12/04/19 162 lb (73.5 kg)   Wet prep + yeast UA 2+ leukocytes, nitrite negative, 1+ blood, wbc 20-40, rbc 3-10, moderate bacteria     Assessment & Plan:   Problem List Items Addressed This Visit   None   Visit Diagnoses    Acute urinary tract infection    -  Primary   Relevant Medications   nitrofurantoin, macrocrystal-monohydrate, (MACROBID) 100 MG capsule   fluconazole (DIFLUCAN) 150 MG tablet   Dysuria       Relevant Orders   Urinalysis,Complete w/RFL Culture   Vaginal discharge       Relevant Orders   WET PREP FOR [place]ICH, YEAST, CLUE   Vaginal candidiasis       Relevant Medications   nitrofurantoin, macrocrystal-monohydrate, (MACROBID) 100 MG capsule   fluconazole (DIFLUCAN) 150 MG tablet      Plan: Macrobid 100 mg twice daily x 7 days. Increase fluid intake. Diflucan 150 mg tablet today and repeat in 3-5 days if  symptoms persist for total of 2 doses. Urine culture pending. She will return to office if symptoms worsen or do not improve. She is agreeable to plan.     [name] [facility], 3:43 PM 08/26/2020

## 2020-08-26 NOTE — Patient Instructions (Signed)
Urinary Tract Infection, Adult A urinary tract infection (UTI) is an infection of any part of the urinary tract. The urinary tract includes:  The kidneys.  The ureters.  The bladder.  The urethra. These organs make, store, and get rid of pee (urine) in the body. What are the causes? This infection is caused by germs (bacteria) in your genital area. These germs grow and cause swelling (inflammation) of your urinary tract. What increases the risk? The following factors may make you more likely to develop this condition:  Using a small, thin tube (catheter) to drain pee.  Not being able to control when you pee or poop (incontinence).  Being female. If you are female, these things can increase the risk: ? Using these methods to prevent pregnancy:  A medicine that kills sperm (spermicide).  A device that blocks sperm (diaphragm). ? Having low levels of a female hormone (estrogen). ? Being pregnant. You are more likely to develop this condition if:  You have genes that add to your risk.  You are sexually active.  You take antibiotic medicines.  You have trouble peeing because of: ? A prostate that is bigger than normal, if you are female. ? A blockage in the part of your body that drains pee from the bladder. ? A kidney stone. ? A nerve condition that affects your bladder. ? Not getting enough to drink. ? Not peeing often enough.  You have other conditions, such as: ? Diabetes. ? A weak disease-fighting system (immune system). ? Sickle cell disease. ? Gout. ? Injury of the spine. What are the signs or symptoms? Symptoms of this condition include:  Needing to pee right away.  Peeing small amounts often.  Pain or burning when peeing.  Blood in the pee.  Pee that smells bad or not like normal.  Trouble peeing.  Pee that is cloudy.  Fluid coming from the vagina, if you are female.  Pain in the belly or lower back. Other symptoms include:  Vomiting.  Not  feeling hungry.  Feeling mixed up (confused). This may be the first symptom in older adults.  Being tired and grouchy (irritable).  A fever.  Watery poop (diarrhea). How is this treated?  Taking antibiotic medicine.  Taking other medicines.  Drinking enough water. In some cases, you may need to see a specialist. Follow these instructions at home: Medicines  Take over-the-counter and prescription medicines only as told by your doctor.  If you were prescribed an antibiotic medicine, take it as told by your doctor. Do not stop taking it even if you start to feel better. General instructions  Make sure you: ? Pee until your bladder is empty. ? Do not hold pee for a long time. ? Empty your bladder after sex. ? Wipe from front to back after peeing or pooping if you are a female. Use each tissue one time when you wipe.  Drink enough fluid to keep your pee pale yellow.  Keep all follow-up visits.   Contact a doctor if:  You do not get better after 1-2 days.  Your symptoms go away and then come back. Get help right away if:  You have very bad back pain.  You have very bad pain in your lower belly.  You have a fever.  You have chills.  You feeling like you will vomit or you vomit. Summary  A urinary tract infection (UTI) is an infection of any part of the urinary tract.  This condition is caused by   germs in your genital area.  There are many risk factors for a UTI.  Treatment includes antibiotic medicines.  Drink enough fluid to keep your pee pale yellow. This information is not intended to replace advice given to you by your health care provider. Make sure you discuss any questions you have with your health care provider. Document Revised: 03/13/2020 Document Reviewed: 03/13/2020 Elsevier Patient Education  2021 Elsevier Inc.  

## 2020-08-29 LAB — CULTURE INDICATED

## 2020-08-29 LAB — URINALYSIS, COMPLETE W/RFL CULTURE
Bilirubin Urine: NEGATIVE
Glucose, UA: NEGATIVE
Hyaline Cast: NONE SEEN /LPF
Ketones, ur: NEGATIVE
Nitrites, Initial: NEGATIVE
Protein, ur: NEGATIVE
Specific Gravity, Urine: 1.015 (ref 1.001–1.03)
pH: 5.5 (ref 5.0–8.0)

## 2020-08-29 LAB — URINE CULTURE
MICRO NUMBER:: 11409718
SPECIMEN QUALITY:: ADEQUATE

## 2020-09-07 ENCOUNTER — Ambulatory Visit (HOSPITAL_COMMUNITY): Payer: BC Managed Care – PPO | Attending: Cardiology

## 2020-09-07 ENCOUNTER — Other Ambulatory Visit: Payer: Self-pay

## 2020-09-07 DIAGNOSIS — G453 Amaurosis fugax: Secondary | ICD-10-CM

## 2020-09-07 LAB — ECHOCARDIOGRAM COMPLETE
Area-P 1/2: 3.21 cm2
S' Lateral: 3 cm

## 2020-10-12 ENCOUNTER — Other Ambulatory Visit: Payer: Self-pay

## 2020-10-12 ENCOUNTER — Encounter: Payer: Self-pay | Admitting: Nurse Practitioner

## 2020-10-12 ENCOUNTER — Ambulatory Visit: Payer: BC Managed Care – PPO | Admitting: Nurse Practitioner

## 2020-10-12 DIAGNOSIS — B373 Candidiasis of vulva and vagina: Secondary | ICD-10-CM

## 2020-10-12 DIAGNOSIS — R300 Dysuria: Secondary | ICD-10-CM

## 2020-10-12 DIAGNOSIS — B3731 Acute candidiasis of vulva and vagina: Secondary | ICD-10-CM

## 2020-10-12 DIAGNOSIS — N898 Other specified noninflammatory disorders of vagina: Secondary | ICD-10-CM | POA: Diagnosis not present

## 2020-10-12 LAB — WET PREP FOR TRICH, YEAST, CLUE

## 2020-10-12 LAB — TIQ-NTM

## 2020-10-12 MED ORDER — FLUCONAZOLE 150 MG PO TABS: 150.0000 mg | ORAL_TABLET | ORAL | 0 refills | Status: DC

## 2020-10-12 NOTE — Progress Notes (Signed)
   Acute Office Visit  Subjective:    Patient ID: Samantha Delacruz, female    DOB: [birth year], 55 y.o.   MRN: [id number]   HPI 55 y.o. presents today for urinary frequency, blood with wiping, yellow vaginal discharge, and perineal burning. Symptoms started a couple of days ago. Sexually active with one partner. Anal and vaginal intercourse usually performed with most recent 1 week ago. Bleeding is very light with wiping and has occurred a few times. She is lactose-intolerant and ate dairy over the weekend and had diarrhea for a few days. She also had internal hemorrhoids in the past that were removed. Postmenopausal since 2018. 04/2016 D&C with myosure for benign endometrial polyp.     Review of Systems  Constitutional: Negative.   Gastrointestinal: Positive for diarrhea. Negative for anal bleeding, blood in stool and rectal pain.  Genitourinary: Positive for dysuria, vaginal bleeding, vaginal discharge and vaginal pain. Negative for flank pain, frequency, hematuria and urgency.       Objective:    Physical Exam Constitutional:      Appearance: Normal appearance.  Abdominal:     General: Abdomen is flat.     Palpations: Abdomen is soft.     Tenderness: There is no abdominal tenderness. There is no right CVA tenderness or left CVA tenderness.  Genitourinary:    Rectum: Normal. No tenderness, anal fissure, external hemorrhoid or internal hemorrhoid.       LMP 07/01/2017  Wt Readings from Last 3 Encounters:  08/26/20 184 lb (83.5 kg)  05/19/20 173 lb 6.4 oz (78.7 kg)  12/04/19 162 lb (73.5 kg)   UA: trace blood, rbc 0-2, negative nitrites, trace leukocytes, wbc 6-10, few bacteria Wet prep + yeast     Assessment & Plan:   Problem List Items Addressed This Visit   None   Visit Diagnoses    Vulvovaginal candidiasis    -  Primary   Relevant Medications   fluconazole (DIFLUCAN) 150 MG tablet   Dysuria       Relevant Orders   Urinalysis,Complete w/RFL Culture   Vaginal  lesion       Relevant Orders   HSV(herpes simplex vrs) 1+2 ab-IgG   Vaginal discharge       Relevant Orders   WET PREP FOR [place], YEAST, CLUE      Plan: Wet prep positive for yeast. Diflucan 150 mg today and repeat in 3-5 days if symptoms persist. Vulvar lesion cultured for HSV. Bleeding with wiping likely from the vulvar lesion as it bled easily when swabbing. If bleeding persists after healing it is recommended she have ultrasound to rule out uterine abnormalities. Urine culture pending. Perineal redness likely excoriation from diarrhea. No hemorrhoids or bleeding found on DRE. She is agreeable to plan.     Samantha Delacruz [facility], 11:02 AM 10/12/2020

## 2020-10-14 LAB — URINE CULTURE
MICRO NUMBER:: 11586438
Result:: NO GROWTH
SPECIMEN QUALITY:: ADEQUATE

## 2020-10-14 LAB — HERPES SIMPLEX VIRUS CULTURE W/RFLX TO TYPING

## 2020-10-14 LAB — URINALYSIS, COMPLETE W/RFL CULTURE
Bilirubin Urine: NEGATIVE
Glucose, UA: NEGATIVE
Hyaline Cast: NONE SEEN /LPF
Nitrites, Initial: NEGATIVE
Specific Gravity, Urine: 1.02 (ref 1.001–1.03)
pH: 7 (ref 5.0–8.0)

## 2020-10-14 LAB — CULTURE INDICATED

## 2020-10-14 LAB — HSV(HERPES SIMPLEX VRS) I + II AB-IGG

## 2020-10-15 ENCOUNTER — Telehealth: Payer: Self-pay | Admitting: *Deleted

## 2020-10-15 ENCOUNTER — Encounter: Payer: Self-pay | Admitting: Nurse Practitioner

## 2020-10-15 ENCOUNTER — Ambulatory Visit: Payer: BC Managed Care – PPO | Admitting: Nurse Practitioner

## 2020-10-15 ENCOUNTER — Other Ambulatory Visit: Payer: Self-pay

## 2020-10-15 VITALS — BP 114/70 | HR 70 | Resp 16 | Wt 181.0 lb

## 2020-10-15 DIAGNOSIS — R1030 Lower abdominal pain, unspecified: Secondary | ICD-10-CM

## 2020-10-15 DIAGNOSIS — N9089 Other specified noninflammatory disorders of vulva and perineum: Secondary | ICD-10-CM | POA: Diagnosis not present

## 2020-10-15 DIAGNOSIS — K625 Hemorrhage of anus and rectum: Secondary | ICD-10-CM

## 2020-10-15 DIAGNOSIS — K921 Melena: Secondary | ICD-10-CM

## 2020-10-15 NOTE — Telephone Encounter (Signed)
-----   Message from [name], NP sent at 10/15/2020  3:14 PM EST ----- Please send urgent GI referral for blood in stool with lower abdominal pain

## 2020-10-15 NOTE — Progress Notes (Signed)
   Acute Office Visit  Subjective:    Patient ID: Samantha Delacruz, female    DOB: September 26, 1965, 55 y.o.   MRN: [id number]   HPI 55 y.o. presents today for rectal bleeding and pain in lower abdomen that radiates to legs. She also has a vulvar lesion that was cultured 10/12/20 but was canceled due to expired tube. She found out after this that her partner does have history of HSV. Rectal bleeding started yesterday. She had a bowel movement that was mucousy, then 2 more that had bright red blood with some streaking on stool. The pain is in lower abdomen and is described as an ache that radiates to her upper thighs. She does participate in anal sex. She has an appt with her PCP tomorrow. She has plans to participate in a triathlon this weekend.   Review of Systems  Constitutional: Negative.   Gastrointestinal: Positive for abdominal pain, blood in stool and diarrhea. Negative for abdominal distention, constipation, nausea, rectal pain and vomiting.  Genitourinary: Positive for genital sores.       Objective:    Physical Exam Constitutional:      Appearance: Normal appearance.  Abdominal:     Palpations: Abdomen is soft.     Tenderness: There is no abdominal tenderness.  Genitourinary:      BP 114/70   Pulse 70   Resp 16   Wt 181 lb (82.1 kg)   LMP 07/01/2017   BMI 30.12 kg/m  Wt Readings from Last 3 Encounters:  10/15/20 181 lb (82.1 kg)  08/26/20 184 lb (83.5 kg)  05/19/20 173 lb 6.4 oz (78.7 kg)        Assessment & Plan:   Problem List Items Addressed This Visit      Digestive   Rectal bleeding    Other Visit Diagnoses    Vulvar lesion    -  Primary   Relevant Orders   Herpes simplex virus culture   Lower abdominal pain         Plan: Urgent GI referral for abdominal pain with rectal bleeding. She is instructed to go to ER if she experiences increased blood in stool, severe abdominal pain, or dizziness. Recommended not participating in Triathlon this weekend.  HSV culture obtained. We also discussed HSV transmission and management. All questions answered. She is agreeable to plan.     [name] [facility], 4:09 PM 10/15/2020

## 2020-10-15 NOTE — Telephone Encounter (Signed)
Patient scheduled on 10/21/20 @ 9:00am with Noralyn Pick, NP. Patient informed with below note.

## 2020-10-16 ENCOUNTER — Encounter: Payer: Self-pay | Admitting: Internal Medicine

## 2020-10-16 ENCOUNTER — Ambulatory Visit: Payer: BC Managed Care – PPO | Admitting: Internal Medicine

## 2020-10-16 ENCOUNTER — Telehealth: Payer: Self-pay | Admitting: Family Medicine

## 2020-10-16 ENCOUNTER — Encounter: Payer: Self-pay | Admitting: Family Medicine

## 2020-10-16 VITALS — BP 130/80 | HR 85 | Temp 98.4°F | Wt 178.5 lb

## 2020-10-16 DIAGNOSIS — K625 Hemorrhage of anus and rectum: Secondary | ICD-10-CM | POA: Diagnosis not present

## 2020-10-16 DIAGNOSIS — R8290 Unspecified abnormal findings in urine: Secondary | ICD-10-CM | POA: Diagnosis not present

## 2020-10-16 LAB — POCT URINALYSIS DIPSTICK
Bilirubin, UA: NEGATIVE
Blood, UA: POSITIVE
Glucose, UA: NEGATIVE
Ketones, UA: NEGATIVE
Nitrite, UA: NEGATIVE
Protein, UA: NEGATIVE
Spec Grav, UA: 1.01 (ref 1.010–1.025)
Urobilinogen, UA: 0.2 E.U./dL
pH, UA: 6 (ref 5.0–8.0)

## 2020-10-16 LAB — TIQ-NTM

## 2020-10-16 MED ORDER — CIPROFLOXACIN HCL 500 MG PO TABS: 500.0000 mg | ORAL_TABLET | Freq: Two times a day (BID) | ORAL | 0 refills | Status: DC

## 2020-10-16 MED ORDER — HYDROCORTISONE (PERIANAL) 2.5 % EX CREA: 1.0000 "application " | TOPICAL_CREAM | Freq: Two times a day (BID) | CUTANEOUS | 0 refills | Status: DC

## 2020-10-16 NOTE — Progress Notes (Signed)
Acute office Visit     This visit occurred during the SARS-CoV-2 public health emergency.  Safety protocols were in place, including screening questions prior to the visit, additional usage of staff PPE, and extensive cleaning of exam room while observing appropriate contact time as indicated for disinfecting solutions.    CC/Reason for Visit: Rectal bleeding, cloudy urine  HPI: Samantha Delacruz is a 55 y.o. female who is coming in today for the above mentioned reasons.  She frequently engages in anal sex.  Last encounter was February 21.  Shortly thereafter she started noticing rectal bleeding, she has had several instances of bloody stools with mucus.  Other times there is some blood-tinged stool.  She has brought pictures to share.  She has not had massive GI bleeding.  She does not feel like the anal intercourse was rough, they do use lubricant.  She has visited her GYN twice since.  The first time she had a pelvic exam and STDs were ruled out, she was found to have a yeast infection for which she took fluconazole.  She also had at that time a urine dipstick that was negative for infection.  They did send an HSV culture but apparently it was invalid.  She returned a second time when she continued to have cloudy urine.  She states "it feels like my pelvic floor is on fire".  GYN has already placed a GI referral and visit is scheduled for March 9.  She has not had fever, chills, nausea, vomiting.  Past Medical/Surgical History: Past Medical History:  Diagnosis Date  . Adult ADHD   . Anemia    history of  . Asthma   . Headache    Migraines  . Heart palpitations   . Hemorrhoids, internal, with bleeding and mucous discharge - Grade 1 02/05/2014  . Migraines   . Pneumonia   . Restless leg   . Rosacea   . Shingles   . Vertigo     Past Surgical History:  Procedure Laterality Date  . CESAREAN SECTION  08/15/92   son  . DILATATION & CURETTAGE/HYSTEROSCOPY WITH MYOSURE N/A  04/19/2016   Procedure: DILATATION & CURETTAGE/HYSTEROSCOPY WITH MYOSURE;  Surgeon: Anastasio Auerbach, MD;  Location: [facility];  Service: Gynecology;  Laterality: N/A;  request 7:30am start time   Requests one hour OR time.   . thrush      Social History:  reports that she has never smoked. She has never used smokeless tobacco. She reports current alcohol use. She reports that she does not use drugs.  Allergies: Allergies  Allergen Reactions  . Latex Rash  . Influenza Virus Vaccine Nausea And Vomiting  . Codeine Hives  . Lactose Intolerance (Gi) Diarrhea    cramping  . Mobic [Meloxicam]   . Sulfa Antibiotics Hives    Family History:  Family History  Problem Relation Age of Onset  . Depression Mother   . Diabetes Mother   . Emphysema Mother   . Stroke Mother   . Cancer Mother        uterine   . Heart disease Father   . Heart failure Father   . Thyroid disease Sister   . Thyroid disease Brother   . ADD / ADHD Son   . Stroke Maternal Grandmother   . Cancer Paternal Grandfather        esophgus   . Colon cancer Neg Hx      Current Outpatient Medications:  .  albuterol (  PROAIR HFA) 108 (90 Base) MCG/ACT inhaler, INHALE 2 PUFFS EVERY 4 HOURS AS NEEDED FOR WHEEZING OR SHORTNESS OF BREATH (Patient taking differently: Inhale 2 puffs into the lungs every 4 (four) hours as needed for wheezing or shortness of breath.), Disp: 8.5 Inhaler, Rfl: 2 .  Cranberry 200 MG CAPS, Take by mouth., Disp: , Rfl:  .  fluconazole (DIFLUCAN) 150 MG tablet, Take 1 tablet (150 mg total) by mouth every 3 (three) days., Disp: 2 tablet, Rfl: 0 .  fluticasone (FLONASE) 50 MCG/ACT nasal spray, USE 1 SPRAY IN EACH NOSTRIL DAILY, Disp: 16 g, Rfl: 0 .  fluticasone-salmeterol (ADVAIR HFA) 230-21 MCG/ACT inhaler, TAKE 2 PUFFS BY MOUTH TWICE A DAY (Patient taking differently: Inhale 2 puffs into the lungs 2 (two) times daily. TAKE 2 PUFFS BY MOUTH TWICE A DAY), Disp: 24 Inhaler, Rfl: 0 .  GARLIC PO, Take by  mouth., Disp: , Rfl:  .  Ginger, Zingiber officinalis, (GINGER PO), Take by mouth., Disp: , Rfl:  .  hydrochlorothiazide (MICROZIDE) 12.5 MG capsule, TAKE 1 CAPSULE BY MOUTH EVERY DAY, Disp: 90 capsule, Rfl: 0 .  hydrocortisone (ANUSOL-HC) 2.5 % rectal cream, Place 1 application rectally 2 (two) times daily., Disp: 30 g, Rfl: 0 .  Multiple Vitamin (MULTIVITAMIN) tablet, Take 1 tablet by mouth daily., Disp: , Rfl:  .  TURMERIC PO, Take by mouth., Disp: , Rfl:  .  UNABLE TO FIND, Med Name: apple cider, Disp: , Rfl:  .  UNABLE TO FIND, Azo probiotic po, Disp: , Rfl:   Review of Systems:  Constitutional: Denies fever, chills, diaphoresis, appetite change and fatigue.  HEENT: Denies photophobia, eye pain, redness, hearing loss, ear pain, congestion, sore throat, rhinorrhea, sneezing, mouth sores, trouble swallowing, neck pain, neck stiffness and tinnitus.   Respiratory: Denies SOB, DOE, cough, chest tightness,  and wheezing.   Cardiovascular: Denies chest pain, palpitations and leg swelling.  Gastrointestinal: Denies nausea, vomiting, diarrhea, constipation and abdominal distention.  Genitourinary: Denies urgency, frequency, hematuria, flank pain and difficulty urinating. She has had dysuria and cloudy colored urine. Endocrine: Denies: hot or cold intolerance, sweats, changes in hair or nails, polyuria, polydipsia. Musculoskeletal: Denies myalgias, back pain, joint swelling, arthralgias and gait problem.  Skin: Denies pallor, rash and wound.  Neurological: Denies dizziness, seizures, syncope, weakness, light-headedness, numbness and headaches.  Hematological: Denies adenopathy. Easy bruising, personal or family bleeding history  Psychiatric/Behavioral: Denies suicidal ideation, mood changes, confusion, nervousness, sleep disturbance and agitation    Physical Exam: Vitals:   10/16/20 1012  BP: 130/80  Pulse: 85  Temp: 98.4 F (36.9 C)  TempSrc: Oral  SpO2: 98%  Weight: 178 lb 8 oz (81 kg)     Body mass index is 29.7 kg/m.   Constitutional: NAD, calm, comfortable Eyes: PERRL, lids and conjunctivae normal ENMT: Mucous membranes are moist.  Respiratory: clear to auscultation bilaterally, no wheezing, no crackles. Normal respiratory effort. No accessory muscle use.  Cardiovascular: Regular rate and rhythm, no murmurs / rubs / gallops. No extremity edema.   Abdomen: no tenderness, no masses palpated. No hepatosplenomegaly. Bowel sounds positive.   Skin: no rashes, lesions, ulcers. No induration Neurologic: Grossly intact and nonfocal Psychiatric: Normal judgment and insight. Alert and oriented x 3. Normal mood.    Impression and Plan:  Cloudy urine  -She was unable to provide a urine sample today, she has taken sterile cup I will try and bring sample in later today for analysis.  Rectal bleeding  -Likely related to anal intercourse  given timeline.  Wonder if she might have an anal fissure or a bleeding hemorrhoid.  She does have a history of diverticulitis. -She has already been sent to GI. -I will go ahead and prescribe some hydrocortisone cream that she can apply to her anal area in case she has a fissure or even hemorrhoids this might help. -CBC.   Patient Instructions  -Nice seeing you today!!  -Lab work today; will notify you once results are available.  -Start anusol cream twice daily to rectum/anus. Make sure you follow up with GI.     Lelon Frohlich, MD [facility] [place]

## 2020-10-16 NOTE — Addendum Note (Signed)
Addended by: Westley Hummer B on: 10/16/2020 04:00 PM   Modules accepted: Orders

## 2020-10-16 NOTE — Addendum Note (Signed)
Addended by: Tessie Fass D on: 10/16/2020 04:17 PM   Modules accepted: Orders

## 2020-10-16 NOTE — Telephone Encounter (Signed)
Pt called after hours. Stated she had UTI and had antibiotic that was not sent in. Will send in cipro 500mg  bid x 7 days as per PCP's note.  Algis Greenhouse. Jerline Pain, MD 10/16/2020 7:18 PM

## 2020-10-16 NOTE — Patient Instructions (Signed)
-  Nice seeing you today!!  -Lab work today; will notify you once results are available.  -Start anusol cream twice daily to rectum/anus. Make sure you follow up with GI.

## 2020-10-17 LAB — URINE CULTURE
MICRO NUMBER:: 11609016
Result:: NO GROWTH
SPECIMEN QUALITY:: ADEQUATE

## 2020-10-20 LAB — HERPES SIMPLEX VIRUS CULTURE
MICRO NUMBER:: 11608572
SPECIMEN QUALITY:: ADEQUATE

## 2020-10-21 ENCOUNTER — Telehealth: Payer: Self-pay | Admitting: Nurse Practitioner

## 2020-10-21 ENCOUNTER — Ambulatory Visit: Payer: BC Managed Care – PPO | Admitting: Nurse Practitioner

## 2020-10-21 ENCOUNTER — Other Ambulatory Visit: Payer: Self-pay | Admitting: Nurse Practitioner

## 2020-10-21 ENCOUNTER — Encounter: Payer: Self-pay | Admitting: Nurse Practitioner

## 2020-10-21 ENCOUNTER — Telehealth: Payer: Self-pay

## 2020-10-21 ENCOUNTER — Other Ambulatory Visit (INDEPENDENT_AMBULATORY_CARE_PROVIDER_SITE_OTHER): Payer: BC Managed Care – PPO

## 2020-10-21 VITALS — Ht 65.0 in | Wt 181.0 lb

## 2020-10-21 DIAGNOSIS — K625 Hemorrhage of anus and rectum: Secondary | ICD-10-CM

## 2020-10-21 DIAGNOSIS — A609 Anogenital herpesviral infection, unspecified: Secondary | ICD-10-CM

## 2020-10-21 LAB — CBC WITH DIFFERENTIAL/PLATELET
Basophils Absolute: 0.1 10*3/uL (ref 0.0–0.1)
Basophils Relative: 1 % (ref 0.0–3.0)
Eosinophils Absolute: 0.2 10*3/uL (ref 0.0–0.7)
Eosinophils Relative: 1.9 % (ref 0.0–5.0)
HCT: 40.1 % (ref 36.0–46.0)
Hemoglobin: 13.8 g/dL (ref 12.0–15.0)
Lymphocytes Relative: 37.8 % (ref 12.0–46.0)
Lymphs Abs: 3.3 10*3/uL (ref 0.7–4.0)
MCHC: 34.5 g/dL (ref 30.0–36.0)
MCV: 84.7 fl (ref 78.0–100.0)
Monocytes Absolute: 0.8 10*3/uL (ref 0.1–1.0)
Monocytes Relative: 9.5 % (ref 3.0–12.0)
Neutro Abs: 4.3 10*3/uL (ref 1.4–7.7)
Neutrophils Relative %: 49.8 % (ref 43.0–77.0)
Platelets: 368 10*3/uL (ref 150.0–400.0)
RBC: 4.74 Mil/uL (ref 3.87–5.11)
RDW: 12.4 % (ref 11.5–15.5)
WBC: 8.7 10*3/uL (ref 4.0–10.5)

## 2020-10-21 LAB — BASIC METABOLIC PANEL
BUN: 12 mg/dL (ref 6–23)
CO2: 30 mEq/L (ref 19–32)
Calcium: 9.2 mg/dL (ref 8.4–10.5)
Chloride: 100 mEq/L (ref 96–112)
Creatinine, Ser: 0.72 mg/dL (ref 0.40–1.20)
GFR: 93.77 mL/min (ref >60.00)
Glucose, Bld: 75 mg/dL (ref 70–99)
Potassium: 3.6 mEq/L (ref 3.5–5.1)
Sodium: 138 mEq/L (ref 135–145)

## 2020-10-21 MED ORDER — VALACYCLOVIR HCL 500 MG PO TABS
500.0000 mg | ORAL_TABLET | Freq: Two times a day (BID) | ORAL | 1 refills | Status: AC
Start: 2020-10-21 — End: 2020-10-24

## 2020-10-21 NOTE — Telephone Encounter (Signed)
Spoke with Samantha Delacruz about culture results and treatment plan. She is still experiencing vaginal pain so I recommended she go ahead and take Valtrex course in case she is having new lesions. She sees GI today for rectal bleeding lower abdominal pain. All questions answered.

## 2020-10-21 NOTE — Progress Notes (Signed)
10/21/2020 Samantha Delacruz [id number] 1966-03-01   Chief Complaint: Rectal bleeding   History of Present Illness: Samantha Delacruz is a 55 year old female with a past medical history of asthma, migraine headaches, restless leg syndrome and vertigo.  She was last seen in our office by Dr. Katha Cabal 11/06/2017 due to having gastroenteritis symptoms which completely resolved.  She presents to our office today for further evaluation regarding rectal bleeding.  She passed a large amount of what appears to be mucus with some blood per the rectum on Tuesday the/09/2020.  She passed several normal brown bowel movements later the same day with a moderate amount of bright red blood in the toilet water and streaks of blood were noted on the stool.  The next day she passed a normal bowel movement with blood streaks on the stool without significant blood in the toilet water.  She is concerned that her rectal bleeding may be due to recent anal intercourse.  She is also having urinary retention and dysuria and she was treated for a UTI.  Urine cultures were negative.  She was evaluated by her gynecologist to having lower abdominal/pelvic pain and vulvar pain. She was diagnosed with genital herpes and she was prescribed Valtrex which she plans to start later today.  No current  lower abdominal pain. She reports her pelvic pressure is improving.  No fever.  She underwent a colonoscopy by Dr. Carlean Purl 05/15/2015 showed external hemorrhoids and mild diverticulosis to the left colon.  A repeat colonoscopy in 10 years was recommended.  Current Outpatient Medications on File Prior to Visit  Medication Sig Dispense Refill  . albuterol (PROAIR HFA) 108 (90 Base) MCG/ACT inhaler INHALE 2 PUFFS EVERY 4 HOURS AS NEEDED FOR WHEEZING OR SHORTNESS OF BREATH (Patient taking differently: Inhale 2 puffs into the lungs every 4 (four) hours as needed for wheezing or shortness of breath.) 8.5 Inhaler 2  . ciprofloxacin (CIPRO) 500 MG  tablet Take 1 tablet (500 mg total) by mouth 2 (two) times daily. 14 tablet 0  . Cranberry 200 MG CAPS Take by mouth.    . fluconazole (DIFLUCAN) 150 MG tablet Take 1 tablet (150 mg total) by mouth every 3 (three) days. 2 tablet 0  . fluticasone (FLONASE) 50 MCG/ACT nasal spray USE 1 SPRAY IN EACH NOSTRIL DAILY 16 g 0  . fluticasone-salmeterol (ADVAIR HFA) 230-21 MCG/ACT inhaler TAKE 2 PUFFS BY MOUTH TWICE A DAY (Patient taking differently: Inhale 2 puffs into the lungs 2 (two) times daily. TAKE 2 PUFFS BY MOUTH TWICE A DAY) 24 Inhaler 0  . GARLIC PO Take by mouth.    . Ginger, Zingiber officinalis, (GINGER PO) Take by mouth.    . hydrochlorothiazide (MICROZIDE) 12.5 MG capsule TAKE 1 CAPSULE BY MOUTH EVERY DAY 90 capsule 0  . Multiple Vitamin (MULTIVITAMIN) tablet Take 1 tablet by mouth daily.    . TURMERIC PO Take by mouth.    Marland Kitchen UNABLE TO FIND Med Name: apple cider    . UNABLE TO FIND Azo probiotic po     No current facility-administered medications on file prior to visit.   Allergies  Allergen Reactions  . Latex Rash  . Influenza Virus Vaccine Nausea And Vomiting  . Codeine Hives  . Lactose Intolerance (Gi) Diarrhea    cramping  . Mobic [Meloxicam]   . Sulfa Antibiotics Hives    Current Medications, Allergies, Past Medical History, Past Surgical History, Family History and Social History were reviewed in [employer]  medical record.   Review of Systems:   Constitutional: Negative for fever, sweats, chills or weight loss.  Respiratory: Negative for shortness of breath.   Cardiovascular: Negative for chest pain, palpitations and leg swelling.  Gastrointestinal: See HPI.  Musculoskeletal: Negative for back pain or muscle aches.  Neurological: Negative for dizziness, headaches or paresthesias.    Physical Exam: Ht 5\' 5"  (1.651 m)   Wt 181 lb (82.1 kg)   LMP 07/01/2017   BMI 30.12 kg/m  LMP 07/01/2017  General: 55 year old female in no acute distress. Head:  Normocephalic and atraumatic. Eyes: No scleral icterus. Conjunctiva pink . Ears: Normal auditory acuity. Mouth: Dentition intact. No ulcers or lesions.  Lungs: Clear throughout to auscultation. Heart: Regular rate and rhythm, no murmur. Abdomen: Soft, nontender and nondistended. No masses or hepatomegaly. Normal bowel sounds x 4 quadrants.  Rectal: No perianal herpetic lesions.  No external hemorrhoids.  Internal hemorrhoids palpated without prolapse.  No blood or stool in the rectal vault.  No significant tenderness on exam.  CMA Robin present during exam. Musculoskeletal: Symmetrical with no gross deformities. Extremities: No edema. Neurological: Alert oriented x 4. No focal deficits.  Psychological: Alert and cooperative. Normal mood and affect  Assessment and Recommendations:  60.  55 year old female with lower abdominal versus pelvic pain with new onset rectal bleeding.  Recently treated for UTI with Cipro x7 days, urine cultures were negative.  Diagnosed with genital herpes with plans to initiate Valtrex as prescribed by her gynecologist today.  No evidence of herpetic lesions to the anal rectal area exam today.  Patient currently denies having any lower abdominal pain but describes feeling a lower abdominal versus pelvic pressure. -Patient to initiate Valtrex day as prescribed by her gynecologist -CBC and CMP -Discussed scheduling an abdominal/pelvic CT with contrast if her lower abdominal/pelvic pressure persist or worsen.  Await the above lab results.  I will contact the patient 10/22/2020 for symptom update. -Schedule a diagnostic colonoscopy with Dr. Carlean Purl in 6 weeks. Colonoscopy benefits and risks discussed including risk with sedation, risk of bleeding, perforation and infection  -Apply a small amount of Desitin inside the anal opening and to the external anal area tid as needed for anal or hemorrhoidal irritation/bleeding.  -Miralax PRN -Continue follow-up with your  gynecologist

## 2020-10-21 NOTE — Telephone Encounter (Signed)
-----   Message from [name], NP sent at 10/21/2020  8:00 AM EST ----- I attempted to call Charly twice with no answer and full VM. Please try to call to discuss positive HSV culture results. At this time it is too late to treat and should be healing on its own. I will send Valtrex to her pharmacy to take as needed if she has another outbreak. She should take twice daily x 3 days as soon as she notices an outbreak. Because she has only had this one outbreak we treat episodes as needed. If she has >6 outbreaks in 1 year we will do suppressive treatment. Thank you!

## 2020-10-21 NOTE — Patient Instructions (Addendum)
If you are age 56 or younger, your body mass index should be between 19-25. Your Body mass index is 30.12 kg/m. If this is out of the aformentioned range listed, please consider follow up with your Primary Care Provider.   LABS:  Lab work has been ordered for you today. Our lab is located in the basement. Press "B" on the elevator. The lab is located at the first door on the left as you exit the elevator.  HEALTHCARE LAWS AND MY CHART RESULTS: Due to recent changes in healthcare laws, you may see the results of your imaging and laboratory studies on MyChart before your provider has had a chance to review them.   We understand that in some cases there may be results that are confusing or concerning to you. Not all laboratory results come back in the same time frame and the provider may be waiting for multiple results in order to interpret others.  Please give [place] 48 hours in order for your provider to thoroughly review all the results before contacting the office for clarification of your results.   PROCEDURES:  You have been scheduled for a colonoscopy. Please follow the written instructions given to you at your visit today. Please pick up your prep supplies at the pharmacy within the next 1-3 days. If you use inhalers (even only as needed), please bring them with you on the day of your procedure.  RECOMMENDATIONS:  Desitin: Apply a small amount to the external anal area three times a day as needed. Miralax. Dissolve one capful in 8 ounces of water and drink before bed. Please call our office if your symptoms worsen.  Please follow up with Gyn.  It was great seeing you today! Thank you for entrusting me with your care and choosing [facility].  Noralyn Pick, CRNP

## 2020-10-21 NOTE — Telephone Encounter (Signed)
I called patient and discussed results and recommendations with her.  She asked if she could have a conversation with you about this. (I did let her know her voice mail box is full.) Please give her a call.

## 2020-10-21 NOTE — Telephone Encounter (Signed)
Attempted call x 2 to discuss culture results. VM full.

## 2020-10-30 ENCOUNTER — Other Ambulatory Visit: Payer: Self-pay

## 2020-10-30 ENCOUNTER — Ambulatory Visit: Payer: BC Managed Care – PPO | Admitting: Internal Medicine

## 2020-10-30 ENCOUNTER — Encounter: Payer: Self-pay | Admitting: Internal Medicine

## 2020-10-30 VITALS — BP 120/80 | HR 102 | Temp 98.4°F | Wt 181.8 lb

## 2020-10-30 DIAGNOSIS — L509 Urticaria, unspecified: Secondary | ICD-10-CM | POA: Diagnosis not present

## 2020-10-30 MED ORDER — METHYLPREDNISOLONE ACETATE 40 MG/ML IJ SUSP
40.0000 mg | Freq: Once | INTRAMUSCULAR | Status: AC
  Administered 2020-10-30: 40 mg via INTRAMUSCULAR

## 2020-10-30 MED ORDER — PREDNISONE 10 MG (21) PO TBPK: ORAL_TABLET | ORAL | 0 refills | Status: DC

## 2020-10-30 NOTE — Progress Notes (Signed)
Acute office Visit     This visit occurred during the SARS-CoV-2 public health emergency.  Safety protocols were in place, including screening questions prior to the visit, additional usage of staff PPE, and extensive cleaning of exam room while observing appropriate contact time as indicated for disinfecting solutions.    CC/Reason for Visit: whole body rash and itching  HPI: Samantha Delacruz is a 55 y.o. female who is coming in today for the above mentioned reasons.  Within the past month she was diagnosed with a genital herpes infection and also with a UTI.  She completed treatment with Valtrex and Cipro for these.  Both of these were completed at the beginning of March and of February.  Yesterday she had a green colored donut for [employer] Day, no more than 10 minutes later she started having whole body itching and a rash.  It is now disseminated over her entire body.  It is extremely pruritic.  It spares palms and soles.  See pictures inserted below.  Past Medical/Surgical History: Past Medical History:  Diagnosis Date  . Adult ADHD   . Anemia    history of  . Asthma   . Headache    Migraines  . Heart palpitations   . Hemorrhoids, internal, with bleeding and mucous discharge - Grade 1 02/05/2014  . Herpes   . Migraines   . Pneumonia   . Restless leg   . Rosacea   . Shingles   . Vertigo     Past Surgical History:  Procedure Laterality Date  . CESAREAN SECTION  08/15/92   son  . DILATATION & CURETTAGE/HYSTEROSCOPY WITH MYOSURE N/A 04/19/2016   Procedure: DILATATION & CURETTAGE/HYSTEROSCOPY WITH MYOSURE;  Surgeon: Anastasio Auerbach, MD;  Location: [facility];  Service: Gynecology;  Laterality: N/A;  request 7:30am start time   Requests one hour OR time.   . thrush      Social History:  reports that she has never smoked. She has never used smokeless tobacco. She reports current alcohol use. She reports that she does not use drugs.  Allergies: Allergies   Allergen Reactions  . Latex Rash  . Influenza Virus Vaccine Nausea And Vomiting  . Codeine Hives  . Lactose Intolerance (Gi) Diarrhea    cramping  . Mobic [Meloxicam]   . Sulfa Antibiotics Hives    Family History:  Family History  Problem Relation Age of Onset  . Depression Mother   . Diabetes Mother   . Emphysema Mother   . Stroke Mother   . Cancer Mother        uterine   . Heart disease Father   . Heart failure Father   . Thyroid disease Sister   . Thyroid disease Brother   . ADD / ADHD Son   . Stroke Maternal Grandmother   . Cancer Paternal Grandfather        esophgus   . Colon cancer Neg Hx      Current Outpatient Medications:  .  albuterol (PROAIR HFA) 108 (90 Base) MCG/ACT inhaler, INHALE 2 PUFFS EVERY 4 HOURS AS NEEDED FOR WHEEZING OR SHORTNESS OF BREATH (Patient taking differently: Inhale 2 puffs into the lungs every 4 (four) hours as needed for wheezing or shortness of breath.), Disp: 8.5 Inhaler, Rfl: 2 .  Cranberry 200 MG CAPS, Take by mouth., Disp: , Rfl:  .  fluconazole (DIFLUCAN) 150 MG tablet, Take 1 tablet (150 mg total) by mouth every 3 (three) days., Disp: 2  tablet, Rfl: 0 .  fluticasone (FLONASE) 50 MCG/ACT nasal spray, USE 1 SPRAY IN EACH NOSTRIL DAILY, Disp: 16 g, Rfl: 0 .  fluticasone-salmeterol (ADVAIR HFA) 230-21 MCG/ACT inhaler, TAKE 2 PUFFS BY MOUTH TWICE A DAY (Patient taking differently: Inhale 2 puffs into the lungs 2 (two) times daily. TAKE 2 PUFFS BY MOUTH TWICE A DAY), Disp: 24 Inhaler, Rfl: 0 .  GARLIC PO, Take by mouth., Disp: , Rfl:  .  Ginger, Zingiber officinalis, (GINGER PO), Take by mouth., Disp: , Rfl:  .  hydrochlorothiazide (MICROZIDE) 12.5 MG capsule, TAKE 1 CAPSULE BY MOUTH EVERY DAY, Disp: 90 capsule, Rfl: 0 .  Multiple Vitamin (MULTIVITAMIN) tablet, Take 1 tablet by mouth daily., Disp: , Rfl:  .  predniSONE (STERAPRED UNI-PAK 21 TAB) 10 MG (21) TBPK tablet, Take as directed, Disp: 21 tablet, Rfl: 0 .  TURMERIC PO, Take by  mouth., Disp: , Rfl:  .  UNABLE TO FIND, Med Name: apple cider, Disp: , Rfl:  .  UNABLE TO FIND, Azo probiotic po, Disp: , Rfl:  .  valACYclovir (VALTREX) 500 MG tablet, Take 500 mg by mouth 2 (two) times daily., Disp: , Rfl:   Current Facility-Administered Medications:  .  methylPREDNISolone acetate (DEPO-MEDROL) injection 40 mg, 40 mg, Intramuscular, Once, Isaac Bliss, Rayford Halsted, MD  Review of Systems:  Constitutional: Denies fever, chills, diaphoresis, appetite change and fatigue.  HEENT: Denies photophobia, eye pain, redness, hearing loss, ear pain, congestion, sore throat, rhinorrhea, sneezing, mouth sores, trouble swallowing, neck pain, neck stiffness and tinnitus.   Respiratory: Denies SOB, DOE, cough, chest tightness,  and wheezing.   Cardiovascular: Denies chest pain, palpitations and leg swelling.  Gastrointestinal: Denies nausea, vomiting, abdominal pain, diarrhea, constipation, blood in stool and abdominal distention.  Genitourinary: Denies dysuria, urgency, frequency, hematuria, flank pain and difficulty urinating.  Endocrine: Denies: hot or cold intolerance, sweats, changes in hair or nails, polyuria, polydipsia. Musculoskeletal: Denies myalgias, back pain, joint swelling, arthralgias and gait problem.  Neurological: Denies dizziness, seizures, syncope, weakness, light-headedness, numbness and headaches.  Hematological: Denies adenopathy. Easy bruising, personal or family bleeding history  Psychiatric/Behavioral: Denies suicidal ideation, mood changes, confusion, nervousness, sleep disturbance and agitation    Physical Exam: Vitals:   10/30/20 1131  BP: 120/80  Pulse: (!) 102  Temp: 98.4 F (36.9 C)  TempSrc: Oral  SpO2: 98%  Weight: 181 lb 12.8 oz (82.5 kg)    Body mass index is 30.25 kg/m.   Constitutional: NAD, calm, comfortable Skin:             Impression and Plan:  Full body hives  -Given timeline, unlikely to be from cipro or valtrex.  More likely from food coloring or donut ingredient? -Unlikely syphilis given appearance, but send for RPR and HIV today. -Medrol in office today, will also send her with a 6 day prednisone taper. -Advised claritin q am and benadryl q pm. -May use calamine lotion PRN for itching.    Patient Instructions   -Nice seeing you today!!  -Lab work today; will notify you once results are available.  -Steroid injection in office today.  -Start prednisone taper tomorrow as instructed on package.  -Take claritin 1 tablet in the morning and benadryl 1 tablet in the evening.   Hives Hives (urticaria) are itchy, red, swollen areas on the skin. Hives can appear on any part of the body. Hives often fade within 24 hours (acute hives). Sometimes, new hives appear after old ones fade and the cycle can continue  for several days or weeks (chronic hives). Hives do not spread from person to person (are not contagious). Hives come from the body's reaction to something a person is allergic to (allergen), something that causes irritation, or various other triggers. When a person is exposed to a trigger, his or her body releases a chemical (histamine) that causes redness, itching, and swelling. Hives can appear right after exposure to a trigger or hours later. What are the causes? This condition may be caused by:  Allergies to foods or ingredients.  Insect bites or stings.  Exposure to pollen or pets.  Contact with latex or chemicals.  Spending time in sunlight, heat, or cold (exposure).  Exercise.  Stress.  Certain medicines. You can also get hives from other medical conditions and treatments, such as:  Viruses, including the common cold.  Bacterial infections, such as urinary tract infections and strep throat.  Certain medicines.  Allergy shots.  Blood transfusions. Sometimes, the cause of this condition is not known (idiopathic hives). What increases the risk? You are more likely to  develop this condition if you:  Are a woman.  Have food allergies, especially to citrus fruits, milk, eggs, peanuts, tree nuts, or shellfish.  Are allergic to: ? Medicines. ? Latex. ? Insects. ? Animals. ? Pollen. What are the signs or symptoms? Common symptoms of this condition include raised, itchy, red or white bumps or patches on your skin. These areas may:  Become large and swollen (welts).  Change in shape and location, quickly and repeatedly.  Be separate hives or connect over a large area of skin.  Sting or become painful.  Turn white when pressed in the center (blanch). In severe cases, yourhands, feet, and face may also become swollen. This may occur if hives develop deeper in your skin.   How is this diagnosed? This condition may be diagnosed by your symptoms, medical history, and physical exam.  Your skin, urine, or blood may be tested to find out what is causing your hives and to rule out other health issues.  Your health care provider may also remove a small sample of skin from the affected area and examine it under a microscope (biopsy). How is this treated? Treatment for this condition depends on the cause and severity of your symptoms. Your health care provider may recommend using cool, wet cloths (cool compresses) or taking cool showers to relieve itching. Treatment may include:  Medicines that help: ? Relieve itching (antihistamines). ? Reduce swelling (corticosteroids). ? Treat infection (antibiotics).  An injectable medicine (omalizumab). Your health care provider may prescribe this if you have chronic idiopathic hives and you continue to have symptoms even after treatment with antihistamines. Severe cases may require an emergency injection of adrenaline (epinephrine) to prevent a life-threatening allergic reaction (anaphylaxis). Follow these instructions at home: Medicines  Take and apply over-the-counter and prescription medicines only as told by  your health care provider.  If you were prescribed an antibiotic medicine, take it as told by your health care provider. Do not stop using the antibiotic even if you start to feel better. Skin care  Apply cool compresses to the affected areas.  Do not scratch or rub your skin. General instructions  Do not take hot showers or baths. This can make itching worse.  Do not wear tight-fitting clothing.  Use sunscreen and wear protective clothing when you are outside.  Avoid any substances that cause your hives. Keep a journal to help track what causes your hives. Write down: ?  What medicines you take. ? What you eat and drink. ? What products you use on your skin.  Keep all follow-up visits as told by your health care provider. This is important. Contact a health care provider if:  Your symptoms are not controlled with medicine.  Your joints are painful or swollen. Get help right away if:  You have a fever.  You have pain in your abdomen.  Your tongue or lips are swollen.  Your eyelids are swollen.  Your chest or throat feels tight.  You have trouble breathing or swallowing. These symptoms may represent a serious problem that is an emergency. Do not wait to see if the symptoms will go away. Get medical help right away. Call your local emergency services (911 in the [place] Do not drive yourself to the hospital. Summary  Hives (urticaria) are itchy, red, swollen areas on your skin. Hives come from the body's reaction to something a person is allergic to (allergen), something that causes irritation, or various other triggers.  Treatment for this condition depends on the cause and severity of your symptoms.  Avoid any substances that cause your hives. Keep a journal to help track what causes your hives.  Take and apply over-the-counter and prescription medicines only as told by your health care provider.  Keep all follow-up visits as told by your health care provider. This is  important. This information is not intended to replace advice given to you by your health care provider. Make sure you discuss any questions you have with your health care provider. Document Revised: 02/14/2018 Document Reviewed: 02/14/2018 Elsevier Patient Education  2021 [place], MD [facility]

## 2020-10-30 NOTE — Patient Instructions (Signed)
-Nice seeing you today!!  -Lab work today; will notify you once results are available.  -Steroid injection in office today.  -Start prednisone taper tomorrow as instructed on package.  -Take claritin 1 tablet in the morning and benadryl 1 tablet in the evening.   Hives Hives (urticaria) are itchy, red, swollen areas on the skin. Hives can appear on any part of the body. Hives often fade within 24 hours (acute hives). Sometimes, new hives appear after old ones fade and the cycle can continue for several days or weeks (chronic hives). Hives do not spread from person to person (are not contagious). Hives come from the body's reaction to something a person is allergic to (allergen), something that causes irritation, or various other triggers. When a person is exposed to a trigger, his or her body releases a chemical (histamine) that causes redness, itching, and swelling. Hives can appear right after exposure to a trigger or hours later. What are the causes? This condition may be caused by:  Allergies to foods or ingredients.  Insect bites or stings.  Exposure to pollen or pets.  Contact with latex or chemicals.  Spending time in sunlight, heat, or cold (exposure).  Exercise.  Stress.  Certain medicines. You can also get hives from other medical conditions and treatments, such as:  Viruses, including the common cold.  Bacterial infections, such as urinary tract infections and strep throat.  Certain medicines.  Allergy shots.  Blood transfusions. Sometimes, the cause of this condition is not known (idiopathic hives). What increases the risk? You are more likely to develop this condition if you:  Are a woman.  Have food allergies, especially to citrus fruits, milk, eggs, peanuts, tree nuts, or shellfish.  Are allergic to: ? Medicines. ? Latex. ? Insects. ? Animals. ? Pollen. What are the signs or symptoms? Common symptoms of this condition include raised, itchy, red  or white bumps or patches on your skin. These areas may:  Become large and swollen (welts).  Change in shape and location, quickly and repeatedly.  Be separate hives or connect over a large area of skin.  Sting or become painful.  Turn white when pressed in the center (blanch). In severe cases, yourhands, feet, and face may also become swollen. This may occur if hives develop deeper in your skin.   How is this diagnosed? This condition may be diagnosed by your symptoms, medical history, and physical exam.  Your skin, urine, or blood may be tested to find out what is causing your hives and to rule out other health issues.  Your health care provider may also remove a small sample of skin from the affected area and examine it under a microscope (biopsy). How is this treated? Treatment for this condition depends on the cause and severity of your symptoms. Your health care provider may recommend using cool, wet cloths (cool compresses) or taking cool showers to relieve itching. Treatment may include:  Medicines that help: ? Relieve itching (antihistamines). ? Reduce swelling (corticosteroids). ? Treat infection (antibiotics).  An injectable medicine (omalizumab). Your health care provider may prescribe this if you have chronic idiopathic hives and you continue to have symptoms even after treatment with antihistamines. Severe cases may require an emergency injection of adrenaline (epinephrine) to prevent a life-threatening allergic reaction (anaphylaxis). Follow these instructions at home: Medicines  Take and apply over-the-counter and prescription medicines only as told by your health care provider.  If you were prescribed an antibiotic medicine, take it as told by  your health care provider. Do not stop using the antibiotic even if you start to feel better. Skin care  Apply cool compresses to the affected areas.  Do not scratch or rub your skin. General instructions  Do not take  hot showers or baths. This can make itching worse.  Do not wear tight-fitting clothing.  Use sunscreen and wear protective clothing when you are outside.  Avoid any substances that cause your hives. Keep a journal to help track what causes your hives. Write down: ? What medicines you take. ? What you eat and drink. ? What products you use on your skin.  Keep all follow-up visits as told by your health care provider. This is important. Contact a health care provider if:  Your symptoms are not controlled with medicine.  Your joints are painful or swollen. Get help right away if:  You have a fever.  You have pain in your abdomen.  Your tongue or lips are swollen.  Your eyelids are swollen.  Your chest or throat feels tight.  You have trouble breathing or swallowing. These symptoms may represent a serious problem that is an emergency. Do not wait to see if the symptoms will go away. Get medical help right away. Call your local emergency services (911 in the [place] Do not drive yourself to the hospital. Summary  Hives (urticaria) are itchy, red, swollen areas on your skin. Hives come from the body's reaction to something a person is allergic to (allergen), something that causes irritation, or various other triggers.  Treatment for this condition depends on the cause and severity of your symptoms.  Avoid any substances that cause your hives. Keep a journal to help track what causes your hives.  Take and apply over-the-counter and prescription medicines only as told by your health care provider.  Keep all follow-up visits as told by your health care provider. This is important. This information is not intended to replace advice given to you by your health care provider. Make sure you discuss any questions you have with your health care provider. Document Revised: 02/14/2018 Document Reviewed: 02/14/2018 Elsevier Patient Education  [place].

## 2020-10-31 ENCOUNTER — Emergency Department (HOSPITAL_COMMUNITY)
Admission: EM | Admit: 2020-10-31 | Discharge: 2020-10-31 | Disposition: A | Discharge status: Home / Self Care | Payer: BC Managed Care – PPO | Attending: Emergency Medicine | Admitting: Emergency Medicine

## 2020-10-31 ENCOUNTER — Encounter (HOSPITAL_COMMUNITY): Payer: Self-pay

## 2020-10-31 ENCOUNTER — Other Ambulatory Visit: Payer: Self-pay

## 2020-10-31 DIAGNOSIS — Z9102 Food additives allergy status: Secondary | ICD-10-CM | POA: Diagnosis not present

## 2020-10-31 DIAGNOSIS — Z7951 Long term (current) use of inhaled steroids: Secondary | ICD-10-CM | POA: Diagnosis not present

## 2020-10-31 DIAGNOSIS — L509 Urticaria, unspecified: Secondary | ICD-10-CM

## 2020-10-31 DIAGNOSIS — J45909 Unspecified asthma, uncomplicated: Secondary | ICD-10-CM | POA: Diagnosis not present

## 2020-10-31 DIAGNOSIS — Z91040 Latex allergy status: Secondary | ICD-10-CM | POA: Diagnosis not present

## 2020-10-31 MED ORDER — METHYLPREDNISOLONE SODIUM SUCC 125 MG IJ SOLR
125.0000 mg | Freq: Once | INTRAMUSCULAR | Status: AC
  Administered 2020-10-31: 125 mg via INTRAVENOUS
  Filled 2020-10-31: qty 2

## 2020-10-31 MED ORDER — DIPHENHYDRAMINE HCL 50 MG/ML IJ SOLN
50.0000 mg | Freq: Once | INTRAMUSCULAR | Status: AC
  Administered 2020-10-31: 50 mg via INTRAVENOUS
  Filled 2020-10-31: qty 1

## 2020-10-31 MED ORDER — FAMOTIDINE IN NACL 20-0.9 MG/50ML-% IV SOLN
20.0000 mg | Freq: Once | INTRAVENOUS | Status: AC
  Administered 2020-10-31: 20 mg via INTRAVENOUS
  Filled 2020-10-31: qty 50

## 2020-10-31 MED ORDER — HYDROXYZINE HCL 25 MG PO TABS: ORAL_TABLET | ORAL | 1 refills | Status: DC

## 2020-10-31 NOTE — ED Provider Notes (Signed)
[facility] DEPT Provider Note: Georgena Spurling, MD, FACEP  CSN: [id number] MRN: [id number] ARRIVAL: 10/31/20 at [place]: [place]  Allergic Reaction   HISTORY OF PRESENT ILLNESS  10/31/20 3:32 AM Samantha Delacruz is a 55 y.o. female who ate a doughnut containing green dye (in honor of [employer] [place] Day) 2 days ago.  Within 20 minutes she developed generalized itching and hives.  The hives have worsened and they are now severe.  They are generalized.  She has been taking Benadryl, last dose was 50 mg about 11 PM yesterday evening which gave her for relief for only about 2 hours.  She has not had throat swelling, difficulty breathing, nausea, vomiting or diarrhea.  She was given a prescription for prednisone but has not yet started taking it.  Patient has a history of a traumatic brain injury and did not recall that she previously had an allergic reaction to green dye in beer several years ago.  A friend reminded her of this.   Past Medical History:  Diagnosis Date  . Adult ADHD   . Anemia    history of  . Asthma   . Headache    Migraines  . Heart palpitations   . Hemorrhoids, internal, with bleeding and mucous discharge - Grade 1 02/05/2014  . Herpes   . Migraines   . Pneumonia   . Restless leg   . Rosacea   . Shingles   . Vertigo     Past Surgical History:  Procedure Laterality Date  . CESAREAN SECTION  08/15/92   son  . DILATATION & CURETTAGE/HYSTEROSCOPY WITH MYOSURE N/A 04/19/2016   Procedure: DILATATION & CURETTAGE/HYSTEROSCOPY WITH MYOSURE;  Surgeon: Anastasio Auerbach, MD;  Location: [facility];  Service: Gynecology;  Laterality: N/A;  request 7:30am start time   Requests one hour OR time.   . thrush      Family History  Problem Relation Age of Onset  . Depression Mother   . Diabetes Mother   . Emphysema Mother   . Stroke Mother   . Cancer Mother        uterine   . Heart disease Father   . Heart failure Father   . Thyroid disease  Sister   . Thyroid disease Brother   . ADD / ADHD Son   . Stroke Maternal Grandmother   . Cancer Paternal Grandfather        esophgus   . Colon cancer Neg Hx     Social History   Tobacco Use  . Smoking status: Never Smoker  . Smokeless tobacco: Never Used  Vaping Use  . Vaping Use: Never used  Substance Use Topics  . Alcohol use: Yes    Alcohol/week: 0.0 standard drinks    Comment: occ beer  . Drug use: No    Prior to Admission medications   Medication Sig Start Date End Date Taking? Authorizing Provider  hydrOXYzine (ATARAX/VISTARIL) 25 MG tablet Take 1 to 2 tablets every 6 hours as needed for itching or hives.  May cause drowsiness. 10/31/20  Yes Khristian Phillippi, MD  albuterol (PROAIR HFA) 108 (90 Base) MCG/ACT inhaler INHALE 2 PUFFS EVERY 4 HOURS AS NEEDED FOR WHEEZING OR SHORTNESS OF BREATH Patient taking differently: Inhale 2 puffs into the lungs every 4 (four) hours as needed for wheezing or shortness of breath. 06/19/18   Dorena Cookey, MD  Cranberry 200 MG CAPS Take by mouth.    [provider]  fluconazole (  DIFLUCAN) 150 MG tablet Take 1 tablet (150 mg total) by mouth every 3 (three) days. 10/12/20   [name], NP  fluticasone (FLONASE) 50 MCG/ACT nasal spray USE 1 SPRAY IN EACH NOSTRIL DAILY 10/25/17   Billie Ruddy, MD  fluticasone-salmeterol (ADVAIR HFA) 230-21 MCG/ACT inhaler TAKE 2 PUFFS BY MOUTH TWICE A DAY Patient taking differently: Inhale 2 puffs into the lungs 2 (two) times daily. TAKE 2 PUFFS BY MOUTH TWICE A DAY 08/17/18   Marletta Lor, MD  GARLIC PO Take by mouth.    [provider]  Ginger, Zingiber officinalis, (GINGER PO) Take by mouth.    [provider]  hydrochlorothiazide (MICROZIDE) 12.5 MG capsule TAKE 1 CAPSULE BY MOUTH EVERY DAY 08/11/20   Isaac Bliss, Rayford Halsted, MD  Multiple Vitamin (MULTIVITAMIN) tablet Take 1 tablet by mouth daily.    [provider]  predniSONE (STERAPRED UNI-PAK 21 TAB) 10  MG (21) TBPK tablet Take as directed 10/30/20   Isaac Bliss, Rayford Halsted, MD  TURMERIC PO Take by mouth.    [provider]  UNABLE TO FIND Med Name: apple cider    [provider]  UNABLE TO FIND Azo probiotic po    [provider]  valACYclovir (VALTREX) 500 MG tablet Take 500 mg by mouth 2 (two) times daily.    [provider]    Allergies Latex, Green dyes, Influenza virus vaccine, Codeine, Lactose intolerance (gi), Mobic [meloxicam], and Sulfa antibiotics   REVIEW OF SYSTEMS  Negative except as noted here or in the History of Present Illness.   PHYSICAL EXAMINATION  Initial Vital Signs Blood pressure (!) 149/86, pulse (!) 107, temperature 97.7 F (36.5 C), temperature source Oral, resp. rate 18, height 5\' 5"  (1.651 m), weight 82.1 kg, last menstrual period 07/01/2017, SpO2 99 %.  Examination General: Well-developed, well-nourished female in no acute distress; appearance consistent with age of record HENT: normocephalic; atraumatic; no pharyngeal edema Eyes: pupils equal, round and reactive to light; extraocular muscles intact Neck: supple Heart: regular rate and rhythm Lungs: clear to auscultation bilaterally Abdomen: soft; nondistended; nontender; bowel sounds present Extremities: No deformity; full range of motion; pulses normal Neurologic: Awake, alert and oriented; motor function intact in all extremities and symmetric; no facial droop Skin: Warm and dry; generalized urticarial rash:      Psychiatric: Normal mood and affect   RESULTS  Summary of this visit's results, reviewed and interpreted by myself:   EKG Interpretation  Date/Time:    Ventricular Rate:    PR Interval:    QRS Duration:   QT Interval:    QTC Calculation:   R Axis:     Text Interpretation:        Laboratory Studies: No results found for this or any previous visit (from the past 24 hour(s)). Imaging Studies: No results found.  ED COURSE and MDM   Nursing notes, initial and subsequent vitals signs, including pulse oximetry, reviewed and interpreted by myself.  Vitals:   10/31/20 0309 10/31/20 0406  BP: (!) 149/86 131/90  Pulse: (!) 107 85  Resp: 18 20  Temp: 97.7 F (36.5 C)   TempSrc: Oral   SpO2: 99% 98%  Weight: 82.1 kg   Height: 5\' 5"  (1.651 m)    Medications  diphenhydrAMINE (BENADRYL) injection 50 mg (50 mg Intravenous Given 10/31/20 0359)  famotidine (PEPCID) IVPB 20 mg premix (0 mg Intravenous Stopped 10/31/20 0455)  methylPREDNISolone sodium succinate (SOLU-MEDROL) 125 mg/2 mL injection 125 mg (125  mg Intravenous Given 10/31/20 0359)   6:07 AM Itching well controlled, hives nearly resolved after IV medications.  We will prescribe hydroxyzine and encouraged the patient to start her prednisone either this evening or tomorrow morning.  She was given Solu-Medrol IV in the ED.   PROCEDURES  Procedures   ED DIAGNOSES     ICD-10-CM   1. 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 Urticaria  L50.9        Lashone Stauber, Jenny Reichmann, MD 10/31/20 [phone]

## 2020-10-31 NOTE — ED Triage Notes (Signed)
Pt reports allergic reaction starting Thursday after eating something with green dye. Reports generalized hives and itching. Was prescribed prednisone but has not started taking it yet, last dose of benadryl @ 9pm last night. Denies difficulty breathing

## 2020-10-31 NOTE — ED Notes (Signed)
Patient states itching is still somewhat present, but over all better.

## 2020-10-31 NOTE — ED Notes (Signed)
Patient ambulated to bathroom with assistance and made it safely back into the bed.

## 2020-11-01 ENCOUNTER — Emergency Department (HOSPITAL_COMMUNITY)
Admission: EM | Admit: 2020-11-01 | Discharge: 2020-11-01 | Disposition: A | Discharge status: Home / Self Care | Payer: BC Managed Care – PPO | Attending: Emergency Medicine | Admitting: Emergency Medicine

## 2020-11-01 ENCOUNTER — Other Ambulatory Visit: Payer: Self-pay

## 2020-11-01 DIAGNOSIS — Z79899 Other long term (current) drug therapy: Secondary | ICD-10-CM | POA: Insufficient documentation

## 2020-11-01 DIAGNOSIS — Z91040 Latex allergy status: Secondary | ICD-10-CM | POA: Insufficient documentation

## 2020-11-01 DIAGNOSIS — I10 Essential (primary) hypertension: Secondary | ICD-10-CM | POA: Diagnosis not present

## 2020-11-01 DIAGNOSIS — L509 Urticaria, unspecified: Secondary | ICD-10-CM | POA: Diagnosis present

## 2020-11-01 DIAGNOSIS — J45909 Unspecified asthma, uncomplicated: Secondary | ICD-10-CM | POA: Diagnosis not present

## 2020-11-01 DIAGNOSIS — Z7951 Long term (current) use of inhaled steroids: Secondary | ICD-10-CM | POA: Insufficient documentation

## 2020-11-01 MED ORDER — DEXAMETHASONE SODIUM PHOSPHATE 10 MG/ML IJ SOLN
10.0000 mg | Freq: Once | INTRAMUSCULAR | Status: AC
  Administered 2020-11-01: 10 mg via INTRAVENOUS
  Filled 2020-11-01: qty 1

## 2020-11-01 MED ORDER — EPINEPHRINE 0.3 MG/0.3ML IJ SOAJ
0.3000 mg | Freq: Once | INTRAMUSCULAR | Status: DC
  Filled 2020-11-01: qty 0.3

## 2020-11-01 MED ORDER — DIPHENHYDRAMINE HCL 50 MG/ML IJ SOLN
25.0000 mg | Freq: Once | INTRAMUSCULAR | Status: AC
  Administered 2020-11-01: 25 mg via INTRAVENOUS

## 2020-11-01 NOTE — ED Notes (Signed)
MD KNAPP notified that patient refusing EpiPen at this time.

## 2020-11-01 NOTE — Discharge Instructions (Addendum)
Continue your medications prescribed from the other day.  Sitter following up with your primary care doctor or consider seeing an allergist if the symptoms persist

## 2020-11-01 NOTE — ED Provider Notes (Signed)
[facility] DEPT Provider Note   CSN: [id number] Arrival date & time: 11/01/20  1302     History Chief Complaint  Patient presents with  . Urticaria    Samantha Delacruz is a 55 y.o. female.  HPI   Patient presents ER for evaluation recurrent urticaria.  Patient states she initially started having a reaction after having green beer on March 17.  Patient forgot that she did have an allergy to green food dye.  She ended up going to the emergency room on the 19th.  She was given Benadryl and antihistamines as well as steroids.  Patient states she noticed some improvement after that.  However today the symptoms returned and were more severe again.  She is having diffuse rashes and itching.  She is not having any difficulty breathing or speaking.  Patient thinks she was exposed to some allergens at home including cat here that may have triggered this again.  He has tried all the medications that she was prescribed and she still is very uncomfortable and itchy.  Past Medical History:  Diagnosis Date  . Adult ADHD   . Anemia    history of  . Asthma   . Headache    Migraines  . Heart palpitations   . Hemorrhoids, internal, with bleeding and mucous discharge - Grade 1 02/05/2014  . Herpes   . Migraines   . Pneumonia   . Restless leg   . Rosacea   . Shingles   . Vertigo     Patient Active Problem List   Diagnosis Date Noted  . HTN (hypertension) 05/14/2019  . Right hip pain 08/21/2014  . Palpitations 08/04/2014  . 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 Rectal bleeding 12/10/2013  . Allergic rhinitis 09/26/2013  . Benign paroxysmal positional vertigo 06/20/2013  . Seborrheic keratosis, inflamed 05/23/2013  . Fibrocystic breast changes 10/02/2012  . Overweight 10/02/2012    Past Surgical History:  Procedure Laterality Date  . CESAREAN SECTION  08/15/92   son  . DILATATION & CURETTAGE/HYSTEROSCOPY WITH MYOSURE N/A 04/19/2016   Procedure: DILATATION &  CURETTAGE/HYSTEROSCOPY WITH MYOSURE;  Surgeon: Anastasio Auerbach, MD;  Location: [facility];  Service: Gynecology;  Laterality: N/A;  request 7:30am start time   Requests one hour OR time.   . thrush       OB History    Gravida  1   Para  1   Term      Preterm      AB      Living  1     SAB      IAB      Ectopic      Multiple      Live Births              Family History  Problem Relation Age of Onset  . Depression Mother   . Diabetes Mother   . Emphysema Mother   . Stroke Mother   . Cancer Mother        uterine   . Heart disease Father   . Heart failure Father   . Thyroid disease Sister   . Thyroid disease Brother   . ADD / ADHD Son   . Stroke Maternal Grandmother   . Cancer Paternal Grandfather        esophgus   . Colon cancer Neg Hx     Social History   Tobacco Use  . Smoking status: Never Smoker  . Smokeless tobacco: Never Used  Vaping Use  . Vaping Use: Never used  Substance Use Topics  . Alcohol use: Yes    Alcohol/week: 0.0 standard drinks    Comment: occ beer  . Drug use: No    Home Medications Prior to Admission medications   Medication Sig Start Date End Date Taking? Authorizing Provider  albuterol (PROAIR HFA) 108 (90 Base) MCG/ACT inhaler INHALE 2 PUFFS EVERY 4 HOURS AS NEEDED FOR WHEEZING OR SHORTNESS OF BREATH Patient taking differently: Inhale 2 puffs into the lungs every 4 (four) hours as needed for wheezing or shortness of breath. 06/19/18   Dorena Cookey, MD  Cranberry 200 MG CAPS Take by mouth.    [provider]  fluconazole (DIFLUCAN) 150 MG tablet Take 1 tablet (150 mg total) by mouth every 3 (three) days. 10/12/20   [name], NP  fluticasone (FLONASE) 50 MCG/ACT nasal spray USE 1 SPRAY IN EACH NOSTRIL DAILY 10/25/17   Billie Ruddy, MD  fluticasone-salmeterol (ADVAIR HFA) 230-21 MCG/ACT inhaler TAKE 2 PUFFS BY MOUTH TWICE A DAY Patient taking differently: Inhale 2 puffs into the lungs 2 (two) times  daily. TAKE 2 PUFFS BY MOUTH TWICE A DAY 08/17/18   Marletta Lor, MD  GARLIC PO Take by mouth.    [provider]  Ginger, Zingiber officinalis, (GINGER PO) Take by mouth.    [provider]  hydrochlorothiazide (MICROZIDE) 12.5 MG capsule TAKE 1 CAPSULE BY MOUTH EVERY DAY 08/11/20   Isaac Bliss, Rayford Halsted, MD  hydrOXYzine (ATARAX/VISTARIL) 25 MG tablet Take 1 to 2 tablets every 6 hours as needed for itching or hives.  May cause drowsiness. 10/31/20   Molpus, John, MD  Multiple Vitamin (MULTIVITAMIN) tablet Take 1 tablet by mouth daily.    [provider]  predniSONE (STERAPRED UNI-PAK 21 TAB) 10 MG (21) TBPK tablet Take as directed 10/30/20   Isaac Bliss, Rayford Halsted, MD  TURMERIC PO Take by mouth.    [provider]  UNABLE TO FIND Med Name: apple cider    [provider]  UNABLE TO FIND Azo probiotic po    [provider]  valACYclovir (VALTREX) 500 MG tablet Take 500 mg by mouth 2 (two) times daily.    [provider]    Allergies    Latex, Green dyes, Influenza virus vaccine, Codeine, Lactose intolerance (gi), Mobic [meloxicam], and Sulfa antibiotics  Review of Systems   Review of Systems  All other systems reviewed and are negative.   Physical Exam Updated Vital Signs BP 118/85   Pulse 73   Temp 97.9 F (36.6 C) (Oral)   Resp 20   Ht 1.651 m (5\' 5" )   Wt 82.1 kg   LMP 07/01/2017   SpO2 95%   BMI 30.12 kg/m   Physical Exam Vitals and nursing note reviewed.  Constitutional:      General: She is not in acute distress.    Appearance: She is well-developed.  HENT:     Head: Normocephalic and atraumatic.     Right Ear: External ear normal.     Left Ear: External ear normal.     Mouth/Throat:     Mouth: Mucous membranes are moist.     Comments: No oropharyngeal edema noted Eyes:     General: No scleral icterus.       Right eye: No discharge.        Left eye: No discharge.     Conjunctiva/sclera:  Conjunctivae normal.  Neck:  Trachea: No tracheal deviation.  Cardiovascular:     Rate and Rhythm: Normal rate.  Pulmonary:     Effort: Pulmonary effort is normal. No respiratory distress.     Breath sounds: No stridor.  Abdominal:     General: There is no distension.  Musculoskeletal:        General: No swelling or deformity.     Cervical back: Neck supple.  Skin:    General: Skin is warm and dry.     Findings: No rash.     Comments: Diffuse urticaria  Neurological:     Mental Status: She is alert.     Cranial Nerves: Cranial nerve deficit: no gross deficits.     ED Results / Procedures / Treatments   Labs (all labs ordered are listed, but only abnormal results are displayed) Labs Reviewed - No data to display  EKG None  Radiology No results found.  Procedures Procedures   Medications Ordered in ED Medications  EPINEPHrine (EPI-PEN) injection 0.3 mg (0 mg Intramuscular Hold 11/01/20 1448)  diphenhydrAMINE (BENADRYL) injection 25 mg (25 mg Intravenous Given 11/01/20 1437)  dexamethasone (DECADRON) injection 10 mg (10 mg Intravenous Given 11/01/20 1437)    ED Course  I have reviewed the triage vital signs and the nursing notes.  Pertinent labs & imaging results that were available during my care of the patient were reviewed by me and considered in my medical decision making (see chart for details).  Clinical Course as of 11/01/20 1600  Sun Nov 01, 2020  1558 Patient started to feel much better after the diphenhydramine and Decadron.  She did not want the epinephrine  and has not required it [JK]  1558 Patient is feeling much better and ready for discharge. [JK]    Clinical Course User Index [JK] Dorie Rank, MD   MDM Rules/Calculators/A&P                          Patient presented with complaints of urticaria.  No difficulty breathing.  No angioedema.  Patient was given Decadron and Benadryl with significant improvement.  Patient does have steroids as well as  antihistamines that she is taking at home.  Appears stable for discharge. Final Clinical Impression(s) / ED Diagnoses Final diagnoses:  Urticaria    Rx / DC Orders ED Discharge Orders    None       Dorie Rank, MD 11/01/20 1600

## 2020-11-01 NOTE — ED Triage Notes (Signed)
Patient reports she had allergic reaction to green donut on st patricks day. Was evaluated two days ago but still having issues. Patient says her significant other did some construction and used a solvent and patient was cleaning it up today.. so this may have triggered more hives. Patient denies any difficulty breathing. Airway is patent at this time.

## 2020-11-02 LAB — RPR: RPR Ser Ql: NONREACTIVE

## 2020-11-02 LAB — HIV ANTIBODY (ROUTINE TESTING W REFLEX): HIV 1&2 Ab, 4th Generation: NONREACTIVE

## 2020-11-03 ENCOUNTER — Telehealth: Payer: BC Managed Care – PPO | Admitting: Internal Medicine

## 2020-11-04 ENCOUNTER — Ambulatory Visit: Payer: BC Managed Care – PPO | Admitting: Internal Medicine

## 2020-11-04 ENCOUNTER — Telehealth: Payer: Self-pay | Admitting: Internal Medicine

## 2020-11-04 ENCOUNTER — Encounter: Payer: Self-pay | Admitting: Internal Medicine

## 2020-11-04 ENCOUNTER — Other Ambulatory Visit: Payer: Self-pay

## 2020-11-04 VITALS — BP 160/100 | HR 92 | Temp 98.1°F | Wt 180.1 lb

## 2020-11-04 DIAGNOSIS — I10 Essential (primary) hypertension: Secondary | ICD-10-CM

## 2020-11-04 DIAGNOSIS — L509 Urticaria, unspecified: Secondary | ICD-10-CM | POA: Diagnosis not present

## 2020-11-04 MED ORDER — HYDROCHLOROTHIAZIDE 12.5 MG PO CAPS: ORAL_CAPSULE | ORAL | 1 refills | Status: DC

## 2020-11-04 NOTE — Telephone Encounter (Signed)
Patient is calling and stated that her medication has not been called in yet and if it came be called in to [facility], please advise. CB is [phone]

## 2020-11-04 NOTE — Progress Notes (Signed)
Established Patient Office Visit     This visit occurred during the SARS-CoV-2 public health emergency.  Safety protocols were in place, including screening questions prior to the visit, additional usage of staff PPE, and extensive cleaning of exam room while observing appropriate contact time as indicated for disinfecting solutions.    CC/Reason for Visit: Follow-up hives and elevated blood pressure  HPI: Samantha Delacruz is a 55 y.o. female who is coming in today for the above mentioned reasons.  I saw her in the office last week for acute onset of full body hives following ingestion of green food coloring for [employer] Day.  She has had a traumatic brain injury and had forgotten that she had had a reaction to green food dye in the past.  She was given a Medrol injection as well as a prednisone taper and was counseled to use Benadryl and Claritin daily.  She had to go to the emergency department due to worsening of the hives.  Pictures from both my visit and the ED visit are in the chart.  Hives where quite impressive during her ED follow-up.  She was given more steroids.  She still has 3 days remaining of her prednisone taper.  She has noticed throughout this whole ordeal that her blood pressure has been elevated.  It is 160/100 in office today.  She has not had any headache, chest pain, shortness of breath or focal neurologic deficit.  Past Medical/Surgical History: Past Medical History:  Diagnosis Date  . Adult ADHD   . Anemia    history of  . Asthma   . Headache    Migraines  . Heart palpitations   . Hemorrhoids, internal, with bleeding and mucous discharge - Grade 1 02/05/2014  . Herpes   . Migraines   . Pneumonia   . Restless leg   . Rosacea   . Shingles   . Vertigo     Past Surgical History:  Procedure Laterality Date  . CESAREAN SECTION  08/15/92   son  . DILATATION & CURETTAGE/HYSTEROSCOPY WITH MYOSURE N/A 04/19/2016   Procedure: DILATATION &  CURETTAGE/HYSTEROSCOPY WITH MYOSURE;  Surgeon: Anastasio Auerbach, MD;  Location: [facility];  Service: Gynecology;  Laterality: N/A;  request 7:30am start time   Requests one hour OR time.   . thrush      Social History:  reports that she has never smoked. She has never used smokeless tobacco. She reports current alcohol use. She reports that she does not use drugs.  Allergies: Allergies  Allergen Reactions  . Latex Rash  . Green Dyes Hives  . Influenza Virus Vaccine Nausea And Vomiting  . Codeine Hives  . Lactose Intolerance (Gi) Diarrhea    cramping  . Mobic [Meloxicam]   . Sulfa Antibiotics Hives    Family History:  Family History  Problem Relation Age of Onset  . Depression Mother   . Diabetes Mother   . Emphysema Mother   . Stroke Mother   . Cancer Mother        uterine   . Heart disease Father   . Heart failure Father   . Thyroid disease Sister   . Thyroid disease Brother   . ADD / ADHD Son   . Stroke Maternal Grandmother   . Cancer Paternal Grandfather        esophgus   . Colon cancer Neg Hx      Current Outpatient Medications:  .  albuterol (PROAIR HFA) 108 (  90 Base) MCG/ACT inhaler, INHALE 2 PUFFS EVERY 4 HOURS AS NEEDED FOR WHEEZING OR SHORTNESS OF BREATH (Patient taking differently: Inhale 2 puffs into the lungs every 4 (four) hours as needed for wheezing or shortness of breath.), Disp: 8.5 Inhaler, Rfl: 2 .  Cranberry 200 MG CAPS, Take by mouth., Disp: , Rfl:  .  fluconazole (DIFLUCAN) 150 MG tablet, Take 1 tablet (150 mg total) by mouth every 3 (three) days., Disp: 2 tablet, Rfl: 0 .  fluticasone (FLONASE) 50 MCG/ACT nasal spray, USE 1 SPRAY IN EACH NOSTRIL DAILY, Disp: 16 g, Rfl: 0 .  fluticasone-salmeterol (ADVAIR HFA) 230-21 MCG/ACT inhaler, TAKE 2 PUFFS BY MOUTH TWICE A DAY (Patient taking differently: Inhale 2 puffs into the lungs 2 (two) times daily. TAKE 2 PUFFS BY MOUTH TWICE A DAY), Disp: 24 Inhaler, Rfl: 0 .  GARLIC PO, Take by mouth., Disp: , Rfl:   .  Ginger, Zingiber officinalis, (GINGER PO), Take by mouth., Disp: , Rfl:  .  hydrochlorothiazide (MICROZIDE) 12.5 MG capsule, TAKE 1 CAPSULE BY MOUTH EVERY DAY, Disp: 90 capsule, Rfl: 0 .  hydrOXYzine (ATARAX/VISTARIL) 25 MG tablet, Take 1 to 2 tablets every 6 hours as needed for itching or hives.  May cause drowsiness., Disp: 30 tablet, Rfl: 1 .  Multiple Vitamin (MULTIVITAMIN) tablet, Take 1 tablet by mouth daily., Disp: , Rfl:  .  predniSONE (STERAPRED UNI-PAK 21 TAB) 10 MG (21) TBPK tablet, Take as directed, Disp: 21 tablet, Rfl: 0 .  TURMERIC PO, Take by mouth., Disp: , Rfl:  .  UNABLE TO FIND, Med Name: apple cider, Disp: , Rfl:  .  UNABLE TO FIND, Azo probiotic po, Disp: , Rfl:  .  valACYclovir (VALTREX) 500 MG tablet, Take 500 mg by mouth 2 (two) times daily., Disp: , Rfl:   Review of Systems:  Constitutional: Denies fever, chills, diaphoresis, appetite change and fatigue.  HEENT: Denies photophobia, eye pain, redness, hearing loss, ear pain, congestion, sore throat, rhinorrhea, sneezing, mouth sores, trouble swallowing, neck pain, neck stiffness and tinnitus.   Respiratory: Denies SOB, DOE, cough, chest tightness,  and wheezing.   Cardiovascular: Denies chest pain, palpitations and leg swelling.  Gastrointestinal: Denies nausea, vomiting, abdominal pain, diarrhea, constipation, blood in stool and abdominal distention.  Genitourinary: Denies dysuria, urgency, frequency, hematuria, flank pain and difficulty urinating.  Endocrine: Denies: hot or cold intolerance, sweats, changes in hair or nails, polyuria, polydipsia. Musculoskeletal: Denies myalgias, back pain, joint swelling, arthralgias and gait problem.  Skin: Denies pallor, rash and wound.  Neurological: Denies dizziness, seizures, syncope, weakness, light-headedness, numbness and headaches.  Hematological: Denies adenopathy. Easy bruising, personal or family bleeding history  Psychiatric/Behavioral: Denies suicidal ideation,  mood changes, confusion, nervousness, sleep disturbance and agitation    Physical Exam: Vitals:   11/04/20 1030  BP: (!) 160/100  Pulse: 92  Temp: 98.1 F (36.7 C)  TempSrc: Oral  SpO2: 97%  Weight: 180 lb 1.6 oz (81.7 kg)    Body mass index is 29.97 kg/m.   Constitutional: NAD, calm, comfortable Eyes: PERRL, lids and conjunctivae normal ENMT: Mucous membranes are moist. Skin: Hives are completely resolved Neurologic: Grossly intact and nonfocal Psychiatric: Normal judgment and insight. Alert and oriented x 3. Normal mood.    Impression and Plan:  Full body hives  -Resolved at this point, advised to complete her steroid course as prescribed. -I will refer her to allergy for skin patch testing per her request.  Primary hypertension -While she does have a history of hypertension,  it has been very well controlled on daily hydrochlorothiazide. -I believe her transient elevated blood pressures are due to the ordeal with hives as well as multiple high-dose steroids. -Have advised her to monitor her blood pressure over the course of the next 6 to 8 weeks and follow-up with me then.  If blood pressures remain elevated we can consider augmenting her antihypertensive therapy.    Lelon Frohlich, MD [facility]

## 2020-11-04 NOTE — Telephone Encounter (Signed)
What medication? We did not start any new meds today.Marland KitchenMarland Kitchen

## 2020-11-04 NOTE — Telephone Encounter (Signed)
Spoke with patient and she has changed her pharmacy to [facility] because they have medication she needs with no dye.  Refill sent of HCTZ.

## 2020-11-06 ENCOUNTER — Ambulatory Visit: Payer: Self-pay | Admitting: Internal Medicine

## 2020-11-06 ENCOUNTER — Telehealth: Payer: Self-pay | Admitting: Internal Medicine

## 2020-11-06 NOTE — Telephone Encounter (Signed)
Pt call and need a call back to talk about her bp medication her # is [phone].

## 2020-11-06 NOTE — Telephone Encounter (Signed)
Patient received her new prescription for BP medication that is dye free.  When she read about the medication it did say it had sulfa in it, and the patient has hives when taking any medication with sulfa.  Patient also states that the allergist that she see, does not test for dyes or medications.  They told her that her PCP would have to refer her to a specialist at [facility] or [place].    Patient is aware that Dr Jerilee Hoh is not in the office and will not return until Tuesday.  Advised patient to speak with her pharmacist concerning her medication, and to get a name of a provider she wishes to see for her allergies.  Patient will call back.

## 2020-11-10 ENCOUNTER — Other Ambulatory Visit: Payer: Self-pay | Admitting: Internal Medicine

## 2020-11-10 DIAGNOSIS — I10 Essential (primary) hypertension: Secondary | ICD-10-CM

## 2020-11-10 MED ORDER — AMLODIPINE BESYLATE 5 MG PO TABS: 5.0000 mg | ORAL_TABLET | Freq: Every day | ORAL | 1 refills | Status: DC

## 2020-11-10 NOTE — Telephone Encounter (Signed)
STOP HCTZ. Start amlodipine 5 mg daily instead.

## 2020-11-11 NOTE — Telephone Encounter (Signed)
Patient is aware about medication and referral

## 2020-11-11 NOTE — Telephone Encounter (Signed)
Pt is calling the office back and would like to have a call back.  Pt is aware of the below msg but would like to have a call back.

## 2020-11-11 NOTE — Telephone Encounter (Signed)
Attempted to call the patient, but her voicemail box is not set up at this time.  Rx sent to the pharmacy

## 2020-11-14 ENCOUNTER — Other Ambulatory Visit: Payer: Self-pay | Admitting: Internal Medicine

## 2020-11-14 DIAGNOSIS — I10 Essential (primary) hypertension: Secondary | ICD-10-CM

## 2020-11-17 ENCOUNTER — Encounter: Payer: Self-pay | Admitting: Nurse Practitioner

## 2020-11-17 ENCOUNTER — Other Ambulatory Visit: Payer: Self-pay

## 2020-11-17 ENCOUNTER — Ambulatory Visit: Payer: BC Managed Care – PPO | Admitting: Nurse Practitioner

## 2020-11-17 VITALS — BP 140/98 | HR 82 | Resp 16 | Wt 183.0 lb

## 2020-11-17 DIAGNOSIS — A609 Anogenital herpesviral infection, unspecified: Secondary | ICD-10-CM

## 2020-11-17 DIAGNOSIS — R102 Pelvic and perineal pain: Secondary | ICD-10-CM

## 2020-11-17 DIAGNOSIS — Z113 Encounter for screening for infections with a predominantly sexual mode of transmission: Secondary | ICD-10-CM | POA: Diagnosis not present

## 2020-11-17 LAB — WET PREP FOR TRICH, YEAST, CLUE

## 2020-11-17 MED ORDER — VALACYCLOVIR HCL 500 MG PO TABS: 500.0000 mg | ORAL_TABLET | Freq: Every day | ORAL | 5 refills | Status: DC

## 2020-11-17 MED ORDER — LIDOCAINE 5 % EX OINT: 1.0000 "application " | TOPICAL_OINTMENT | Freq: Three times a day (TID) | CUTANEOUS | 0 refills | Status: DC

## 2020-11-17 NOTE — Progress Notes (Signed)
GYNECOLOGY  VISIT  CC:  Yellow discharge and irriation  HPI: 55 y.o. G1P1 Divorced White or Caucasian female here for vaginal itching, irritation, yellow discharge. She is uncertain if she has ever felt normal since being diagnosed with HSV.   Pt reports she is sexually active with a man that she is uncertain if he has other partners. Would like STD testing, Her PCP drew HIV and RPR.  She went to her GI referral appointment and is planning a colonoscopy in May 2022.  Reports Traumatic Brain Injury (although not in history), states she has a hard time remembering things.  GYNECOLOGIC HISTORY: Patient's last menstrual period was 07/01/2017. Contraception: post menopausal Menopausal hormone therapy: none  Patient Active Problem List   Diagnosis Date Noted  . HTN (hypertension) 05/14/2019  . Right hip pain 08/21/2014  . Palpitations 08/04/2014  . 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 Rectal bleeding 12/10/2013  . Allergic rhinitis 09/26/2013  . Benign paroxysmal positional vertigo 06/20/2013  . Seborrheic keratosis, inflamed 05/23/2013  . Fibrocystic breast changes 10/02/2012  . Overweight 10/02/2012    Past Medical History:  Diagnosis Date  . Adult ADHD   . Anemia    history of  . Asthma   . Headache    Migraines  . Heart palpitations   . Hemorrhoids, internal, with bleeding and mucous discharge - Grade 1 02/05/2014  . Herpes   . Migraines   . Pneumonia   . Restless leg   . Rosacea   . Shingles   . Vertigo     Past Surgical History:  Procedure Laterality Date  . CESAREAN SECTION  08/15/92   son  . DILATATION & CURETTAGE/HYSTEROSCOPY WITH MYOSURE N/A 04/19/2016   Procedure: DILATATION & CURETTAGE/HYSTEROSCOPY WITH MYOSURE;  Surgeon: Anastasio Auerbach, MD;  Location: [facility];  Service: Gynecology;  Laterality: N/A;  request 7:30am start time   Requests one hour OR time.   . thrush      MEDS:   Current Outpatient Medications on File Prior to Visit  Medication Sig Dispense  Refill  . albuterol (PROAIR HFA) 108 (90 Base) MCG/ACT inhaler INHALE 2 PUFFS EVERY 4 HOURS AS NEEDED FOR WHEEZING OR SHORTNESS OF BREATH (Patient taking differently: Inhale 2 puffs into the lungs every 4 (four) hours as needed for wheezing or shortness of breath.) 8.5 Inhaler 2  . amLODipine (NORVASC) 5 MG tablet Take 1 tablet (5 mg total) by mouth daily. 90 tablet 1  . Cranberry 200 MG CAPS Take by mouth.    . fluconazole (DIFLUCAN) 150 MG tablet Take 1 tablet (150 mg total) by mouth every 3 (three) days. 2 tablet 0  . fluticasone (FLONASE) 50 MCG/ACT nasal spray USE 1 SPRAY IN EACH NOSTRIL DAILY 16 g 0  . fluticasone-salmeterol (ADVAIR HFA) 230-21 MCG/ACT inhaler TAKE 2 PUFFS BY MOUTH TWICE A DAY (Patient taking differently: Inhale 2 puffs into the lungs 2 (two) times daily. TAKE 2 PUFFS BY MOUTH TWICE A DAY) 24 Inhaler 0  . GARLIC PO Take by mouth.    . Ginger, Zingiber officinalis, (GINGER PO) Take by mouth.    . hydrOXYzine (ATARAX/VISTARIL) 25 MG tablet Take 1 to 2 tablets every 6 hours as needed for itching or hives.  May cause drowsiness. 30 tablet 1  . Multiple Vitamin (MULTIVITAMIN) tablet Take 1 tablet by mouth daily.    . predniSONE (STERAPRED UNI-PAK 21 TAB) 10 MG (21) TBPK tablet Take as directed 21 tablet 0  . TURMERIC PO Take by mouth.    Marland Kitchen  UNABLE TO FIND Med Name: apple cider    . UNABLE TO FIND Azo probiotic po    . valACYclovir (VALTREX) 500 MG tablet Take 500 mg by mouth 2 (two) times daily.     No current facility-administered medications on file prior to visit.    ALLERGIES: Latex, Green dyes, Influenza virus vaccine, Codeine, Lactose intolerance (gi), Mobic [meloxicam], and Sulfa antibiotics  Family History  Problem Relation Age of Onset  . Depression Mother   . Diabetes Mother   . Emphysema Mother   . Stroke Mother   . Cancer Mother        uterine   . Heart disease Father   . Heart failure Father   . Thyroid disease Sister   . Thyroid disease Brother   . ADD  / ADHD Son   . Stroke Maternal Grandmother   . Cancer Paternal Grandfather        esophgus   . Colon cancer Neg Hx      Review of Systems  Constitutional: Negative.   HENT: Negative.   Eyes: Negative.   Respiratory: Negative.   Cardiovascular: Negative.   Gastrointestinal: Negative.   Endocrine: Negative.   Genitourinary:       Vaginal itching, irritation, yellow discharge  Musculoskeletal: Negative.   Skin: Negative.   Allergic/Immunologic: Negative.   Neurological: Negative.   Hematological: Negative.   Psychiatric/Behavioral: Negative.     PHYSICAL EXAMINATION:    LMP 07/01/2017     General appearance: alert, cooperative, no acute distress Abdomen: soft, non-tender; no masses,  no organomegaly, neg CVAT Lymph:  no inguinal LAD noted  Pelvic: External genitalia:  Ulcerated area Right labia majora consistent with HSV              Urethra:  normal appearing urethra with no masses, tenderness or lesions              Bartholins and Skenes: normal                 Vagina: atrophic, red, yellow discharge, tender with Q-tip collection of wet mount              Cervix: difficult to visualized considering tenderness              Bimanual Exam:  Uterus:  tender, does not appear to be enlarged              Adnexa: no mass, fullness, tenderness               Chaperone, Joy, CMA, was present for exam.  Assessment: Pelvic pressure in female - Plan: Urinalysis,Complete w/RFL Culture  HSV (herpes simplex virus) anogenital infection - Plan: lidocaine (XYLOCAINE) 5 % ointment  Screen for sexually transmitted diseases - Plan: WET PREP FOR [place], YEAST, CLUE, SURESWAB CT/NG/T. vaginalis    Plan: Elevated B/P today, pt states is working with PCP, just recently changed B/P medication. Has planned follow up.  Discussed HSV in detail. Will start suppressive therapy. Pt to take BID x 3-5 days then one daily x 6 months  Lidocaine 5% sent to [facility] for symptom relief. Informed  about Good Rx for better price.  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 Information provided.   35 minutes of total time was spent for this patient encounter, including preparation, face-to-face counseling with the patient and coordination of care, and documentation of the encounter.

## 2020-11-17 NOTE — Patient Instructions (Signed)
Genitourinary Syndrome of Menopause (GSM)  Genitourinary Syndrome of menopause is a term that describes the spectrum of changes caused by the lack of estrogen in menopause. Common Signs and Symptoms Include: Vaginal dryness, irritation/burning/itching. Abnormal discharge, vaginal pressure, pain with sex, decreased sexual arousal/desire, difficulty achieving orgasm, pain with urination, urgency, incontinence of urine, recurrent bladder infections, urethral prolapse and others. Diagnosis can usually be made with history and pelvic exam. Other testing may be considered to rule out other potential abnormalities. Symptoms can be progressive and chronic. The goal of treatment is symptom relief.  There are several different approaches to improving symptoms:  Vaginal Moisturizers and lubricants: Glycerin Moisturizer (Replens), Silicone based products (Uberlube), water-based products (Restore Moisturizing Gel by [employer]), Hyaluronic Acid vaginal products (such as HYALO GYN), and natural oils such as Olive Oil, Almond Oil and Coconut Oil (Since coconut oil comes in solid preparations, you can form them into suppositories and insert into the vagina as needed). These products are Over-the-Counter (OTC) at [employer] and retail stores and DO NOT contain hormones.  How to use vaginal and vulvar moisturizers . Many vaginal moisturizers come with an applicator. You will need fill the applicator with the moisturizer and then insert it carefully into your vagina. You can put lubricant on the tip of the applicator to make it easier to insert into your vagina. . You can also use vaginal moisturizers on your vulvar tissues, including your inner and outer labia (the folds of skin around your vagina). To put these moisturizers on your vulva, put a small amount (pea or grape size) of moisturizer on your finger. Then, massage the moisturizer into your vaginal opening and onto your labia. . If you recently finished  cancer treatment, or are going through sudden menopause, you may need to use the moisturizers 3 to 5 times a week to relieve your symptoms. . Vaginal and vulvar moisturizers should be used before you go to bed, so the product can be fully absorbed.  Lifestyle modifications: smoking cessation, pelvic floor physical therapy, Kegel's exercises, vaginal dilators  Non-hormonal therapies: Lidocaine (topical anesthetic) to decrease sensitivity, Oral Ospemifeme (requires prescription), Laser therapy (Pros and Cons should be discussed with provider)  Hormones Therapies: For moderate to severe GSM, vaginal estrogen is considered the most effective treatment. It can be used in combination with vaginal moisturizers and lubricants. Estrogen is delivered in small doses in the vagina with various preparations available including creams, rings, and tablets. Estrogen therapy may be contraindicated with certain hormone sensitive cancers. Vaginal DHEA and Testosterone have shown effectiveness in some cases to help with relieving vaginal atrophy and have shown mixed results with libido. Risks and benefits should be discussed prior to starting therapy.

## 2020-11-19 ENCOUNTER — Other Ambulatory Visit: Payer: BC Managed Care – PPO

## 2020-11-19 LAB — URINALYSIS, COMPLETE W/RFL CULTURE
Bacteria, UA: NONE SEEN /HPF
Bilirubin Urine: NEGATIVE
Glucose, UA: NEGATIVE
Hyaline Cast: NONE SEEN /LPF
Ketones, ur: NEGATIVE
Nitrites, Initial: NEGATIVE
Protein, ur: NEGATIVE
Specific Gravity, Urine: 1.02 (ref 1.001–1.03)
pH: 6 (ref 5.0–8.0)

## 2020-11-19 LAB — URINE CULTURE
MICRO NUMBER:: 11732825
SPECIMEN QUALITY:: ADEQUATE

## 2020-11-19 LAB — CULTURE INDICATED

## 2020-11-20 ENCOUNTER — Other Ambulatory Visit: Payer: Self-pay | Admitting: Nurse Practitioner

## 2020-11-20 ENCOUNTER — Encounter: Payer: Self-pay | Admitting: Internal Medicine

## 2020-11-20 ENCOUNTER — Other Ambulatory Visit: Payer: Self-pay

## 2020-11-20 ENCOUNTER — Ambulatory Visit: Payer: BC Managed Care – PPO | Admitting: Internal Medicine

## 2020-11-20 VITALS — BP 120/88 | HR 89 | Temp 98.3°F | Wt 180.6 lb

## 2020-11-20 DIAGNOSIS — R6884 Jaw pain: Secondary | ICD-10-CM

## 2020-11-20 DIAGNOSIS — I10 Essential (primary) hypertension: Secondary | ICD-10-CM | POA: Diagnosis not present

## 2020-11-20 DIAGNOSIS — N952 Postmenopausal atrophic vaginitis: Secondary | ICD-10-CM

## 2020-11-20 LAB — SURESWAB CT/NG/T. VAGINALIS
C. trachomatis RNA, TMA: NOT DETECTED
N. gonorrhoeae RNA, TMA: NOT DETECTED
Trichomonas vaginalis RNA: NOT DETECTED

## 2020-11-20 MED ORDER — ESTRADIOL 0.1 MG/GM VA CREA
TOPICAL_CREAM | VAGINAL | 0 refills | Status: DC
Start: 2020-11-20 — End: 2021-01-19

## 2020-11-20 NOTE — Progress Notes (Signed)
Established Patient Office Visit     This visit occurred during the SARS-CoV-2 public health emergency.  Safety protocols were in place, including screening questions prior to the visit, additional usage of staff PPE, and extensive cleaning of exam room while observing appropriate contact time as indicated for disinfecting solutions.    CC/Reason for Visit: Right lower jaw pain, follow-up hypertension  HPI: Samantha Delacruz is a 55 y.o. female who is coming in today for the above mentioned reasons.  She was started on amlodipine 5 mg daily on March 23.  Since I last saw her she has visited her GYN and has again been diagnosed with herpes and is now taking Valtrex.  She has also been prescribed vaginal estrogen for postmenopausal dryness.  She woke up this morning with right lower side jaw pain.  She has never had this before.  She was concerned she may be having a heart attack so scheduled this acute visit today.  She has some bony growths in her mandible.  She tells me she has had the removed before.  They have grown in size.  Past Medical/Surgical History: Past Medical History:  Diagnosis Date  . Adult ADHD   . Anemia    history of  . Asthma   . Headache    Migraines  . Heart palpitations   . Hemorrhoids, internal, with bleeding and mucous discharge - Grade 1 02/05/2014  . Herpes   . Migraines   . Pneumonia   . Restless leg   . Rosacea   . Shingles   . Vertigo     Past Surgical History:  Procedure Laterality Date  . CESAREAN SECTION  08/15/92   son  . DILATATION & CURETTAGE/HYSTEROSCOPY WITH MYOSURE N/A 04/19/2016   Procedure: DILATATION & CURETTAGE/HYSTEROSCOPY WITH MYOSURE;  Surgeon: Anastasio Auerbach, MD;  Location: [facility];  Service: Gynecology;  Laterality: N/A;  request 7:30am start time   Requests one hour OR time.   . thrush      Social History:  reports that she has never smoked. She has never used smokeless tobacco. She reports previous alcohol use. She  reports that she does not use drugs.  Allergies: Allergies  Allergen Reactions  . Latex Rash  . Green Dyes Hives    Green, yellow, blue  . Influenza Virus Vaccine Nausea And Vomiting  . Codeine Hives  . Lactose Intolerance (Gi) Diarrhea    cramping  . Mobic [Meloxicam]   . Sulfa Antibiotics Hives    Family History:  Family History  Problem Relation Age of Onset  . Depression Mother   . Diabetes Mother   . Emphysema Mother   . Stroke Mother   . Cancer Mother        uterine   . Heart disease Father   . Heart failure Father   . Thyroid disease Sister   . Thyroid disease Brother   . ADD / ADHD Son   . Stroke Maternal Grandmother   . Cancer Paternal Grandfather        esophgus   . Colon cancer Neg Hx      Current Outpatient Medications:  .  albuterol (PROAIR HFA) 108 (90 Base) MCG/ACT inhaler, INHALE 2 PUFFS EVERY 4 HOURS AS NEEDED FOR WHEEZING OR SHORTNESS OF BREATH (Patient taking differently: Inhale 2 puffs into the lungs every 4 (four) hours as needed for wheezing or shortness of breath.), Disp: 8.5 Inhaler, Rfl: 2 .  amLODipine (NORVASC) 5 MG tablet, Take  1 tablet (5 mg total) by mouth daily., Disp: 90 tablet, Rfl: 1 .  Chlorpheniramine Maleate (ALLERGY PO), Take by mouth., Disp: , Rfl:  .  estradiol (ESTRACE) 0.1 MG/GM vaginal cream, 1 gram vaginally twice weekly, Disp: 42.5 g, Rfl: 0 .  fluticasone (FLONASE) 50 MCG/ACT nasal spray, USE 1 SPRAY IN EACH NOSTRIL DAILY, Disp: 16 g, Rfl: 0 .  fluticasone-salmeterol (ADVAIR HFA) 230-21 MCG/ACT inhaler, TAKE 2 PUFFS BY MOUTH TWICE A DAY (Patient taking differently: Inhale 2 puffs into the lungs 2 (two) times daily. TAKE 2 PUFFS BY MOUTH TWICE A DAY), Disp: 24 Inhaler, Rfl: 0 .  Ginger, Zingiber officinalis, (GINGER PO), Take by mouth., Disp: , Rfl:  .  lidocaine (XYLOCAINE) 5 % ointment, Apply 1 application topically 3 (three) times daily., Disp: 50 g, Rfl: 0 .  TURMERIC PO, Take by mouth., Disp: , Rfl:  .  valACYclovir  (VALTREX) 500 MG tablet, Take 1 tablet (500 mg total) by mouth daily. (Patient taking differently: Take 500 mg by mouth. 2 tablets day), Disp: 30 tablet, Rfl: 5  Review of Systems:  Constitutional: Denies fever, chills, diaphoresis, appetite change and fatigue.  HEENT: Denies photophobia, eye pain, redness, hearing loss, ear pain, congestion, sore throat, rhinorrhea, sneezing, mouth sores, trouble swallowing, neck pain, neck stiffness and tinnitus.   Respiratory: Denies SOB, DOE, cough, chest tightness,  and wheezing.   Cardiovascular: Denies chest pain, palpitations and leg swelling.  Gastrointestinal: Denies nausea, vomiting, abdominal pain, diarrhea, constipation, blood in stool and abdominal distention.  Genitourinary: Denies dysuria, urgency, frequency, hematuria, flank pain and difficulty urinating.  Endocrine: Denies: hot or cold intolerance, sweats, changes in hair or nails, polyuria, polydipsia. Musculoskeletal: Denies myalgias, back pain, joint swelling, arthralgias and gait problem.  Skin: Denies pallor, rash and wound.  Neurological: Denies dizziness, seizures, syncope, weakness, light-headedness, numbness and headaches.  Hematological: Denies adenopathy. Easy bruising, personal or family bleeding history  Psychiatric/Behavioral: Denies suicidal ideation, mood changes, confusion, nervousness, sleep disturbance and agitation    Physical Exam: Vitals:   11/20/20 1511  BP: 120/88  Pulse: 89  Temp: 98.3 F (36.8 C)  TempSrc: Oral  SpO2: 97%  Weight: 180 lb 9.6 oz (81.9 kg)    Body mass index is 30.05 kg/m.   Constitutional: NAD, calm, comfortable Eyes: PERRL, lids and conjunctivae normal ENMT: Mucous membranes are moist. Posterior pharynx clear of any exudate or lesions.  Bony growths around her mandible underneath her tongue and on the side. Neurologic: Grossly intact and nonfocal Psychiatric: Normal judgment and insight. Alert and oriented x 3. Normal mood.     Impression and Plan:  Primary hypertension -Blood pressures well controlled on amlodipine.  Pain in upper jaw -Reassured, doubt cardiac etiology.  No chest pain, shortness of breath, palpitations or any other symptoms. -Have advised that she follow-up with her dentist/oral surgeon in regard to these bone growths in her mandible.  I wonder if these could be contributing.  No obvious cavities or tooth abscesses visible today.    Lelon Frohlich, MD [facility]

## 2020-11-20 NOTE — Progress Notes (Signed)
Attempted to call patient, but unable to leave message r/t voice mail not set up yet. 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 Urine culture also negative (no bladder infection). Since all of that is ruled out, I believe your acute symptoms were caused by an outbreak of herpes. In addition to suppressive therapy with Valtrex, I think she would also benefit from vaginal estrogen to help Genitourinary Syndrome of menopause (information was provided in after visit summary). I will send in a prescription ([place]). Please advise patient to use twice weekly (insert vaginally), and should notice benefit with in a month. Have her follow up for her physical exam and follow up (with any provider) when school is out. Thank you.

## 2020-11-24 ENCOUNTER — Telehealth: Payer: Self-pay | Admitting: *Deleted

## 2020-11-24 NOTE — Telephone Encounter (Signed)
Message left to return call to Heath Badon at [phone].   

## 2020-11-24 NOTE — Telephone Encounter (Signed)
-----   Message from Karma Ganja, NP sent at 11/20/2020  8:30 AM EDT ----- Attempted to call patient, but unable to leave message r/t voice mail not set up yet. 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 Urine culture also negative (no bladder infection). Since all of that is ruled out, I believe your acute symptoms were caused by an outbreak of herpes. In addition to suppressive therapy with Valtrex, I think she would also benefit from vaginal estrogen to help Genitourinary Syndrome of menopause (information was provided in after visit summary). I will send in a prescription ([place]). Please advise patient to use twice weekly (insert vaginally), and should notice benefit with in a month. Have her follow up for her physical exam and follow up (with any provider) when school is out. Thank you.

## 2020-12-07 ENCOUNTER — Telehealth: Payer: Self-pay | Admitting: *Deleted

## 2020-12-07 NOTE — Telephone Encounter (Signed)
Patient called stating her medication Valtrex is not helping with her herpes outbreak. Patient would like a increase in dose or any other recommendations to help her with her Herpes outbreak. I will send this to Karma Ganja NP for advice/recommendations.

## 2020-12-08 NOTE — Telephone Encounter (Signed)
Karma Ganja, NP  You 17 hours ago (4:24 PM)   KD  Spoke to patient. Does not sound like HSV outbreak, sounds more like canker sores. Would recommend staying on the same Valtrex dose for suppression.   Routing comment      Will close encounter. Per Karma Ganja NP note above she has spoke with patient regarding this matter.

## 2020-12-14 ENCOUNTER — Ambulatory Visit (AMBULATORY_SURGERY_CENTER): Payer: BC Managed Care – PPO | Admitting: Internal Medicine

## 2020-12-14 ENCOUNTER — Other Ambulatory Visit: Payer: Self-pay

## 2020-12-14 ENCOUNTER — Encounter: Payer: Self-pay | Admitting: Internal Medicine

## 2020-12-14 VITALS — BP 124/81 | HR 75 | Temp 97.3°F | Resp 13 | Ht 65.0 in | Wt 181.0 lb

## 2020-12-14 DIAGNOSIS — K5730 Diverticulosis of large intestine without perforation or abscess without bleeding: Secondary | ICD-10-CM | POA: Diagnosis not present

## 2020-12-14 DIAGNOSIS — K648 Other hemorrhoids: Secondary | ICD-10-CM

## 2020-12-14 DIAGNOSIS — K625 Hemorrhage of anus and rectum: Secondary | ICD-10-CM | POA: Diagnosis present

## 2020-12-14 MED ORDER — SODIUM CHLORIDE 0.9 % IV SOLN: 500.0000 mL | Freq: Once | INTRAVENOUS | Status: DC

## 2020-12-14 NOTE — Progress Notes (Signed)
PT taken to PACU. Monitors in place. VSS. Report given to RN. 

## 2020-12-14 NOTE — Progress Notes (Signed)
Vs by s Monday RN

## 2020-12-14 NOTE — Patient Instructions (Addendum)
Hemorrhoids but no polyps or cancer.  You also have a condition called diverticulosis - common and not usually a problem. Please read the handout provided.   Next routine colonoscopy or other screening test in 10 years - 2032.  I appreciate the opportunity to care for you. Gatha Mayer, MD, [facility]  Hemorrhoid and diverticulosis handouts given to patient.  Resume previous diet. Continue present medications. Repeat colonoscopy in 10 years for screening purposes.  YOU HAD AN ENDOSCOPIC PROCEDURE TODAY AT [facility]:   Refer to the procedure report that was given to you for any specific questions about what was found during the examination.  If the procedure report does not answer your questions, please call your gastroenterologist to clarify.  If you requested that your care partner not be given the details of your procedure findings, then the procedure report has been included in a sealed envelope for you to review at your convenience later.  YOU SHOULD EXPECT: Some feelings of bloating in the abdomen. Passage of more gas than usual.  Walking can help get rid of the air that was put into your GI tract during the procedure and reduce the bloating. If you had a lower endoscopy (such as a colonoscopy or flexible sigmoidoscopy) you may notice spotting of blood in your stool or on the toilet paper. If you underwent a bowel prep for your procedure, you may not have a normal bowel movement for a few days.  Please Note:  You might notice some irritation and congestion in your nose or some drainage.  This is from the oxygen used during your procedure.  There is no need for concern and it should clear up in a day or so.  SYMPTOMS TO REPORT IMMEDIATELY:   Following lower endoscopy (colonoscopy or flexible sigmoidoscopy):  Excessive amounts of blood in the stool  Significant tenderness or worsening of abdominal pains  Swelling of the abdomen that is new, acute  Fever of 100F or  higher For urgent or emergent issues, a gastroenterologist can be reached at any hour by calling [phone]. Do not use MyChart messaging for urgent concerns.    DIET:  We do recommend a small meal at first, but then you may proceed to your regular diet.  Drink plenty of fluids but you should avoid alcoholic beverages for 24 hours.  ACTIVITY:  You should plan to take it easy for the rest of today and you should NOT DRIVE or use heavy machinery until tomorrow (because of the sedation medicines used during the test).    FOLLOW UP: Our staff will call the number listed on your records 48-72 hours following your procedure to check on you and address any questions or concerns that you may have regarding the information given to you following your procedure. If we do not reach you, we will leave a message.  We will attempt to reach you two times.  During this call, we will ask if you have developed any symptoms of COVID 19. If you develop any symptoms (ie: fever, flu-like symptoms, shortness of breath, cough etc.) before then, please call [phone].  If you test positive for Covid 19 in the 2 weeks post procedure, please call and report this information to [place].    If any biopsies were taken you will be contacted by phone or by letter within the next 1-3 weeks.  Please call us at [phone] if you have not heard about the biopsies in 3 weeks.  SIGNATURES/CONFIDENTIALITY: You and/or your care partner have signed paperwork which will be entered into your electronic medical record.  These signatures attest to the fact that that the information above on your After Visit Summary has been reviewed and is understood.  Full responsibility of the confidentiality of this discharge information lies with you and/or your care-partner.

## 2020-12-14 NOTE — Op Note (Signed)
[facility] Patient Name: Samantha Delacruz Procedure Date: 12/14/2020 2:37 PM MRN: [id number] Endoscopist: Gatha Mayer , MD Age: 55 Referring MD:  Date of Birth: [birth year] Gender: Female Account #: [id number] Procedure:                Colonoscopy Indications:              Rectal bleeding Medicines:                Propofol per Anesthesia, Monitored Anesthesia Care Procedure:                Pre-Anesthesia Assessment:                           - Prior to the procedure, a History and Physical                            was performed, and patient medications and                            allergies were reviewed. The patient's tolerance of                            previous anesthesia was also reviewed. The risks                            and benefits of the procedure and the sedation                            options and risks were discussed with the patient.                            All questions were answered, and informed consent                            was obtained. Prior Anticoagulants: The patient has                            taken no previous anticoagulant or antiplatelet                            agents. ASA Grade Assessment: II - A patient with                            mild systemic disease. After reviewing the risks                            and benefits, the patient was deemed in                            satisfactory condition to undergo the procedure.                           After obtaining informed consent, the colonoscope  was passed under direct vision. Throughout the                            procedure, the patient's blood pressure, pulse, and                            oxygen saturations were monitored continuously. The                            Olympus PCF-H190DL (#[id number]) Colonoscope was                            introduced through the anus and advanced to the the                            cecum, identified  by appendiceal orifice and                            ileocecal valve. The colonoscopy was performed                            without difficulty. The patient tolerated the                            procedure well. The quality of the bowel                            preparation was excellent. The ileocecal valve,                            appendiceal orifice, and rectum were photographed. Scope In: 3:02:41 PM Scope Out: 3:18:14 PM Scope Withdrawal Time: 0 hours 9 minutes 2 seconds  Total Procedure Duration: 0 hours 15 minutes 33 seconds  Findings:                 The perianal and digital rectal examinations were                            normal.                           External hemorrhoids were found.                           A few diverticula were found in the sigmoid colon.                           The exam was otherwise without abnormality on                            direct and retroflexion views. Complications:            No immediate complications. Estimated Blood Loss:     Estimated blood loss: none. Impression:               - External hemorrhoids. Cause of prior bleeding.                           -  Diverticulosis in the sigmoid colon. Few.                           - The examination was otherwise normal on direct                            and retroflexion views.                           - No specimens collected. Recommendation:           - Patient has a contact number available for                            emergencies. The signs and symptoms of potential                            delayed complications were discussed with the                            patient. Return to normal activities tomorrow.                            Written discharge instructions were provided to the                            patient.                           - Resume previous diet.                           - Continue present medications.                           - Repeat colonoscopy  in 10 years for screening                            purposes. Gatha Mayer, MD 12/14/2020 3:22:53 PM This report has been signed electronically.

## 2020-12-16 ENCOUNTER — Telehealth: Payer: Self-pay | Admitting: *Deleted

## 2020-12-16 NOTE — Telephone Encounter (Signed)
  Follow up Call-  Call back number 12/14/2020  Post procedure Call Back phone  # [phone]  Permission to leave phone message Yes  comments no answering machine  Some recent data might be hidden     Patient questions:  Do you have a fever, pain , or abdominal swelling? No. Pain Score  0 *  Have you tolerated food without any problems? Yes.    Have you been able to return to your normal activities? Yes.    Do you have any questions about your discharge instructions: Diet   No. Medications  No. Follow up visit  No.  Do you have questions or concerns about your Care? No.  Actions: * If pain score is 4 or above: No action needed, pain <4.  1. Have you developed a fever since your procedure? no  2.   Have you had an respiratory symptoms (SOB or cough) since your procedure? no  3.   Have you tested positive for COVID 19 since your procedure no  4.   Have you had any family members/close contacts diagnosed with the COVID 19 since your procedure?  no   If yes to any of these questions please route to Joylene John, RN and Joella Prince, RN

## 2020-12-25 ENCOUNTER — Ambulatory Visit
Admission: RE | Admit: 2020-12-25 | Discharge: 2020-12-25 | Disposition: A | Discharge status: Home / Self Care | Payer: BC Managed Care – PPO | Source: Ambulatory Visit | Attending: Internal Medicine | Admitting: Internal Medicine

## 2020-12-25 ENCOUNTER — Other Ambulatory Visit: Payer: Self-pay | Admitting: Internal Medicine

## 2020-12-25 ENCOUNTER — Other Ambulatory Visit: Payer: Self-pay

## 2020-12-25 DIAGNOSIS — N6320 Unspecified lump in the left breast, unspecified quadrant: Secondary | ICD-10-CM

## 2021-01-04 ENCOUNTER — Telehealth: Payer: Self-pay

## 2021-01-04 ENCOUNTER — Other Ambulatory Visit: Payer: Self-pay

## 2021-01-04 ENCOUNTER — Ambulatory Visit: Payer: BC Managed Care – PPO | Admitting: Allergy

## 2021-01-04 ENCOUNTER — Encounter: Payer: Self-pay | Admitting: Allergy

## 2021-01-04 VITALS — BP 130/82 | HR 85 | Temp 98.4°F | Resp 18 | Ht 65.0 in | Wt 186.6 lb

## 2021-01-04 DIAGNOSIS — T781XXD Other adverse food reactions, not elsewhere classified, subsequent encounter: Secondary | ICD-10-CM

## 2021-01-04 DIAGNOSIS — J3089 Other allergic rhinitis: Secondary | ICD-10-CM | POA: Diagnosis not present

## 2021-01-04 DIAGNOSIS — J45909 Unspecified asthma, uncomplicated: Secondary | ICD-10-CM | POA: Diagnosis not present

## 2021-01-04 DIAGNOSIS — T50995D Adverse effect of other drugs, medicaments and biological substances, subsequent encounter: Secondary | ICD-10-CM

## 2021-01-04 MED ORDER — EPINEPHRINE 0.3 MG/0.3ML IJ SOAJ: 0.3000 mg | INTRAMUSCULAR | 1 refills | Status: DC | PRN

## 2021-01-04 NOTE — Assessment & Plan Note (Signed)
Diagnosed with asthma a few years ago however is not taking any daily maintenance inhalers regularly at this time.  Only using albuterol and Advair as needed.  Today's spirometry was normal. Daily controller medication(s): none During upper respiratory infections/asthma flares: start Advair 270mcg 1 puff twice a day and rinse mouth after each use for 1-2 weeks until your breathing symptoms return to baseline.  May use albuterol rescue inhaler 2 puffs every 4 to 6 hours as needed for shortness of breath, chest tightness, coughing, and wheezing. May use albuterol rescue inhaler 2 puffs 5 to 15 minutes prior to strenuous physical activities. Monitor frequency of use.

## 2021-01-04 NOTE — Assessment & Plan Note (Signed)
Skin testing at outside allergist was positive to cats, feathers and dust mites per patient report.  Requesting records.  See below for environmental control measures.  Use over the counter antihistamines such as Zyrtec (cetirizine), Claritin (loratadine), Allegra (fexofenadine), or Xyzal (levocetirizine) daily as needed. May take twice a day during allergy flares. May switch antihistamines every few months.

## 2021-01-04 NOTE — Assessment & Plan Note (Signed)
Broke out in whole body pruritic rash/hives 20 minutes after eating green [employer] doughnut on [employer] Day.  She was seen in the ER twice and was treated with antihistamines and systemic steroids with good benefit.  Previously had a reaction to green beer.  Does not recall any issues with other food dyes.  Currently trying to avoid all food dyes.  Discussed with patient that we don't have skin prick testing available for food dyes and the only bloodwork available is for red and yellow dye.  Keep track of episodes and take pictures.   Avoid food dyes.  I have prescribed epinephrine injectable and demonstrated proper use. For mild symptoms you can take over the counter antihistamines such as Benadryl and monitor symptoms closely. If symptoms worsen or if you have severe symptoms including breathing issues, throat closure, significant swelling, whole body hives, severe diarrhea and vomiting, lightheadedness then inject epinephrine and seek immediate medical care afterwards.  Action plan given.  . Get bloodwork.

## 2021-01-04 NOTE — Patient Instructions (Addendum)
Will request records.   Hives:  Keep track of episodes and take pictures.   Avoid food dyes.  I have prescribed epinephrine injectable and demonstrated proper use. For mild symptoms you can take over the counter antihistamines such as Benadryl and monitor symptoms closely. If symptoms worsen or if you have severe symptoms including breathing issues, throat closure, significant swelling, whole body hives, severe diarrhea and vomiting, lightheadedness then inject epinephrine and seek immediate medical care afterwards.  Action plan given.  . Get bloodwork:  o We are ordering labs, so please allow 1-2 weeks for the results to come back. o With the newly implemented Cures Act, the labs might be visible to you at the same time that they become visible to me. However, I will not address the results until all of the results are back, so please be patient.  o In the meantime, continue recommendations in your patient instructions, including avoidance measures (if applicable), until you hear from me.  Environmental allergies  See below for environmental control measures.  Use over the counter antihistamines such as Zyrtec (cetirizine), Claritin (loratadine), Allegra (fexofenadine), or Xyzal (levocetirizine) daily as needed. May take twice a day during allergy flares. May switch antihistamines every few months.  Asthma: Normal breathing test today. Daily controller medication(s): none During upper respiratory infections/asthma flares: start Advair 268mcg 1 puff twice a day and rinse mouth after each use for 1-2 weeks until your breathing symptoms return to baseline.  May use albuterol rescue inhaler 2 puffs every 4 to 6 hours as needed for shortness of breath, chest tightness, coughing, and wheezing. May use albuterol rescue inhaler 2 puffs 5 to 15 minutes prior to strenuous physical activities. Monitor frequency of use.  Asthma control goals:  Full participation in all desired activities (may need  albuterol before activity) Albuterol use two times or less a week on average (not counting use with activity) Cough interfering with sleep two times or less a month Oral steroids no more than once a year No hospitalizations  Drug allergies  Continue to avoid latex, codeine, sulfa.   Follow up in 6 months or sooner if needed.   Pet Allergen Avoidance: . Contrary to popular opinion, there are no "hypoallergenic" breeds of dogs or cats. That is because people are not allergic to an animal's hair, but to an allergen found in the animal's saliva, dander (dead skin flakes) or urine. Pet allergy symptoms typically occur within minutes. For some people, symptoms can build up and become most severe 8 to 12 hours after contact with the animal. People with severe allergies can experience reactions in public places if dander has been transported on the pet owners' clothing. Marland Kitchen Keeping an animal outdoors is only a partial solution, since homes with pets in the yard still have higher concentrations of animal allergens. . Before getting a pet, ask your allergist to determine if you are allergic to animals. If your pet is already considered part of your family, try to minimize contact and keep the pet out of the bedroom and other rooms where you spend a great deal of time. . As with dust mites, vacuum carpets often or replace carpet with a hardwood floor, tile or linoleum. . High-efficiency particulate air (HEPA) cleaners can reduce allergen levels over time. . While dander and saliva are the source of cat and dog allergens, urine is the source of allergens from rabbits, hamsters, mice and [place] pigs; so ask a non-allergic family member to clean the animal's cage. Marland Kitchen  If you have a pet allergy, talk to your allergist about the potential for allergy immunotherapy (allergy shots). This strategy can often provide long-term relief.  Control of House Dust Mite Allergen . Dust mite allergens are a common trigger of  allergy and asthma symptoms. While they can be found throughout the house, these microscopic creatures thrive in warm, humid environments such as bedding, upholstered furniture and carpeting. . Because so much time is spent in the bedroom, it is essential to reduce mite levels there.  . Encase pillows, mattresses, and box springs in special allergen-proof fabric covers or airtight, zippered plastic covers.  . Bedding should be washed weekly in hot water (130 F) and dried in a hot dryer. Allergen-proof covers are available for comforters and pillows that can't be regularly washed.  Wendee Copp the allergy-proof covers every few months. Minimize clutter in the bedroom. Keep pets out of the bedroom.  Marland Kitchen Keep humidity less than 50% by using a dehumidifier or air conditioning. You can buy a humidity measuring device called a hygrometer to monitor this.  . If possible, replace carpets with hardwood, linoleum, or washable area rugs. If that's not possible, vacuum frequently with a vacuum that has a HEPA filter. . Remove all upholstered furniture and non-washable window drapes from the bedroom. . Remove all non-washable stuffed toys from the bedroom.  Wash stuffed toys weekly.

## 2021-01-04 NOTE — Telephone Encounter (Signed)
I called the patient to find out which [facility] she used to go to. Patient stated she went to their [address] [place], [place], [place] [address] office. I faxed over disclosure agreement form to their office.

## 2021-01-04 NOTE — Progress Notes (Signed)
New Patient Note  RE: Samantha Delacruz MRN: [id number] DOB: [birth year] Date of Office Visit: 01/04/2021  Consult requested by: Samantha Delacruz, [facility]* Primary care provider: Isaac Delacruz, Samantha Halsted, MD  Chief Complaint: Urticaria Martin Majestic to the emergency room twice due to hives- all over the body was triggered by green dye she is allergic to it. Has a traumatic brain injury and forgot she was allergic to green dye. The second time she was on prednisone,allergy medication, and the hives were constant. ) and Allergic Reaction (Allergic to cats, feathers, and green dye )  History of Present Illness: I had the pleasure of seeing Samantha Delacruz for initial evaluation at the [facility] on 01/04/2021. She is a 55 y.o. female, who is referred here by Samantha Delacruz, Samantha Halsted, MD for the evaluation of allergies.  Patient had a green [employer] on St. Patrick's day. About 20 minutes afterwards she had some itching and hives all over her body. She took benadryl at home and then went to PCP who gave her prednisone which did not help. Then she ended up going to the ER. The hives resolved completely after a few weeks.   No ecchymosis upon resolution. Associated symptoms include: shortness of breath. Suspected triggers are green dye in [employer] doughnut. Denies any fevers, changes in medications, personal care products or recent infections. She has tried the following therapies: antihistamines, prednisone with good benefit. Systemic steroids yes. Currently on no daily medications.  Previous work up includes: a few years ago had similar hives after drinking green beer as well.  She had skin testing at that time which was positive to cats, feathers, and dust mites per patient report.  . Previous history of rash/hives: no. Patient is up to date with the following cancer screening tests: colonoscopy, physical exam, mammogram, pap smears.  Dietary History: patient has  been eating other foods including milk (lactose intolerant), eggs, peanut, treenuts, sesame, shellfish, fish, soy, wheat, meats, fruits and vegetables.  She reports reading labels and avoiding green dye in diet completely.  Currently trying to avoid all dyes now. She is not aware of any specific incidents after eating yellow, blue or red dye containing foods.   11/04/2020 PCP visit: "HPI: Samantha Delacruz is a 55 y.o. female who is coming in today for the above mentioned reasons.  I saw her in the office last week for acute onset of full body hives following ingestion of green food coloring for [employer]. Patrick's Day.  She has had a traumatic brain injury and had forgotten that she had had a reaction to green food dye in the past.  She was given a Medrol injection as well as a prednisone taper and was counseled to use Benadryl and Claritin daily.  She had to go to the emergency department due to worsening of the hives.  Pictures from both my visit and the ED visit are in the chart.  Hives where quite impressive during her ED follow-up.  She was given more steroids.  She still has 3 days remaining of her prednisone taper.  She has noticed throughout this whole ordeal that her blood pressure has been elevated.  It is 160/100 in office today.  She has not had any headache, chest pain, shortness of breath or focal neurologic deficit."  11/01/2020 ER visit: " Patient presents ER for evaluation recurrent urticaria.  Patient states she initially started having a reaction after having green beer on March 17.  Patient forgot that she did have an allergy to green food dye.  She ended up going to the emergency room on the 19th.  She was given Benadryl and antihistamines as well as steroids.  Patient states she noticed some improvement after that.  However today the symptoms returned and were more severe again.  She is having diffuse rashes and itching.  She is not having any difficulty breathing or speaking.  Patient  thinks she was exposed to some allergens at home including cat here that may have triggered this again.  He has tried all the medications that she was prescribed and she still is very uncomfortable and itchy."  10/31/2020 ER visit: "Samantha Delacruz is a 55 y.o. female who ate a doughnut containing green dye (in honor of [employer]. Patrick's Day) 2 days ago.  Within 20 minutes she developed generalized itching and hives.  The hives have worsened and they are now severe.  They are generalized.  She has been taking Benadryl, last dose was 50 mg about 11 PM yesterday evening which gave her for relief for only about 2 hours.  She has not had throat swelling, difficulty breathing, nausea, vomiting or diarrhea.  She was given a prescription for prednisone but has not yet started taking it.  Patient has a history of a traumatic brain injury and did not recall that she previously had an allergic reaction to green dye in beer several years ago.  A friend reminded her of this."  Assessment and Plan: [name] is a 55 y.o. female with: Other adverse food reactions, not elsewhere classified, subsequent encounter Broke out in whole body pruritic rash/hives 20 minutes after eating green [employer] doughnut on [employer] Patrick's Day.  She was seen in the ER twice and was treated with antihistamines and systemic steroids with good benefit.  Previously had a reaction to green beer.  Does not recall any issues with other food dyes.  Currently trying to avoid all food dyes.  Discussed with patient that we don't have skin prick testing available for food dyes and the only bloodwork available is for red and yellow dye.  Keep track of episodes and take pictures.   Avoid food dyes.  I have prescribed epinephrine injectable and demonstrated proper use. For mild symptoms you can take over the counter antihistamines such as Benadryl and monitor symptoms closely. If symptoms worsen or if you have severe symptoms including breathing  issues, throat closure, significant swelling, whole body hives, severe diarrhea and vomiting, lightheadedness then inject epinephrine and seek immediate medical care afterwards.  Action plan given.  . Get bloodwork.  Other allergic rhinitis Skin testing at outside allergist was positive to cats, feathers and dust mites per patient report.  Requesting records.  See below for environmental control measures.  Use over the counter antihistamines such as Zyrtec (cetirizine), Claritin (loratadine), Allegra (fexofenadine), or Xyzal (levocetirizine) daily as needed. May take twice a day during allergy flares. May switch antihistamines every few months.  Asthma Diagnosed with asthma a few years ago however is not taking any daily maintenance inhalers regularly at this time.  Only using albuterol and Advair as needed.  Today's spirometry was normal. Daily controller medication(s): none During upper respiratory infections/asthma flares: start Advair 226mcg 1 puff twice a day and rinse mouth after each use for 1-2 weeks until your breathing symptoms return to baseline.  May use albuterol rescue inhaler 2 puffs every 4 to 6 hours as needed for shortness of breath, chest tightness, coughing,  and wheezing. May use albuterol rescue inhaler 2 puffs 5 to 15 minutes prior to strenuous physical activities. Monitor frequency of use.   Adverse effect of other drugs, medicaments and biological substances, subsequent encounter  Continue to avoid latex, codeine, sulfa.   Get bloodwork for latex IgE.    Return in about 6 months (around 07/07/2021).  Meds ordered this encounter  Medications  . EPINEPHrine (AUVI-Q) 0.3 mg/0.3 mL IJ SOAJ injection    Sig: Inject 0.3 mg into the muscle as needed for anaphylaxis.    Dispense:  1 each    Refill:  1    Lab Orders     Allergen, Red (Carmine) Dye, [id number]     ALLERGEN, ANNATTO SEED     Tryptase     Latex, IgE  Other allergy screening: Asthma:  Using  albuterol as needed and Advair as needed.   Rhino conjunctivitis: yes  Sneezing and coughing and does not like to use antihistamines daily because it causes drowsiness.  Patient had retinal peel surgery.  Not using any nasal sprays.   Medication allergy: yes Hymenoptera allergy: no Eczema:no History of recurrent infections suggestive of immunodeficency: no  Diagnostics: Spirometry:  Tracings reviewed. Her effort: Good reproducible efforts. FVC: 3.89L FEV1: 2.84L, 99% predicted FEV1/FVC ratio: 73% Interpretation: Spirometry consistent with normal pattern.  Please see scanned spirometry results for details.  Past Medical History: Patient Active Problem List   Diagnosis Date Noted  . Other adverse food reactions, not elsewhere classified, subsequent encounter 01/04/2021  . Adverse effect of other drugs, medicaments and biological substances, subsequent encounter 01/04/2021  . Asthma 01/04/2021  . HTN (hypertension) 05/14/2019  . Right hip pain 08/21/2014  . Palpitations 08/04/2014  . 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 Rectal bleeding 12/10/2013  . Other allergic rhinitis 09/26/2013  . Benign paroxysmal positional vertigo 06/20/2013  . Seborrheic keratosis, inflamed 05/23/2013  . Fibrocystic breast changes 10/02/2012  . Overweight 10/02/2012   Past Medical History:  Diagnosis Date  . Adult ADHD   . Anemia    history of  . Angio-edema   . Asthma   . Brain injury ([place])   . Headache    Migraines  . Heart palpitations   . Hemorrhoids, internal, with bleeding and mucous discharge - Grade 1 02/05/2014  . Herpes   . Hypertension   . Migraines   . Pneumonia   . Restless leg   . Rosacea   . Shingles   . Urticaria   . Vertigo    Past Surgical History: Past Surgical History:  Procedure Laterality Date  . CESAREAN SECTION  08/15/92   son  . COLONOSCOPY    . DILATATION & CURETTAGE/HYSTEROSCOPY WITH MYOSURE N/A 04/19/2016   Procedure: DILATATION & CURETTAGE/HYSTEROSCOPY WITH  MYOSURE;  Surgeon: Anastasio Auerbach, MD;  Location: [facility];  Service: Gynecology;  Laterality: N/A;  request 7:30am start time   Requests one hour OR time.   . retinal peel left eye surgery    . thrush     Medication List:  Current Outpatient Medications  Medication Sig Dispense Refill  . albuterol (PROAIR HFA) 108 (90 Base) MCG/ACT inhaler INHALE 2 PUFFS EVERY 4 HOURS AS NEEDED FOR WHEEZING OR SHORTNESS OF BREATH (Patient taking differently: Inhale 2 puffs into the lungs every 4 (four) hours as needed for wheezing or shortness of breath.) 8.5 Inhaler 2  . amLODipine (NORVASC) 5 MG tablet Take 1 tablet (5 mg total) by mouth daily. 90 tablet 1  . Chlorpheniramine Maleate (ALLERGY  PO) Take by mouth.    . EPINEPHrine (AUVI-Q) 0.3 mg/0.3 mL IJ SOAJ injection Inject 0.3 mg into the muscle as needed for anaphylaxis. 1 each 1  . estradiol (ESTRACE) 0.1 MG/GM vaginal cream 1 gram vaginally twice weekly 42.5 g 0  . fluticasone (FLONASE) 50 MCG/ACT nasal spray USE 1 SPRAY IN EACH NOSTRIL DAILY 16 g 0  . fluticasone-salmeterol (ADVAIR HFA) 230-21 MCG/ACT inhaler TAKE 2 PUFFS BY MOUTH TWICE A DAY 24 Inhaler 0  . Ginger, Zingiber officinalis, (GINGER PO) Take by mouth.    . lidocaine (XYLOCAINE) 5 % ointment Apply 1 application topically 3 (three) times daily. 50 g 0  . TURMERIC PO Take by mouth.    . valACYclovir (VALTREX) 500 MG tablet Take 1 tablet (500 mg total) by mouth daily. (Patient taking differently: Take 500 mg by mouth. 2 tablets day) 30 tablet 5   Current Facility-Administered Medications  Medication Dose Route Frequency Provider Last Rate Last Admin  . 0.9 %  sodium chloride infusion  500 mL Intravenous Once Gatha Mayer, MD       Allergies: Allergies  Allergen Reactions  . Latex Rash  . Green Dyes Hives    Green, yellow, blue  . Influenza Virus Vaccine Nausea And Vomiting  . Codeine Hives  . Lactose Intolerance (Gi) Diarrhea    cramping  . Mobic [Meloxicam]   . Sulfa  Antibiotics Hives   Social History: Social History   Socioeconomic History  . Marital status: Divorced    Spouse name: Not on file  . Number of children: 1  . Years of education: Not on file  . Highest education level: Not on file  Occupational History    Employer: [employer]  Tobacco Use  . Smoking status: Never Smoker  . Smokeless tobacco: Never Used  Vaping Use  . Vaping Use: Never used  Substance and Sexual Activity  . Alcohol use: Not Currently    Alcohol/week: 0.0 standard drinks  . Drug use: No  . Sexual activity: Yes    Partners: Male    Birth control/protection: Post-menopausal  Other Topics Concern  . 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 Smoking: denies Occupation: Higher education careers adviser HistoryFreight forwarder in the house: no Charity fundraiser in the family room: no Carpet in the bedroom: no Heating: gas Cooling: central Pet: yes 1 cat x 10 yrs  Family History: Family History  Problem Relation Age of Onset  . Depression Mother   . Diabetes Mother   . Emphysema Mother   . Stroke Mother   . Cancer Mother        uterine   . Heart disease Father   . Heart failure Father   . Thyroid disease Sister   . Thyroid disease Brother   . ADD / ADHD Son   . Stroke Maternal Grandmother   . Cancer Paternal Grandfather        esophgus   . Colon cancer Neg Hx  Problem                               Relation Asthma                                   No  Eczema                                No Food allergy                           Brother   Allergic rhino conjunctivitis      Brother   Review of Systems  Constitutional: Negative for appetite change, chills, fever and unexpected weight change.  HENT: Positive for postnasal drip. Negative for congestion and rhinorrhea.   Eyes: Negative for itching.  Respiratory: Negative for cough, chest tightness, shortness of breath and wheezing.   Cardiovascular: Negative for chest pain.  Gastrointestinal: Negative for abdominal pain.  Genitourinary: Negative for difficulty urinating.  Skin: Negative for rash.  Allergic/Immunologic: Positive for environmental allergies.  Neurological: Negative for headaches.   Objective: BP 130/82   Pulse 85   Temp 98.4 F (36.9 C)   Resp 18   Ht 5\' 5"  (1.651 m)   Wt 186 lb 9.6 oz (84.6 kg)   LMP 07/01/2017   SpO2 97%   BMI 31.05 kg/m  Body mass index is 31.05 kg/m. Physical Exam Vitals and nursing note reviewed.  Constitutional:      Appearance: Normal appearance. She is well-developed.  HENT:     Head: Normocephalic and atraumatic.     Right Ear: Tympanic membrane and external ear normal.     Left Ear: Tympanic membrane and external ear normal.     Nose: Nose normal.     Mouth/Throat:     Mouth: Mucous membranes are moist.     Pharynx: Oropharynx is clear.  Eyes:     Conjunctiva/sclera: Conjunctivae normal.  Cardiovascular:     Rate and Rhythm: Normal rate and regular rhythm.     Heart sounds: Normal heart sounds. No murmur heard. No friction rub. No gallop.   Pulmonary:     Effort: Pulmonary effort is normal.     Breath sounds: Normal breath sounds. No wheezing, rhonchi or rales.  Musculoskeletal:     Cervical back: Neck supple.  Skin:    General: Skin is warm.     Findings: No rash.  Neurological:     Mental Status: She is alert and oriented to person, place, and time.  Psychiatric:        Behavior: Behavior normal.    The plan was reviewed with the patient/family, and all questions/concerned were  addressed.  It was my pleasure to see Maedean today and participate in her care. Please feel free to contact me with any questions or concerns.  Sincerely,  Rexene Alberts, DO Allergy & Immunology  [facility] office: [place] office: [phone]

## 2021-01-04 NOTE — Assessment & Plan Note (Signed)
   Continue to avoid latex, codeine, sulfa.   Get bloodwork for latex IgE.

## 2021-01-08 ENCOUNTER — Encounter: Payer: Self-pay | Admitting: Allergy

## 2021-01-08 NOTE — Progress Notes (Signed)
Reviewed records-see scanned documents. 2021 skin testing was positive to Johnson grass, mold, dust mites, cat.  Select foods were negative.

## 2021-01-10 LAB — LATEX, IGE: Latex IgE: <0.1 kU/L

## 2021-01-10 LAB — ALLERGEN, ANNATTO SEED
Annatto Seed IgE*: <0.35 kU/L (ref <0.35)
Class Interpretation: 0

## 2021-01-10 LAB — TRYPTASE: Tryptase: 5.4 ug/L (ref 2.2–13.2)

## 2021-01-10 LAB — ALLERGEN, RED (CARMINE) DYE, RF340: F340-IgE Carmine Red Dye: <0.1 kU/L

## 2021-01-12 ENCOUNTER — Telehealth: Payer: Self-pay

## 2021-01-12 NOTE — Telephone Encounter (Signed)
Patient called back to go over lab results, Angela Nevin read back the results to her.   Patient requested someone call her back to explain tryptase to her.   I called the patient back to over tryptase. She was really concerned about the results coming back negative. She did not understand how a food challenge would help. She is kind of interested in green dye testing and will call back to make an appointment after thinking it over. Will send her the protocol information for allergy challenges per patient request.

## 2021-01-13 NOTE — Telephone Encounter (Signed)
Please call patient back.  Tryptase checks for mast cell issues which she does not have but sometimes I check this when people have "allergic" reactions.   As discussed at our last visit green dye is made of blue and yellow dye mixed together.   I usually recommend doing 1 dye at a time but if she is really only concerned about the green she can get green jello and prepare according to the instructions - this usually has yellow and blue dye together.  You must be off antihistamines for 3-5 days before. Must be in good health and not ill. No vaccines/injections within the past 7 days. Not on any antibiotics. Plan on being in the office for 2-3 hours and must bring in the food you want to do the oral challenge for. You must call to schedule an appointment and specify it's for a food challenge.

## 2021-01-13 NOTE — Telephone Encounter (Signed)
Called patient and she verbalized understanding of your note. She will call to make an appointment after reading over the protocols she is still not sure if she wants to do a challenge. She would like to do the green jello and understands that is to rule out both blue and yellow dye.

## 2021-01-19 ENCOUNTER — Telehealth (INDEPENDENT_AMBULATORY_CARE_PROVIDER_SITE_OTHER): Payer: BC Managed Care – PPO | Admitting: Family Medicine

## 2021-01-19 DIAGNOSIS — U071 COVID-19: Secondary | ICD-10-CM | POA: Diagnosis not present

## 2021-01-19 NOTE — Progress Notes (Signed)
Virtual Visit via Video Note  I connected with Samantha Delacruz  on 01/19/21 at  4:20 PM EDT by a video enabled telemedicine application and verified that I am speaking with the correct person using two identifiers.  Location patient: home,  Location provider:work or home office Persons participating in the virtual visit: patient, provider  I discussed the limitations of evaluation and management by telemedicine and the availability of in person appointments. The patient expressed understanding and agreed to proceed.   HPI:  Acute telemedicine visit for COVID19: -Onset: about 4-5 days ago; positive covid test at home 2 days ago -Symptoms include: cough, congestion, body aches, HA, nausea and vomiting initially -Denies: fever, CP, SOB, diarrhea, inability to eat/drink/get out of bed -Has tried: albuterol, tylenol -Pertinent past medical history:asthma, HTN, overweight/obesity -Pertinent medication allergies: Allergies  Allergen Reactions  . Latex Rash  . Green Dyes Hives    Green, yellow, blue  . Influenza Virus Vaccine Nausea And Vomiting  . Codeine Hives  . Lactose Intolerance (Gi) Diarrhea    cramping  . Mobic [Meloxicam]   . Sulfa Antibiotics Hives  -COVID-19 vaccine status: vaccinated and boosted  ROS: See pertinent positives and negatives per HPI.  Past Medical History:  Diagnosis Date  . Adult ADHD   . Anemia    history of  . Angio-edema   . Asthma   . Brain injury ([place])   . Headache    Migraines  . Heart palpitations   . Hemorrhoids, internal, with bleeding and mucous discharge - Grade 1 02/05/2014  . Herpes   . Hypertension   . Migraines   . Pneumonia   . Restless leg   . Rosacea   . Shingles   . Urticaria   . Vertigo     Past Surgical History:  Procedure Laterality Date  . CESAREAN SECTION  08/15/92   son  . COLONOSCOPY    . DILATATION & CURETTAGE/HYSTEROSCOPY WITH MYOSURE N/A 04/19/2016   Procedure: DILATATION & CURETTAGE/HYSTEROSCOPY WITH MYOSURE;   Surgeon: Anastasio Auerbach, MD;  Location: [facility];  Service: Gynecology;  Laterality: N/A;  request 7:30am start time   Requests one hour OR time.   . retinal peel left eye surgery    . thrush       Current Outpatient Medications:  .  albuterol (PROAIR HFA) 108 (90 Base) MCG/ACT inhaler, INHALE 2 PUFFS EVERY 4 HOURS AS NEEDED FOR WHEEZING OR SHORTNESS OF BREATH (Patient taking differently: Inhale 2 puffs into the lungs every 4 (four) hours as needed for wheezing or shortness of breath.), Disp: 8.5 Inhaler, Rfl: 2 .  amLODipine (NORVASC) 5 MG tablet, Take 1 tablet (5 mg total) by mouth daily., Disp: 90 tablet, Rfl: 1 .  Chlorpheniramine Maleate (ALLERGY PO), Take by mouth., Disp: , Rfl:  .  EPINEPHrine (AUVI-Q) 0.3 mg/0.3 mL IJ SOAJ injection, Inject 0.3 mg into the muscle as needed for anaphylaxis., Disp: 1 each, Rfl: 1 .  fluticasone (FLONASE) 50 MCG/ACT nasal spray, USE 1 SPRAY IN EACH NOSTRIL DAILY, Disp: 16 g, Rfl: 0 .  fluticasone-salmeterol (ADVAIR HFA) 230-21 MCG/ACT inhaler, TAKE 2 PUFFS BY MOUTH TWICE A DAY, Disp: 24 Inhaler, Rfl: 0 .  Ginger, Zingiber officinalis, (GINGER PO), Take by mouth., Disp: , Rfl:  .  lidocaine (XYLOCAINE) 5 % ointment, Apply 1 application topically 3 (three) times daily., Disp: 50 g, Rfl: 0 .  TURMERIC PO, Take by mouth., Disp: , Rfl:  .  valACYclovir (VALTREX) 500 MG tablet, Take 1 tablet (500  mg total) by mouth daily. (Patient taking differently: Take 500 mg by mouth. 2 tablets day), Disp: 30 tablet, Rfl: 5  Current Facility-Administered Medications:  .  0.9 %  sodium chloride infusion, 500 mL, Intravenous, Once, Gatha Mayer, MD  EXAM:  VITALS per patient if applicable:  GENERAL: alert, oriented, appears well and in no acute distress  HEENT: atraumatic, conjunttiva clear, no obvious abnormalities on inspection of external nose and ears  NECK: normal movements of the head and neck  LUNGS: on inspection no signs of respiratory distress,  breathing rate appears normal, no obvious gross SOB, gasping or wheezing  CV: no obvious cyanosis  MS: moves all visible extremities without noticeable abnormality  PSYCH/NEURO: pleasant and cooperative, no obvious depression or anxiety, speech and thought processing grossly intact  ASSESSMENT AND PLAN:  Discussed the following assessment and plan:  COVID-19   Discussed treatment options, ideal treatment window, potential complications, isolation and precautions for COVID-19.  After lengthy discussion, the patient declined referral for Covid outpatient treatment/oral treatment with EUA drugs at this time. Symptomatic care measures summarized in patient instructions. Work/School slipped offered:  declined Advised to seek prompt in person care if worsening, new symptoms arise, or if is not improving with treatment. Discussed options for inperson care if PCP office not available. Did let this patient know that I only do telemedicine on Tuesdays and Thursdays for [place]. Advised to schedule follow up visit with PCP or [facility] if any further questions or concerns to avoid delays in care.   I discussed the assessment and treatment plan with the patient. The patient was provided an opportunity to ask questions and all were answered. The patient agreed with the plan and demonstrated an understanding of the instructions.     Lucretia Kern, DO

## 2021-01-19 NOTE — Patient Instructions (Addendum)
  HOME CARE TIPS:   -can use nasal saline a few times per day if you have nasal congestion; sometimes  a short course of Afrin nasal spray for 3 days can help with symptoms as well  -stay hydrated, drink plenty of fluids and eat small healthy meals - avoid dairy  -If the Covid test is positive, check out the [facility] website for more information on home care, transmission and treatment for COVID19  -follow up with your doctor in 2-3 days unless improving and feeling better  -stay home while sick, except to seek medical care. If you have COVID19, ideally it would be best to stay home for a full 10 days since the onset of symptoms PLUS one day of no fever and feeling better. Wear a good mask that fits snugly (such as N95 or KN95) if around others to reduce the risk of transmission.  It was nice to meet you today, and I really hope you are feeling better soon. I help [place] out with telemedicine visits on Tuesdays and Thursdays and am available for visits on those days. If you have any concerns or questions following this visit please schedule a follow up visit with your Primary Care doctor or seek care at a local urgent care clinic to avoid delays in care.    Seek in person care or schedule a follow up video visit promptly if your symptoms worsen, new concerns arise or you are not improving with treatment. Call 911 and/or seek emergency care if your symptoms are severe or life threatening.

## 2021-02-04 ENCOUNTER — Encounter: Payer: Self-pay | Admitting: Internal Medicine

## 2021-02-04 ENCOUNTER — Telehealth (INDEPENDENT_AMBULATORY_CARE_PROVIDER_SITE_OTHER): Payer: BC Managed Care – PPO | Admitting: Internal Medicine

## 2021-02-04 ENCOUNTER — Other Ambulatory Visit: Payer: Self-pay

## 2021-02-04 DIAGNOSIS — I10 Essential (primary) hypertension: Secondary | ICD-10-CM

## 2021-02-04 DIAGNOSIS — J069 Acute upper respiratory infection, unspecified: Secondary | ICD-10-CM

## 2021-02-04 DIAGNOSIS — A609 Anogenital herpesviral infection, unspecified: Secondary | ICD-10-CM

## 2021-02-04 MED ORDER — VALACYCLOVIR HCL 500 MG PO TABS: 500.0000 mg | ORAL_TABLET | Freq: Every day | ORAL | 5 refills | Status: DC

## 2021-02-04 MED ORDER — AMLODIPINE BESYLATE 5 MG PO TABS: 5.0000 mg | ORAL_TABLET | Freq: Every day | ORAL | 1 refills | Status: DC

## 2021-02-04 NOTE — Progress Notes (Signed)
Virtual Visit via Telephone Note  I connected with Samantha Delacruz on 02/04/21 at  8:30 AM EDT by telephone and verified that I am speaking with the correct person using two identifiers.   I discussed the limitations, risks, security and privacy concerns of performing an evaluation and management service by telephone and the availability of in person appointments. I also discussed with the patient that there may be a patient responsible charge related to this service. The patient expressed understanding and agreed to proceed.  Location patient: home Location provider: work office Participants present for the call: patient, provider Patient did not have a visit in the prior 7 days to address this/these issue(s).   History of Present Illness:  She has scheduled this visit to discuss some ongoing concerns On 6/5 she tested positive for COVID.  At that time she had a mild cough.  A few days ago she was scraping popcorn ceiling while remodeling a house.  She has had increased cough symptoms and the tightness in her throat.  She has not had any shortness of breath.  No difficulty swallowing.  She does state that her throat feels red although its not sore.  She denies fever.  Yesterday after heavy bout of coughing she had some red tinge sputum.  At one time.  She has also been dealing with a genital herpes outbreak and needs a refill of her chronic suppressive valacyclovir therapy.   Observations/Objective: Patient sounds cheerful and well on the phone. I do not appreciate any increased work of breathing. Speech and thought processing are grossly intact. Patient reported vitals: None reported   Current Outpatient Medications:    albuterol (PROAIR HFA) 108 (90 Base) MCG/ACT inhaler, INHALE 2 PUFFS EVERY 4 HOURS AS NEEDED FOR WHEEZING OR SHORTNESS OF BREATH (Patient taking differently: Inhale 2 puffs into the lungs every 4 (four) hours as needed for wheezing or shortness of breath.), Disp:  8.5 Inhaler, Rfl: 2   amLODipine (NORVASC) 5 MG tablet, Take 1 tablet (5 mg total) by mouth daily., Disp: 90 tablet, Rfl: 1   Chlorpheniramine Maleate (ALLERGY PO), Take by mouth., Disp: , Rfl:    EPINEPHrine (AUVI-Q) 0.3 mg/0.3 mL IJ SOAJ injection, Inject 0.3 mg into the muscle as needed for anaphylaxis., Disp: 1 each, Rfl: 1   fluticasone (FLONASE) 50 MCG/ACT nasal spray, USE 1 SPRAY IN EACH NOSTRIL DAILY, Disp: 16 g, Rfl: 0   fluticasone-salmeterol (ADVAIR HFA) 230-21 MCG/ACT inhaler, TAKE 2 PUFFS BY MOUTH TWICE A DAY, Disp: 24 Inhaler, Rfl: 0   Ginger, Zingiber officinalis, (GINGER PO), Take by mouth., Disp: , Rfl:    lidocaine (XYLOCAINE) 5 % ointment, Apply 1 application topically 3 (three) times daily., Disp: 50 g, Rfl: 0   TURMERIC PO, Take by mouth., Disp: , Rfl:    valACYclovir (VALTREX) 500 MG tablet, Take 1 tablet (500 mg total) by mouth daily. (Patient taking differently: Take 500 mg by mouth. 2 tablets day), Disp: 30 tablet, Rfl: 5  Current Facility-Administered Medications:    0.9 %  sodium chloride infusion, 500 mL, Intravenous, Once, Gatha Mayer, MD  Review of Systems:  Constitutional: Denies fever, chills, diaphoresis, appetite change and fatigue.  HEENT: Denies photophobia, eye pain, redness, hearing loss, ear pain, sneezing, mouth sores, trouble swallowing, neck pain, neck stiffness and tinnitus.   Respiratory: Denies SOB, DOE, cough, chest tightness,  and wheezing.   Cardiovascular: Denies chest pain, palpitations and leg swelling.  Gastrointestinal: Denies nausea, vomiting, abdominal pain, diarrhea,  constipation, blood in stool and abdominal distention.  Genitourinary: Denies dysuria, urgency, frequency, hematuria, flank pain and difficulty urinating.  Endocrine: Denies: hot or cold intolerance, sweats, changes in hair or nails, polyuria, polydipsia. Musculoskeletal: Denies myalgias, back pain, joint swelling, arthralgias and gait problem.  Skin: Denies pallor, rash  and wound.  Neurological: Denies dizziness, seizures, syncope, weakness, light-headedness, numbness and headaches.  Hematological: Denies adenopathy. Easy bruising, personal or family bleeding history  Psychiatric/Behavioral: Denies suicidal ideation, mood changes, confusion, nervousness, sleep disturbance and agitation   Assessment and Plan:  Viral upper respiratory tract infection -20 days out from a COVID infection doubt that this is a recurrence of COVID, more likely some other viral respiratory illness.  Have advised observation, saline nasal spray, over-the-counter symptomatic medications and follow-up with me in 10 to 14 days if no improvement.  Essential hypertension -She is requesting amlodipine refills.  HSV (herpes simplex virus) anogenital infection -Refill valacyclovir    I discussed the assessment and treatment plan with the patient. The patient was provided an opportunity to ask questions and all were answered. The patient agreed with the plan and demonstrated an understanding of the instructions.   The patient was advised to call back or seek an in-person evaluation if the symptoms worsen or if the condition fails to improve as anticipated.  I provided 14 minutes of non-face-to-face time during this encounter.   Lelon Frohlich, MD [facility]

## 2021-04-14 ENCOUNTER — Telehealth (INDEPENDENT_AMBULATORY_CARE_PROVIDER_SITE_OTHER): Payer: BC Managed Care – PPO | Admitting: Internal Medicine

## 2021-04-14 DIAGNOSIS — J309 Allergic rhinitis, unspecified: Secondary | ICD-10-CM

## 2021-04-14 DIAGNOSIS — H9201 Otalgia, right ear: Secondary | ICD-10-CM | POA: Diagnosis not present

## 2021-04-14 NOTE — Progress Notes (Signed)
Virtual Visit via Video Note  I connected with Samantha Delacruz on 04/14/21 at  4:00 PM EDT by a video enabled telemedicine application and verified that I am speaking with the correct person using two identifiers.  Location patient: home Location provider: work office Persons participating in the virtual visit: patient, provider  I discussed the limitations of evaluation and management by telemedicine and the availability of in person appointments. The patient expressed understanding and agreed to proceed.   HPI: She is concerned because she believes that she might have "bugs in my ears and sinuses".  She states that she thinks she might have bedbugs in her house.  She had a company come out and do a deep cleaning including steaming her mattress.  She states that at one point an ENT removed some microscopic bugs from her ear years ago.  She feels like she has bugs jumping inside her head.  She states she has pulled some bugs out of her nose.  Some people tell her this is all related to her anxiety but she believes they are real.   ROS: Constitutional: Denies fever, chills, diaphoresis, appetite change and fatigue.  HEENT: Denies photophobia, eye pain, redness, hearing loss, ear pain, congestion, sore throat, rhinorrhea, sneezing, mouth sores, trouble swallowing, neck pain, neck stiffness and tinnitus.   Respiratory: Denies SOB, DOE, cough, chest tightness,  and wheezing.   Cardiovascular: Denies chest pain, palpitations and leg swelling.  Gastrointestinal: Denies nausea, vomiting, abdominal pain, diarrhea, constipation, blood in stool and abdominal distention.  Genitourinary: Denies dysuria, urgency, frequency, hematuria, flank pain and difficulty urinating.  Endocrine: Denies: hot or cold intolerance, sweats, changes in hair or nails, polyuria, polydipsia. Musculoskeletal: Denies myalgias, back pain, joint swelling, arthralgias and gait problem.  Skin: Denies pallor, rash and  wound.  Neurological: Denies dizziness, seizures, syncope, weakness, light-headedness, numbness and headaches.  Hematological: Denies adenopathy. Easy bruising, personal or family bleeding history  Psychiatric/Behavioral: Denies suicidal ideation, mood changes, confusion, nervousness, sleep disturbance and agitation   Past Medical History:  Diagnosis Date   Adult ADHD    Anemia    history of   Angio-edema    Asthma    Brain injury ([place])    Headache    Migraines   Heart palpitations    Hemorrhoids, internal, with bleeding and mucous discharge - Grade 1 02/05/2014   Herpes    Hypertension    Migraines    Pneumonia    Restless leg    Rosacea    Shingles    Urticaria    Vertigo     Past Surgical History:  Procedure Laterality Date   CESAREAN SECTION  08/15/92   son   COLONOSCOPY     DILATATION & CURETTAGE/HYSTEROSCOPY WITH MYOSURE N/A 04/19/2016   Procedure: [place];  Surgeon: Anastasio Auerbach, MD;  Location: [facility];  Service: Gynecology;  Laterality: N/A;  request 7:30am start time   Requests one hour OR time.    retinal peel left eye surgery     thrush      Family History  Problem Relation Age of Onset   Depression Mother    Diabetes Mother    Emphysema Mother    Stroke Mother    Cancer Mother        uterine    Heart disease Father    Heart failure Father    Thyroid disease Sister    Thyroid disease Brother    ADD / ADHD Son  Stroke Maternal Grandmother    Cancer Paternal Grandfather        esophgus    Colon cancer Neg Hx     SOCIAL HX:   reports that she has never smoked. She has never used smokeless tobacco. She reports that she does not currently use alcohol. She reports that she does not use drugs.   Current Outpatient Medications:    albuterol (PROAIR HFA) 108 (90 Base) MCG/ACT inhaler, INHALE 2 PUFFS EVERY 4 HOURS AS NEEDED FOR WHEEZING OR SHORTNESS OF BREATH (Patient taking differently: Inhale 2 puffs into  the lungs every 4 (four) hours as needed for wheezing or shortness of breath.), Disp: 8.5 Inhaler, Rfl: 2   amLODipine (NORVASC) 5 MG tablet, Take 1 tablet (5 mg total) by mouth daily., Disp: 90 tablet, Rfl: 1   Chlorpheniramine Maleate (ALLERGY PO), Take by mouth., Disp: , Rfl:    EPINEPHrine (AUVI-Q) 0.3 mg/0.3 mL IJ SOAJ injection, Inject 0.3 mg into the muscle as needed for anaphylaxis., Disp: 1 each, Rfl: 1   fluticasone (FLONASE) 50 MCG/ACT nasal spray, USE 1 SPRAY IN EACH NOSTRIL DAILY, Disp: 16 g, Rfl: 0   fluticasone-salmeterol (ADVAIR HFA) 230-21 MCG/ACT inhaler, TAKE 2 PUFFS BY MOUTH TWICE A DAY, Disp: 24 Inhaler, Rfl: 0   Ginger, Zingiber officinalis, (GINGER PO), Take by mouth., Disp: , Rfl:    lidocaine (XYLOCAINE) 5 % ointment, Apply 1 application topically 3 (three) times daily., Disp: 50 g, Rfl: 0   TURMERIC PO, Take by mouth., Disp: , Rfl:    valACYclovir (VALTREX) 500 MG tablet, Take 1 tablet (500 mg total) by mouth daily., Disp: 30 tablet, Rfl: 5  Current Facility-Administered Medications:    0.9 %  sodium chloride infusion, 500 mL, Intravenous, Once, Carlean Purl Ofilia Neas, MD  EXAM:   VITALS per patient if applicable: None reported  GENERAL: alert, oriented, appears well and in no acute distress  HEENT: atraumatic, conjunttiva clear, no obvious abnormalities on inspection of external nose and ears  NECK: normal movements of the head and neck  LUNGS: on inspection no signs of respiratory distress, breathing rate appears normal, no obvious gross increased work of breathing, gasping or wheezing  CV: no obvious cyanosis  MS: moves all visible extremities without noticeable abnormality  PSYCH/NEURO: pleasant and cooperative, no obvious depression or anxiety, speech and thought processing grossly intact  ASSESSMENT AND PLAN:   Right ear pain  - Plan: Ambulatory referral to ENT -Not much I can do about this over a video visit. -I will refer her to ENT for further  evaluation.      I discussed the assessment and treatment plan with the patient. The patient was provided an opportunity to ask questions and all were answered. The patient agreed with the plan and demonstrated an understanding of the instructions.   The patient was advised to call back or seek an in-person evaluation if the symptoms worsen or if the condition fails to improve as anticipated.    Lelon Frohlich, MD  [facility] at [facility]

## 2021-05-18 ENCOUNTER — Other Ambulatory Visit: Payer: Self-pay

## 2021-05-18 ENCOUNTER — Ambulatory Visit (INDEPENDENT_AMBULATORY_CARE_PROVIDER_SITE_OTHER): Payer: BC Managed Care – PPO | Admitting: Otolaryngology

## 2021-05-18 DIAGNOSIS — J310 Chronic rhinitis: Secondary | ICD-10-CM | POA: Diagnosis not present

## 2021-05-18 NOTE — Progress Notes (Signed)
HPI: Samantha Delacruz is a 55 y.o. female who presents is referred by her PCP for evaluation of nasal sinus and ear complaints.  She thought she might have "bugs in her ears and sinuses".  She thought that she might of had bedbugs in her house.  She stated that I had performed a myringotomy in her ear several years ago and that I told her bugs were in the fluid.  She thought it was the right ear because she was having symptoms more on the right side presently. 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  There were no mention of infection or bugs at that time. She does have history of allergies and uses Flonase intermittently. .  Past Medical History:  Diagnosis Date   Adult ADHD    Anemia    history of   Angio-edema    Asthma    Brain injury ([place])    Headache    Migraines   Heart palpitations    Hemorrhoids, internal, with bleeding and mucous discharge - Grade 1 02/05/2014   Herpes    Hypertension    Migraines    Pneumonia    Restless leg    Rosacea    Shingles    Urticaria    Vertigo    Past Surgical History:  Procedure Laterality Date   CESAREAN SECTION  08/15/92   son   COLONOSCOPY     DILATATION & CURETTAGE/HYSTEROSCOPY WITH MYOSURE N/A 04/19/2016   Procedure: [place];  Surgeon: Anastasio Auerbach, MD;  Location: [facility];  Service: Gynecology;  Laterality: N/A;  request 7:30am start time   Requests one hour OR time.    retinal peel left eye surgery     thrush     Social History   Socioeconomic History   Marital status: Divorced    Spouse name: Not on file   Number of children: 1   Years of education: Not on file   Highest education level: Not on file  Occupational History    Employer: [employer]  Tobacco Use   Smoking status: Never   Smokeless tobacco: Never  Vaping Use   Vaping Use: Never used  Substance and Sexual  Activity   Alcohol use: Not Currently    Alcohol/week: 0.0 standard drinks   Drug use: No   Sexual activity: Yes    Partners: Male    Birth control/protection: Post-menopausal  Other Topics Concern   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   No caffeine         Social Determinants of Health   Financial Resource Strain: Not on file  Food Insecurity: Not on file  Transportation Needs: Not on file  Physical Activity: Not on file  Stress: Not on file  Social Connections: Not on file   Family History  Problem Relation Age of Onset   Depression Mother    Diabetes Mother    Emphysema Mother    Stroke Mother    Cancer Mother        uterine    Heart disease Father    Heart failure Father    Thyroid disease Sister    Thyroid disease Brother    ADD / ADHD Son  Stroke Maternal Grandmother    Cancer Paternal Grandfather        esophgus    Colon cancer Neg Hx    Allergies  Allergen Reactions   Latex Rash   Green Dyes Hives    Green, yellow, blue   Influenza Virus Vaccine Nausea And Vomiting   Codeine Hives   Lactose Intolerance (Gi) Diarrhea    cramping   Mobic [Meloxicam]    Sulfa Antibiotics Hives   Prior to Admission medications   Medication Sig Start Date End Date Taking? Authorizing Provider  albuterol (PROAIR HFA) 108 (90 Base) MCG/ACT inhaler INHALE 2 PUFFS EVERY 4 HOURS AS NEEDED FOR WHEEZING OR SHORTNESS OF BREATH Patient taking differently: Inhale 2 puffs into the lungs every 4 (four) hours as needed for wheezing or shortness of breath. 06/19/18   Dorena Cookey, MD  amLODipine (NORVASC) 5 MG tablet Take 1 tablet (5 mg total) by mouth daily. 02/04/21   Isaac Bliss, Rayford Halsted, MD  Chlorpheniramine Maleate (ALLERGY PO) Take by mouth.    [provider]  EPINEPHrine (AUVI-Q) 0.3 mg/0.3 mL IJ SOAJ injection Inject 0.3 mg  into the muscle as needed for anaphylaxis. 01/04/21   Garnet Sierras, DO  fluticasone [facility]) 50 MCG/ACT nasal spray USE 1 SPRAY IN [facility] NOSTRIL DAILY 10/25/17   Billie Ruddy, MD  fluticasone-salmeterol (ADVAIR HFA) [phone] MCG/ACT inhaler TAKE 2 PUFFS BY MOUTH TWICE A DAY 08/17/18   Marletta Lor, MD  Ginger, Zingiber officinalis, (GINGER PO) Take by mouth.    [provider]  lidocaine (XYLOCAINE) 5 % ointment Apply 1 application topically 3 (three) times daily. 11/17/20   Karma Ganja, NP  TURMERIC PO Take by mouth.    [provider]  valACYclovir (VALTREX) 500 MG tablet Take 1 tablet (500 mg total) by mouth daily. 02/04/21   Isaac Bliss, Rayford Halsted, MD     Positive ROS: Otherwise negative  All other systems have been reviewed and were otherwise negative with the exception of those mentioned in the HPI and as above.  Physical Exam: Constitutional: Alert, well-appearing, no acute distress Ears: External ears without lesions or tenderness. Ear canals are clear bilaterally with no inflammatory changes and no foreign bodies noted in either ear canal.  Both TMs were clear with good mobility on pneumatic otoscopy and no middle ear effusion noted. Nasal: External nose without lesions. Septum with minimal deformity.  On nasal endoscopy nasal passages were clear.  Both middle meatus regions were clear with no significant edema and no signs of infection..  The nasopharynx was clear. Oral: Lips and gums without lesions. Tongue and palate mucosa without lesions. Posterior oropharynx clear. Neck: No palpable adenopathy or masses Respiratory: Breathing comfortably  Skin: No facial/neck lesions or rash noted.  Procedures  Assessment: Mild rhinitis with no signs of infection. No "bugs" noted in the nasal cavity or ears on microscopic exam and endoscopy.   Plan: Reassured patient of no evidence of infection or "bugs" noted in the ears or the nose or sinuses. If she has much  congestion in the nose suggest regular use of Flonase 2 sprays each nostril at night and can use saline rinse as needed postnasal drainage. She will follow-up as needed I also reviewed with her concerning the myringotomy performed 6 years ago was on the left side for serous otitis.  No evidence of recurrence on clinical exam today.   Radene Journey, MD   CC:

## 2021-06-04 ENCOUNTER — Ambulatory Visit
Admission: RE | Admit: 2021-06-04 | Discharge: 2021-06-04 | Disposition: A | Discharge status: Home / Self Care | Payer: BC Managed Care – PPO | Source: Ambulatory Visit | Attending: Internal Medicine | Admitting: Internal Medicine

## 2021-06-04 ENCOUNTER — Other Ambulatory Visit: Payer: Self-pay

## 2021-06-04 ENCOUNTER — Other Ambulatory Visit: Payer: Self-pay | Admitting: Internal Medicine

## 2021-06-04 DIAGNOSIS — N6320 Unspecified lump in the left breast, unspecified quadrant: Secondary | ICD-10-CM

## 2021-06-10 ENCOUNTER — Telehealth: Payer: Self-pay | Admitting: Internal Medicine

## 2021-06-10 NOTE — Telephone Encounter (Signed)
Patient called to ask if she could get tested for Chagas disease, which is caused by Kissing bug bites. Patient states she has been bitten by these bugs and would like to find out if she is ok or not.I let patient know that they would need to look into which test they should do for this before scheduling her. Patient states there is two medications that can be used to treat this, the first one is benzidazole. The other is nifurtimox which is not to be used if you have brain injury, which patient does have.     Good callback number is [phone]     Please advise

## 2021-06-11 NOTE — Telephone Encounter (Signed)
Patient is aware 

## 2021-07-12 ENCOUNTER — Other Ambulatory Visit: Payer: Self-pay

## 2021-07-12 ENCOUNTER — Encounter: Payer: Self-pay | Admitting: Physician Assistant

## 2021-07-12 ENCOUNTER — Ambulatory Visit: Payer: BC Managed Care – PPO | Admitting: Physician Assistant

## 2021-07-12 VITALS — BP 132/80 | HR 91 | Ht 65.0 in | Wt 179.2 lb

## 2021-07-12 DIAGNOSIS — J029 Acute pharyngitis, unspecified: Secondary | ICD-10-CM | POA: Diagnosis not present

## 2021-07-12 DIAGNOSIS — R051 Acute cough: Secondary | ICD-10-CM

## 2021-07-12 LAB — POCT RAPID STREP A (OFFICE): Rapid Strep A Screen: NEGATIVE

## 2021-07-12 MED ORDER — AMOXICILLIN-POT CLAVULANATE 875-125 MG PO TABS: 1.0000 | ORAL_TABLET | Freq: Two times a day (BID) | ORAL | 0 refills | Status: DC

## 2021-07-12 NOTE — Patient Instructions (Signed)
It was great to see you!  Strep test negative  To treat for sinus infection, start oral augmentin antibiotic.  Push fluids and get plenty of rest. Please return if you are not improving as expected, or if you have high fevers (>101.5) or difficulty swallowing or worsening productive cough.  Call clinic with questions.  I hope you start feeling better soon!

## 2021-07-12 NOTE — Progress Notes (Signed)
Samantha Delacruz is a 55 y.o. female here for a cough and head congestion.  History of Present Illness:   Chief Complaint  Patient presents with   Cough    Pt c/o cough (productive) and chest congestion w/ headache and pressure nasal drainage. Wednesday started sore throat symptoms seem to get better but then worse again.    HPI  Cough  Samantha Delacruz presents with c/o sore throat that has been onset since last Wednesday. As the days went on she began developing a productive cough with green/yellow sputum and headache. At one point she believed sx to be improving but they recently worsened when she noticed she had chest congestion and excessive nasal drainage upon waking up this morning in addition to other sx. 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 She does have a hx of asthma and has not been using her albuterol inhaler or flonase nasal spray.  Denies fever or chills, wheezing, chest tightness.  She has hx of asthma, has not required use of any of her inhalers however.   Past Medical History:  Diagnosis Date   Adult ADHD    Anemia    history of   Angio-edema    Asthma    Brain injury    Headache    Migraines   Heart palpitations    Hemorrhoids, internal, with bleeding and mucous discharge - Grade 1 02/05/2014   Herpes    Hypertension    Migraines    Pneumonia    Restless leg    Rosacea    Shingles    Urticaria    Vertigo      Social History   Tobacco Use   Smoking status: Never   Smokeless tobacco: Never  Vaping Use   Vaping Use: Never used  Substance Use Topics   Alcohol use: Not Currently    Alcohol/week: 0.0 standard drinks   Drug use: No    Past Surgical History:  Procedure Laterality Date   CESAREAN SECTION  08/15/92   son   COLONOSCOPY     DILATATION & CURETTAGE/HYSTEROSCOPY WITH MYOSURE N/A 04/19/2016   Procedure: [place];   Surgeon: Anastasio Auerbach, MD;  Location: [facility];  Service: Gynecology;  Laterality: N/A;  request 7:30am start time   Requests one hour OR time.    retinal peel left eye surgery     thrush      Family History  Problem Relation Age of Onset   Depression Mother    Diabetes Mother    Emphysema Mother    Stroke Mother    Cancer Mother        uterine    Heart disease Father    Heart failure Father    Thyroid disease Sister    Thyroid disease Brother    ADD / ADHD Son    Stroke Maternal Grandmother    Cancer Paternal Grandfather        esophgus    Colon cancer Neg Hx     Allergies  Allergen Reactions   Latex Rash   Green Dyes Hives    Green, yellow, blue   Influenza Virus Vaccine Nausea And Vomiting   Codeine Hives   Lactose Intolerance (Gi) Diarrhea    cramping   Mobic [Meloxicam]    Sulfa Antibiotics Hives    Current Medications:   Current Outpatient Medications:    albuterol (PROAIR  HFA) 108 (90 Base) MCG/ACT inhaler, INHALE 2 PUFFS EVERY 4 HOURS AS NEEDED FOR WHEEZING OR SHORTNESS OF BREATH (Patient taking differently: Inhale 2 puffs into the lungs every 4 (four) hours as needed for wheezing or shortness of breath.), Disp: 8.5 Inhaler, Rfl: 2   amLODipine (NORVASC) 5 MG tablet, Take 1 tablet (5 mg total) by mouth daily., Disp: 90 tablet, Rfl: 1   Chlorpheniramine Maleate (ALLERGY PO), Take by mouth., Disp: , Rfl:    EPINEPHrine (AUVI-Q) 0.3 mg/0.3 mL IJ SOAJ injection, Inject 0.3 mg into the muscle as needed for anaphylaxis., Disp: 1 each, Rfl: 1   fluticasone-salmeterol (ADVAIR HFA) 230-21 MCG/ACT inhaler, TAKE 2 PUFFS BY MOUTH TWICE A DAY, Disp: 24 Inhaler, Rfl: 0   Ginger, Zingiber officinalis, (GINGER PO), Take by mouth., Disp: , Rfl:    TURMERIC PO, Take by mouth., Disp: , Rfl:    valACYclovir (VALTREX) 500 MG tablet, Take 1 tablet (500 mg total) by mouth daily., Disp: 30 tablet, Rfl: 5   fluticasone (FLONASE) 50 MCG/ACT nasal spray, USE 1 SPRAY IN EACH NOSTRIL  DAILY (Patient not taking: Reported on 07/12/2021), Disp: 16 g, Rfl: 0   lidocaine (XYLOCAINE) 5 % ointment, Apply 1 application topically 3 (three) times daily. (Patient not taking: Reported on 07/12/2021), Disp: 50 g, Rfl: 0  Current Facility-Administered Medications:    0.9 %  sodium chloride infusion, 500 mL, Intravenous, Once, Gatha Mayer, MD   Review of Systems:   ROS Negative unless otherwise specified per HPI. Vitals:   Vitals:   07/12/21 1549  BP: 132/80  Pulse: 91  SpO2: 98%  Weight: 179 lb 3.2 oz (81.3 kg)  Height: 5\' 5"  (1.651 m)     Body mass index is 29.82 kg/m.  Physical Exam:   Physical Exam Vitals and nursing note reviewed.  Constitutional:      General: She is not in acute distress.    Appearance: She is well-developed. She is not ill-appearing or toxic-appearing.  HENT:     Head: Normocephalic and atraumatic.     Right Ear: Tympanic membrane, ear canal and external ear normal. Tympanic membrane is not erythematous, retracted or bulging.     Left Ear: Tympanic membrane, ear canal and external ear normal. Tympanic membrane is not erythematous, retracted or bulging.     Nose: Nose normal.     Right Sinus: No maxillary sinus tenderness or frontal sinus tenderness.     Left Sinus: No maxillary sinus tenderness or frontal sinus tenderness.     Mouth/Throat:     Lips: Pink.     Mouth: Mucous membranes are moist.     Pharynx: Uvula midline. Posterior oropharyngeal erythema present.     Tonsils: No tonsillar exudate. 2+ on the right. 2+ on the left.  Eyes:     General: Lids are normal.     Conjunctiva/sclera: Conjunctivae normal.  Neck:     Trachea: Trachea normal.  Cardiovascular:     Rate and Rhythm: Normal rate and regular rhythm.     Pulses: Normal pulses.     Heart sounds: Normal heart sounds, S1 normal and S2 normal.  Pulmonary:     Effort: Pulmonary effort is normal.     Breath sounds: Normal breath sounds. No decreased breath sounds,  wheezing, rhonchi or rales.  Lymphadenopathy:     Cervical: No cervical adenopathy.  Skin:    General: Skin is warm and dry.  Neurological:     Mental Status: She is alert.  GCS: GCS eye subscore is 4. GCS verbal subscore is 5. GCS motor subscore is 6.  Psychiatric:        Speech: Speech normal.        Behavior: Behavior normal. Behavior is cooperative.   Results for orders placed or performed in visit on 07/12/21  POCT rapid strep A  Result Value Ref Range   Rapid Strep A Screen Negative Negative    Assessment and Plan:   Sore throat - Plan: POCT rapid strep A No red flags on exam.  Strep negative. Will initiate augmentin for tonsillitis and sinusitis per orders. Discussed taking medications as prescribed. Reviewed return precautions including worsening fever, SOB, worsening cough or other concerns. Push fluids and rest. I recommend that patient follow-up if symptoms worsen or persist despite treatment x 7-10 days, sooner if needed.  I,Havlyn C Ratchford,acting as a Education administrator for [employer], PA.,have documented all relevant documentation on the behalf of Inda Coke, PA,as directed by  Inda Coke, PA while in the presence of Inda Coke, [place].  I, Inda Coke, [place], have reviewed all documentation for this visit. The documentation on 07/12/21 for the exam, diagnosis, procedures, and orders are all accurate and complete.   Inda Coke, PA-C

## 2021-09-17 ENCOUNTER — Telehealth (INDEPENDENT_AMBULATORY_CARE_PROVIDER_SITE_OTHER): Payer: BC Managed Care – PPO | Admitting: Family Medicine

## 2021-09-17 DIAGNOSIS — J0101 Acute recurrent maxillary sinusitis: Secondary | ICD-10-CM | POA: Diagnosis not present

## 2021-09-17 MED ORDER — AMOXICILLIN-POT CLAVULANATE 875-125 MG PO TABS: 1.0000 | ORAL_TABLET | Freq: Two times a day (BID) | ORAL | 0 refills | Status: DC

## 2021-09-17 NOTE — Progress Notes (Signed)
Patient ID: [name], female   DOB: February 14, 1966, 56 y.o.   MRN: [id number]   This visit type was conducted due to national recommendations for restrictions regarding the COVID-19 pandemic in an effort to limit this patient's exposure and mitigate transmission in our community.   Virtual Visit via Video Note  I connected with Samantha Delacruz on 09/17/21 at  2:15 PM EST by a video enabled telemedicine application and verified that I am speaking with the correct person using two identifiers.  Location patient: home Location provider:work or home office Persons participating in the virtual visit: patient, provider  I discussed the limitations of evaluation and management by telemedicine and the availability of in person appointments. The patient expressed understanding and agreed to proceed.   HPI: Patient relates onset of "cold "over a week ago.  Feels like symptoms are getting worse if anything.  Progressive facial pain left frontal and maxillary sinus.  She had some yellowish and occasionally bloody discharge.  Minimal cough.  Occasional headaches.  Allergy to sulfa.  Had similar infection back in November which was treated with Augmentin and improved.  Works as a Pharmacist, hospital.   ROS: See pertinent positives and negatives per HPI.  Past Medical History:  Diagnosis Date   Adult ADHD    Anemia    history of   Angio-edema    Asthma    Brain injury    Headache    Migraines   Heart palpitations    Hemorrhoids, internal, with bleeding and mucous discharge - Grade 1 02/05/2014   Herpes    Hypertension    Migraines    Pneumonia    Restless leg    Rosacea    Shingles    Urticaria    Vertigo     Past Surgical History:  Procedure Laterality Date   CESAREAN SECTION  08/15/92   son   COLONOSCOPY     DILATATION & CURETTAGE/HYSTEROSCOPY WITH MYOSURE N/A 04/19/2016   Procedure: [place];  Surgeon: Anastasio Auerbach, MD;  Location: [facility];   Service: Gynecology;  Laterality: N/A;  request 7:30am start time   Requests one hour OR time.    retinal peel left eye surgery     thrush      Family History  Problem Relation Age of Onset   Depression Mother    Diabetes Mother    Emphysema Mother    Stroke Mother    Cancer Mother        uterine    Heart disease Father    Heart failure Father    Thyroid disease Sister    Thyroid disease Brother    ADD / ADHD Son    Stroke Maternal Grandmother    Cancer Paternal Grandfather        esophgus    Colon cancer Neg Hx     SOCIAL HX: Non-smoker   Current Outpatient Medications:    albuterol (PROAIR HFA) 108 (90 Base) MCG/ACT inhaler, INHALE 2 PUFFS EVERY 4 HOURS AS NEEDED FOR WHEEZING OR SHORTNESS OF BREATH (Patient taking differently: Inhale 2 puffs into the lungs every 4 (four) hours as needed for wheezing or shortness of breath.), Disp: 8.5 Inhaler, Rfl: 2   amLODipine (NORVASC) 5 MG tablet, Take 1 tablet (5 mg total) by mouth daily., Disp: 90 tablet, Rfl: 1   Chlorpheniramine Maleate (ALLERGY PO), Take by mouth., Disp: , Rfl:    EPINEPHrine (AUVI-Q) 0.3 mg/0.3 mL IJ SOAJ injection, Inject 0.3 mg into the  muscle as needed for anaphylaxis., Disp: 1 each, Rfl: 1   fluticasone (FLONASE) 50 MCG/ACT nasal spray, USE 1 SPRAY IN EACH NOSTRIL DAILY, Disp: 16 g, Rfl: 0   fluticasone-salmeterol (ADVAIR HFA) 230-21 MCG/ACT inhaler, TAKE 2 PUFFS BY MOUTH TWICE A DAY, Disp: 24 Inhaler, Rfl: 0   Ginger, Zingiber officinalis, (GINGER PO), Take by mouth., Disp: , Rfl:    lidocaine (XYLOCAINE) 5 % ointment, Apply 1 application topically 3 (three) times daily., Disp: 50 g, Rfl: 0   TURMERIC PO, Take by mouth., Disp: , Rfl:    valACYclovir (VALTREX) 500 MG tablet, Take 1 tablet (500 mg total) by mouth daily., Disp: 30 tablet, Rfl: 5   amoxicillin-clavulanate (AUGMENTIN) 875-125 MG tablet, Take 1 tablet by mouth 2 (two) times daily., Disp: 20 tablet, Rfl: 0  Current Facility-Administered  Medications:    0.9 %  sodium chloride infusion, 500 mL, Intravenous, Once, Gatha Mayer, MD  EXAM:  VITALS per patient if applicable:  GENERAL: alert, oriented, appears well and in no acute distress  HEENT: atraumatic, conjunttiva clear, no obvious abnormalities on inspection of external nose and ears  NECK: normal movements of the head and neck  LUNGS: on inspection no signs of respiratory distress, breathing rate appears normal, no obvious gross SOB, gasping or wheezing  CV: no obvious cyanosis  MS: moves all visible extremities without noticeable abnormality  PSYCH/NEURO: pleasant and cooperative, no obvious depression or anxiety, speech and thought processing grossly intact  ASSESSMENT AND PLAN:  Discussed the following assessment and plan:   Acute sinusitis.  We explained that most sinusitis is viral but she has had progressive symptoms over 10 days.  Predominantly left-sided symptoms as above.  We will go ahead and start Augmentin 875 mg twice a day for 10 days.  Follow-up with primary for any persistent or worsening symptoms.    I discussed the assessment and treatment plan with the patient. The patient was provided an opportunity to ask questions and all were answered. The patient agreed with the plan and demonstrated an understanding of the instructions.   The patient was advised to call back or seek an in-person evaluation if the symptoms worsen or if the condition fails to improve as anticipated.     Carolann Littler, MD

## 2021-11-09 DIAGNOSIS — S069XAA Unspecified intracranial injury with loss of consciousness status unknown, initial encounter: Secondary | ICD-10-CM | POA: Insufficient documentation

## 2021-11-09 DIAGNOSIS — D649 Anemia, unspecified: Secondary | ICD-10-CM | POA: Insufficient documentation

## 2021-11-09 DIAGNOSIS — L719 Rosacea, unspecified: Secondary | ICD-10-CM | POA: Insufficient documentation

## 2021-11-09 DIAGNOSIS — G2581 Restless legs syndrome: Secondary | ICD-10-CM | POA: Insufficient documentation

## 2021-11-09 DIAGNOSIS — T783XXA Angioneurotic edema, initial encounter: Secondary | ICD-10-CM | POA: Insufficient documentation

## 2021-11-09 DIAGNOSIS — F909 Attention-deficit hyperactivity disorder, unspecified type: Secondary | ICD-10-CM | POA: Insufficient documentation

## 2021-11-09 DIAGNOSIS — L509 Urticaria, unspecified: Secondary | ICD-10-CM | POA: Insufficient documentation

## 2021-11-09 DIAGNOSIS — N6002 Solitary cyst of left breast: Secondary | ICD-10-CM | POA: Insufficient documentation

## 2021-11-09 DIAGNOSIS — G43909 Migraine, unspecified, not intractable, without status migrainosus: Secondary | ICD-10-CM | POA: Insufficient documentation

## 2021-11-09 DIAGNOSIS — K649 Unspecified hemorrhoids: Secondary | ICD-10-CM | POA: Insufficient documentation

## 2021-11-14 DIAGNOSIS — F419 Anxiety disorder, unspecified: Secondary | ICD-10-CM | POA: Insufficient documentation

## 2021-11-14 DIAGNOSIS — D259 Leiomyoma of uterus, unspecified: Secondary | ICD-10-CM | POA: Insufficient documentation

## 2021-12-03 ENCOUNTER — Other Ambulatory Visit: Payer: Self-pay | Admitting: Internal Medicine

## 2021-12-03 ENCOUNTER — Ambulatory Visit
Admission: RE | Admit: 2021-12-03 | Discharge: 2021-12-03 | Disposition: A | Discharge status: Home / Self Care | Payer: BC Managed Care – PPO | Source: Ambulatory Visit | Attending: Internal Medicine | Admitting: Internal Medicine

## 2021-12-03 DIAGNOSIS — N6320 Unspecified lump in the left breast, unspecified quadrant: Secondary | ICD-10-CM

## 2021-12-03 DIAGNOSIS — I10 Essential (primary) hypertension: Secondary | ICD-10-CM

## 2021-12-14 ENCOUNTER — Ambulatory Visit (INDEPENDENT_AMBULATORY_CARE_PROVIDER_SITE_OTHER): Payer: BC Managed Care – PPO | Admitting: Internal Medicine

## 2021-12-14 ENCOUNTER — Encounter: Payer: Self-pay | Admitting: Internal Medicine

## 2021-12-14 VITALS — BP 120/84 | HR 99 | Temp 98.1°F | Ht 66.0 in | Wt 176.8 lb

## 2021-12-14 DIAGNOSIS — Z0000 Encounter for general adult medical examination without abnormal findings: Secondary | ICD-10-CM

## 2021-12-14 DIAGNOSIS — I10 Essential (primary) hypertension: Secondary | ICD-10-CM | POA: Diagnosis not present

## 2021-12-14 DIAGNOSIS — Z23 Encounter for immunization: Secondary | ICD-10-CM | POA: Diagnosis not present

## 2021-12-14 NOTE — Patient Instructions (Signed)
-  Nice seeing you today!! ? ?-Lab work today; will notify you once results are available. ? ?-Tdap vaccine today. ? ?-Remember your bivalent COVID vaccine at the pharmacy. ? ?-Schedule follow up in 6 months or sooner as needed. ? ? ?

## 2021-12-14 NOTE — Progress Notes (Signed)
? ? ? ?Established Patient Office Visit ? ? ? ? ?CC/Reason for Visit: Annual preventive exam ? ?HPI: Samantha Delacruz is a 56 y.o. female who is coming in today for the above mentioned reasons. Past Medical History is significant for: Hypertension.  She is feeling well.  She is recovering from what sounds like possibly food poisoning or a stomach virus.  She is otherwise feeling well.  She has routine eye and dental care.  She is overdue for a Tdap and a COVID booster.  All cancer screening is up-to-date. ? ? ?Past Medical/Surgical History: ?Past Medical History:  ?Diagnosis Date  ? Adult ADHD   ? Anemia   ? history of  ? Angio-edema   ? Asthma   ? 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 Headache   ? Migraines  ? Heart palpitations   ? Hemorrhoids, internal, with bleeding and mucous discharge - Grade 1 02/05/2014  ? Herpes   ? Hypertension   ? Migraines   ? Pneumonia   ? Restless leg   ? Rosacea   ? Shingles   ? Urticaria   ? Vertigo   ? ? ?Past Surgical History:  ?Procedure Laterality Date  ? CESAREAN SECTION  08/15/92  ? son  ? COLONOSCOPY    ? DILATATION & CURETTAGE/HYSTEROSCOPY WITH MYOSURE N/A 04/19/2016  ? Procedure: [place];  Surgeon: Anastasio Auerbach, MD;  Location: [facility];  Service: Gynecology;  Laterality: N/A;  request 7:30am start time  ? ?Requests one hour OR time.   ? retinal peel left eye surgery    ? thrush    ? ? ?Social History: ? reports that she has never smoked. She has never used smokeless tobacco. She reports that she does not currently use alcohol. She reports that she does not use drugs. ? ?Allergies: ?Allergies  ?Allergen Reactions  ? Latex Rash  ? Green Dyes Hives  ?  Green, yellow, blue  ? Influenza Virus Vaccine Nausea And Vomiting  ? Codeine Hives  ? Lactose Intolerance (Gi) Diarrhea  ?  cramping  ? Mobic [Meloxicam]   ? Sulfa Antibiotics Hives  ? ? ?Family History:  ?Family History  ?Problem Relation Age of Onset  ? Depression Mother   ? Diabetes Mother   ?  Emphysema Mother   ? Stroke Mother   ? Cancer Mother   ?     uterine   ? Heart disease Father   ? Heart failure Father   ? Thyroid disease Sister   ? Thyroid disease Brother   ? ADD / ADHD Son   ? Stroke Maternal Grandmother   ? Cancer Paternal Grandfather   ?     esophgus   ? Colon cancer Neg Hx   ? ? ? ?Current Outpatient Medications:  ?  albuterol (PROAIR HFA) 108 (90 Base) MCG/ACT inhaler, INHALE 2 PUFFS EVERY 4 HOURS AS NEEDED FOR WHEEZING OR SHORTNESS OF BREATH (Patient taking differently: Inhale 2 puffs into the lungs every 4 (four) hours as needed for wheezing or shortness of breath.), Disp: 8.5 Inhaler, Rfl: 2 ?  amLODipine (NORVASC) 5 MG tablet, Take 1 tablet (5 mg total) by mouth daily., Disp: 30 tablet, Rfl: 0 ?  Chlorpheniramine Maleate (ALLERGY PO), Take by mouth., Disp: , Rfl:  ?  EPINEPHrine (AUVI-Q) 0.3 mg/0.3 mL IJ SOAJ injection, Inject 0.3 mg into the muscle as needed for anaphylaxis., Disp: 1 each, Rfl: 1 ?  fluticasone (FLONASE) 50 MCG/ACT nasal spray, USE 1  SPRAY IN EACH NOSTRIL DAILY, Disp: 16 g, Rfl: 0 ?  fluticasone-salmeterol (ADVAIR HFA) 230-21 MCG/ACT inhaler, TAKE 2 PUFFS BY MOUTH TWICE A DAY, Disp: 24 Inhaler, Rfl: 0 ?  Ginger, Zingiber officinalis, (GINGER PO), Take by mouth., Disp: , Rfl:  ?  lidocaine (XYLOCAINE) 5 % ointment, Apply 1 application topically 3 (three) times daily., Disp: 50 g, Rfl: 0 ?  TURMERIC PO, Take by mouth., Disp: , Rfl:  ?  valACYclovir (VALTREX) 500 MG tablet, Take 1 tablet (500 mg total) by mouth daily., Disp: 30 tablet, Rfl: 5 ? ?Current Facility-Administered Medications:  ?  0.9 %  sodium chloride infusion, 500 mL, Intravenous, Once, Gatha Mayer, MD ? ?Review of Systems:  ?Constitutional: Denies fever, chills, diaphoresis, appetite change and fatigue.  ?HEENT: Denies photophobia, eye pain, redness, hearing loss, ear pain, congestion, sore throat, rhinorrhea, sneezing, mouth sores, trouble swallowing, neck pain, neck stiffness and tinnitus.    ?Respiratory: Denies SOB, DOE, cough, chest tightness,  and wheezing.   ?Cardiovascular: Denies chest pain, palpitations and leg swelling.  ?Gastrointestinal: Denies nausea, vomiting, abdominal pain, diarrhea, constipation, blood in stool and abdominal distention.  ?Genitourinary: Denies dysuria, urgency, frequency, hematuria, flank pain and difficulty urinating.  ?Endocrine: Denies: hot or cold intolerance, sweats, changes in hair or nails, polyuria, polydipsia. ?Musculoskeletal: Denies myalgias, back pain, joint swelling, arthralgias and gait problem.  ?Skin: Denies pallor, rash and wound.  ?Neurological: Denies dizziness, seizures, syncope, weakness, light-headedness, numbness and headaches.  ?Hematological: Denies adenopathy. Easy bruising, personal or family bleeding history  ?Psychiatric/Behavioral: Denies suicidal ideation, mood changes, confusion, nervousness, sleep disturbance and agitation ? ? ? ?Physical Exam: ?Vitals:  ? 12/14/21 1459  ?BP: 120/84  ?Pulse: 99  ?Temp: 98.1 ?F (36.7 ?C)  ?TempSrc: Oral  ?SpO2: 97%  ?Weight: 176 lb 12.8 oz (80.2 kg)  ?Height: '5\' 6"'$  (1.676 m)  ? ? ?Body mass index is 28.54 kg/m?. ? ? ?Constitutional: NAD, calm, comfortable ?Eyes: PERRL, lids and conjunctivae normal ?ENMT: Mucous membranes are moist. Posterior pharynx clear of any exudate or lesions. Normal dentition. Tympanic membrane is pearly white, no erythema or bulging. ?Neck: normal, supple, no masses, no thyromegaly ?Respiratory: clear to auscultation bilaterally, no wheezing, no crackles. Normal respiratory effort. No accessory muscle use.  ?Cardiovascular: Regular rate and rhythm, no murmurs / rubs / gallops. No extremity edema. 2+ pedal pulses. No carotid bruits.  ?Abdomen: no tenderness, no masses palpated. No hepatosplenomegaly. Bowel sounds positive.  ?Musculoskeletal: no clubbing / cyanosis. No joint deformity upper and lower extremities. Good ROM, no contractures. Normal muscle tone.  ?Skin: no rashes,  lesions, ulcers. No induration ?Neurologic: CN 2-12 grossly intact. Sensation intact, DTR normal. Strength 5/5 in all 4.  ?Psychiatric: Normal judgment and insight. Alert and oriented x 3. Normal mood.  ? ? ?Impression and Plan: ? ?Encounter for preventive health examination  ?-Recommend routine eye and dental care. ?-Immunizations: Tdap in office today, she will get a COVID booster at her pharmacy ?-Healthy lifestyle discussed in detail. ?-Labs to be updated today. ?-Colon cancer screening: 12/2020 ?-Breast cancer screening: 05/2021 ?-Cervical cancer screening: 09/2019 ?-Lung cancer screening: Not applicable ?-Prostate cancer screening: Not applicable ?-DEXA: Not applicable ? ?Need for Tdap vaccination ?-Tdap administered today. ? ?Primary hypertension  ?- Plan: CBC with Differential/Platelet, Comprehensive metabolic panel, Lipid panel ?-Well-controlled on amlodipine. ? ? ? ? ?Patient Instructions  ?-Nice seeing you today!! ? ?-Lab work today; will notify you once results are available. ? ?-Tdap vaccine today. ? ?-Remember your bivalent COVID vaccine  at the pharmacy. ? ?-Schedule follow up in 6 months or sooner as needed. ? ? ? ? ? ?Lelon Frohlich, MD ?[facility] ? ? ?

## 2021-12-15 LAB — CBC WITH DIFFERENTIAL/PLATELET
Basophils Absolute: 0.1 10*3/uL (ref 0.0–0.1)
Basophils Relative: 1.1 % (ref 0.0–3.0)
Eosinophils Absolute: 0.2 10*3/uL (ref 0.0–0.7)
Eosinophils Relative: 1.8 % (ref 0.0–5.0)
HCT: 38.7 % (ref 36.0–46.0)
Hemoglobin: 13.2 g/dL (ref 12.0–15.0)
Lymphocytes Relative: 29.9 % (ref 12.0–46.0)
Lymphs Abs: 2.6 10*3/uL (ref 0.7–4.0)
MCHC: 34.1 g/dL (ref 30.0–36.0)
MCV: 87.6 fl (ref 78.0–100.0)
Monocytes Absolute: 0.8 10*3/uL (ref 0.1–1.0)
Monocytes Relative: 8.9 % (ref 3.0–12.0)
Neutro Abs: 5 10*3/uL (ref 1.4–7.7)
Neutrophils Relative %: 58.3 % (ref 43.0–77.0)
Platelets: 370 10*3/uL (ref 150.0–400.0)
RBC: 4.42 Mil/uL (ref 3.87–5.11)
RDW: 12.5 % (ref 11.5–15.5)
WBC: 8.6 10*3/uL (ref 4.0–10.5)

## 2021-12-15 LAB — COMPREHENSIVE METABOLIC PANEL
ALT: 12 U/L (ref 0–35)
AST: 17 U/L (ref 0–37)
Albumin: 4.4 g/dL (ref 3.5–5.2)
Alkaline Phosphatase: 74 U/L (ref 39–117)
BUN: 16 mg/dL (ref 6–23)
CO2: 29 mEq/L (ref 19–32)
Calcium: 9.4 mg/dL (ref 8.4–10.5)
Chloride: 101 mEq/L (ref 96–112)
Creatinine, Ser: 0.76 mg/dL (ref 0.40–1.20)
GFR: 87.17 mL/min (ref >60.00)
Glucose, Bld: 80 mg/dL (ref 70–99)
Potassium: 4.2 mEq/L (ref 3.5–5.1)
Sodium: 138 mEq/L (ref 135–145)
Total Bilirubin: 0.5 mg/dL (ref 0.2–1.2)
Total Protein: 7.4 g/dL (ref 6.0–8.3)

## 2021-12-15 LAB — VITAMIN D 25 HYDROXY (VIT D DEFICIENCY, FRACTURES): VITD: 30.01 ng/mL (ref 30.00–100.00)

## 2021-12-15 LAB — LIPID PANEL
Cholesterol: 165 mg/dL (ref 0–200)
HDL: 58.6 mg/dL (ref >39.00)
LDL Cholesterol: 96 mg/dL (ref 0–99)
NonHDL: 106.02
Total CHOL/HDL Ratio: 3
Triglycerides: 49 mg/dL (ref 0.0–149.0)
VLDL: 9.8 mg/dL (ref 0.0–40.0)

## 2021-12-15 LAB — TSH: TSH: 1.27 u[IU]/mL (ref 0.35–5.50)

## 2021-12-15 LAB — HEMOGLOBIN A1C: Hgb A1c MFr Bld: 5.7 % (ref 4.6–6.5)

## 2021-12-15 LAB — VITAMIN B12: Vitamin B-12: 309 pg/mL (ref 211–911)

## 2021-12-16 ENCOUNTER — Other Ambulatory Visit: Payer: Self-pay | Admitting: *Deleted

## 2021-12-16 DIAGNOSIS — I10 Essential (primary) hypertension: Secondary | ICD-10-CM

## 2021-12-16 MED ORDER — AMLODIPINE BESYLATE 5 MG PO TABS
5.0000 mg | ORAL_TABLET | Freq: Every day | ORAL | 1 refills | Status: DC
Start: 2021-12-16 — End: 2022-06-30

## 2022-02-01 LAB — HM PAP SMEAR: HM Pap smear: NORMAL

## 2022-02-01 LAB — RESULTS CONSOLE HPV: CHL HPV: NEGATIVE

## 2022-02-18 ENCOUNTER — Telehealth: Payer: Self-pay | Admitting: Internal Medicine

## 2022-02-18 DIAGNOSIS — M25551 Pain in right hip: Secondary | ICD-10-CM

## 2022-02-18 NOTE — Telephone Encounter (Signed)
Pt is requesting a referral to see the Ortho. Pain is on right side hip and back - says its been off and on for years. As per Pt, MD told her to call back for this referral if the pain worsened.   Would like to see a different Ortho this time. She does not want to see the one on [address], near the nursing home. She forgot the name and exact address, but did not like them.   Please advise.  [phone]

## 2022-02-21 NOTE — Telephone Encounter (Signed)
Okay to refer? 

## 2022-02-21 NOTE — Telephone Encounter (Signed)
Referral placed.

## 2022-02-23 ENCOUNTER — Other Ambulatory Visit: Payer: Self-pay | Admitting: Internal Medicine

## 2022-02-23 MED ORDER — SCOPOLAMINE 1 MG/3DAYS TD PT72: 1.0000 | MEDICATED_PATCH | TRANSDERMAL | 0 refills | Status: DC

## 2022-02-23 NOTE — Telephone Encounter (Signed)
Rx sent per Dr Elease Hashimoto.

## 2022-02-23 NOTE — Telephone Encounter (Signed)
Pt is calling and she will be travelling on plane this Friday 02-25-22 and would like to pick up this medication tomorrow 02-24-2022 and need this new rx trans-scope patch due to anxious and she gets vertigo and motion sickness when flying. Pt is aware md out of office and will return on 7-17-202 also would like to make sure this medication will not interfere with her other medications.   [place], [place] [address] Phone:  [phone]  Fax:  [phone]

## 2022-02-23 NOTE — Telephone Encounter (Signed)
Okay for Rx new rx trans-scope patch ?

## 2022-02-24 ENCOUNTER — Ambulatory Visit (INDEPENDENT_AMBULATORY_CARE_PROVIDER_SITE_OTHER): Payer: BC Managed Care – PPO | Admitting: Orthopaedic Surgery

## 2022-02-24 ENCOUNTER — Ambulatory Visit (INDEPENDENT_AMBULATORY_CARE_PROVIDER_SITE_OTHER): Payer: BC Managed Care – PPO

## 2022-02-24 DIAGNOSIS — M25551 Pain in right hip: Secondary | ICD-10-CM

## 2022-02-24 DIAGNOSIS — M47818 Spondylosis without myelopathy or radiculopathy, sacral and sacrococcygeal region: Secondary | ICD-10-CM | POA: Diagnosis not present

## 2022-02-24 DIAGNOSIS — M461 Sacroiliitis, not elsewhere classified: Secondary | ICD-10-CM

## 2022-02-24 NOTE — Progress Notes (Signed)
Chief Complaint: Right hip pain with radiation     History of Present Illness:    Samantha Delacruz is a 56 y.o. female presents with right hip pain which is radiating down the side.  This is been going on for several years now.  She states that she is having a very difficult time jogging or playing pickle ball with lateral movement.  She is attempting to be active with yoga and swimming although she continues to experience pain.  She states that the pain is located near the SI joint but does occasionally radiate down the front of the back.    Surgical History:   None  PMH/PSH/Family History/Social History/Meds/Allergies:    Past Medical History:  Diagnosis Date   Adult ADHD    Anemia    history of   Angio-edema    Asthma    Brain injury ([place])    Headache    Migraines   Heart palpitations    Hemorrhoids, internal, with bleeding and mucous discharge - Grade 1 02/05/2014   Herpes    Hypertension    Migraines    Pneumonia    Restless leg    Rosacea    Shingles    Urticaria    Vertigo    Past Surgical History:  Procedure Laterality Date   CESAREAN SECTION  08/15/92   son   COLONOSCOPY     DILATATION & CURETTAGE/HYSTEROSCOPY WITH MYOSURE N/A 04/19/2016   Procedure: [place];  Surgeon: Anastasio Auerbach, MD;  Location: [facility];  Service: Gynecology;  Laterality: N/A;  request 7:30am start time   Requests one hour OR time.    retinal peel left eye surgery     thrush     Social History   Socioeconomic History   Marital status: Divorced    Spouse name: Not on file   Number of children: 1   Years of education: Not on file   Highest education level: Not on file  Occupational History    Employer: [employer]  Tobacco Use   Smoking status: Never   Smokeless tobacco: Never  Vaping Use   Vaping Use: Never used  Substance and Sexual Activity   Alcohol use: Not Currently     Alcohol/week: 0.0 standard drinks of alcohol   Drug use: No   Sexual activity: Yes    Partners: Male    Birth control/protection: Post-menopausal  Other Topics Concern   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   No caffeine         Social Determinants of Health   Financial Resource Strain: Not on file  Food Insecurity: Not on file  Transportation Needs: Not on file  Physical Activity: Not on file  Stress: Not on file  Social Connections: Not on file   Family History  Problem Relation Age of Onset   Depression Mother    Diabetes Mother    Emphysema Mother    Stroke Mother    Cancer Mother        uterine    Heart disease Father    Heart failure Father    Thyroid disease Sister  Thyroid disease Brother    ADD / ADHD Son    Stroke Maternal Grandmother    Cancer Paternal Grandfather        esophgus    Colon cancer Neg Hx    Allergies  Allergen Reactions   Latex Rash   Green Dyes Hives    Green, yellow, blue   Influenza Virus Vaccine Nausea And Vomiting   Codeine Hives   Lactose Intolerance (Gi) Diarrhea    cramping   Mobic [Meloxicam]    Sulfa Antibiotics Hives   Current Outpatient Medications  Medication Sig Dispense Refill   albuterol (PROAIR HFA) 108 (90 Base) MCG/ACT inhaler INHALE 2 PUFFS EVERY 4 HOURS AS NEEDED FOR WHEEZING OR SHORTNESS OF BREATH (Patient taking differently: Inhale 2 puffs into the lungs every 4 (four) hours as needed for wheezing or shortness of breath.) 8.5 Inhaler 2   amLODipine (NORVASC) 5 MG tablet Take 1 tablet (5 mg total) by mouth daily. 90 tablet 1   Chlorpheniramine Maleate (ALLERGY PO) Take by mouth.     EPINEPHrine (AUVI-Q) 0.3 mg/0.3 mL IJ SOAJ injection Inject 0.3 mg into the muscle as needed for anaphylaxis. 1 each 1   fluticasone (FLONASE) 50 MCG/ACT nasal spray USE 1 SPRAY IN EACH NOSTRIL DAILY  16 g 0   fluticasone-salmeterol (ADVAIR HFA) 230-21 MCG/ACT inhaler TAKE 2 PUFFS BY MOUTH TWICE A DAY 24 Inhaler 0   Ginger, Zingiber officinalis, (GINGER PO) Take by mouth.     lidocaine (XYLOCAINE) 5 % ointment Apply 1 application topically 3 (three) times daily. 50 g 0   scopolamine (TRANSDERM-SCOP) 1 MG/3DAYS Place 1 patch (1.5 mg total) onto the skin every 3 (three) days. 10 patch 0   TURMERIC PO Take by mouth.     valACYclovir (VALTREX) 500 MG tablet Take 1 tablet (500 mg total) by mouth daily. 30 tablet 5   Current Facility-Administered Medications  Medication Dose Route Frequency Provider Last Rate Last Admin   0.9 %  sodium chloride infusion  500 mL Intravenous Once Gatha Mayer, MD       No results found.  Review of Systems:   A ROS was performed including pertinent positives and negatives as documented in the HPI.  Physical Exam :   Constitutional: NAD and appears stated age Neurological: Alert and oriented Psych: Appropriate affect and cooperative Last menstrual period 05/15/2017.   Comprehensive Musculoskeletal Exam:    Tenderness palpation about right SI joint with lateral compression as well as rotation force.  There is pain about the lateral aspect of the hip which radiates over the anterior aspect of the thigh as well.  Negative straight leg raise.  Full strength and sensation right lower extremity  Imaging:   Xray (AP pelvis, right hip 3 views): There is some SI joint arthritis but otherwise well-appearing femoral acetabular joint    I personally reviewed and interpreted the radiographs.   Assessment:   56 y.o. 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  To this effect I do believe that she would benefit from physical therapy for a good core strengthening and hip strengthening.  Would also like to plan to refer her to my partner Dr. Ernestina Patches for an x-ray guided SI injection.  Plan :     -Plan for referral to Dr. Ernestina Patches for right SI x-ray guided injection -Plan for physical therapy for core and hip strengthening     I personally saw and evaluated  the patient, and participated in the management and treatment plan.  Vanetta Mulders, MD Attending Physician, Orthopedic Surgery  This document was dictated using Dragon voice recognition software. A reasonable attempt at proof reading has been made to minimize errors.

## 2022-03-07 ENCOUNTER — Telehealth: Payer: Self-pay | Admitting: Internal Medicine

## 2022-03-07 DIAGNOSIS — M25551 Pain in right hip: Secondary | ICD-10-CM

## 2022-03-07 NOTE — Telephone Encounter (Signed)
Pt is calling about her referral to ortho.  She is requesting a call back.

## 2022-03-07 NOTE — Telephone Encounter (Signed)
Spoke with patient and referral placed

## 2022-03-24 IMAGING — MG DIGITAL DIAGNOSTIC BILAT W/ TOMO W/ CAD
8 series · 8 of 24 positions shown · non-contrast
Comparison: Previous exams including diagnostic mammogram and
ultrasound dated 05/20/2020

CLINICAL DATA: Six-month follow-up for probably benign mass within
the LEFT breast. This mass was described on screening mammogram
dated 05/08/2020.

EXAM:
DIGITAL DIAGNOSTIC BILATERAL MAMMOGRAM WITH TOMOSYNTHESIS AND CAD;
ULTRASOUND LEFT BREAST LIMITED
TECHNIQUE: Bilateral digital diagnostic mammography and breast tomosynthesis
was performed. The images were evaluated with computer-aided
detection.; Targeted ultrasound examination of the left breast was
performed.

[L MLO synth-2D]
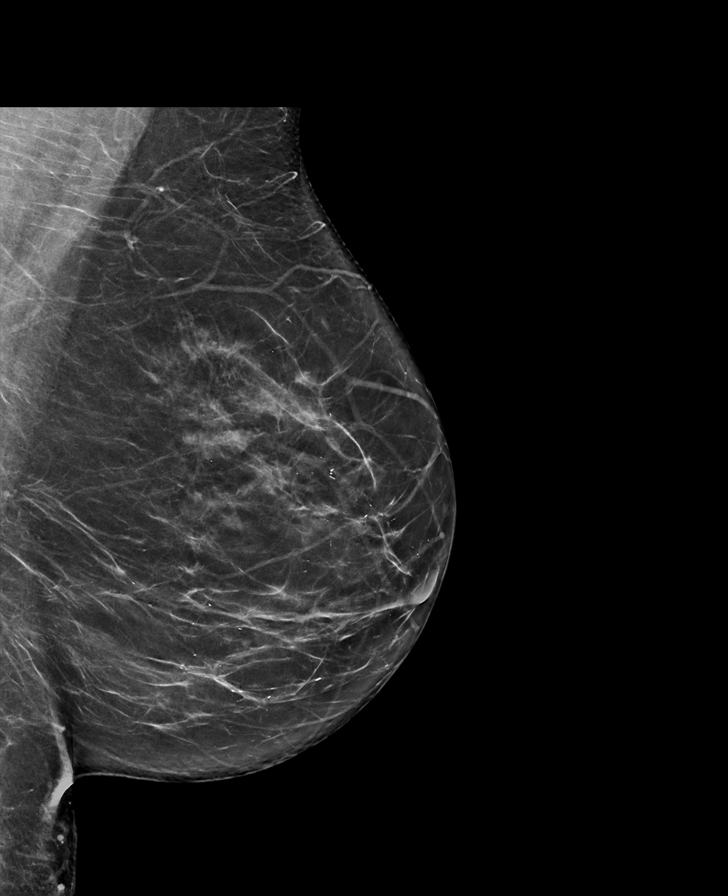

[R CC synth-2D]
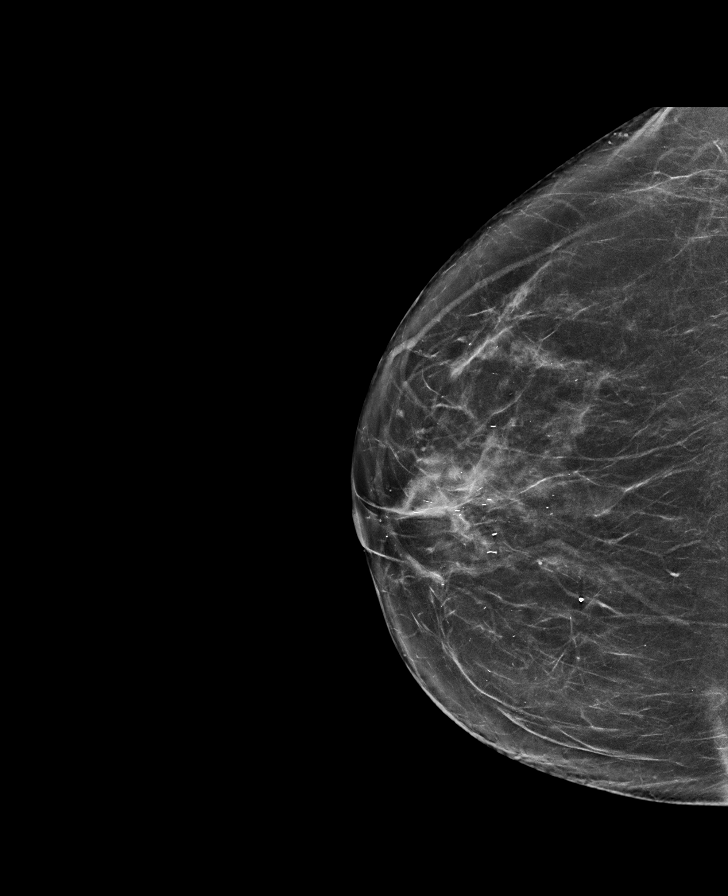

[R MLO synth-2D]
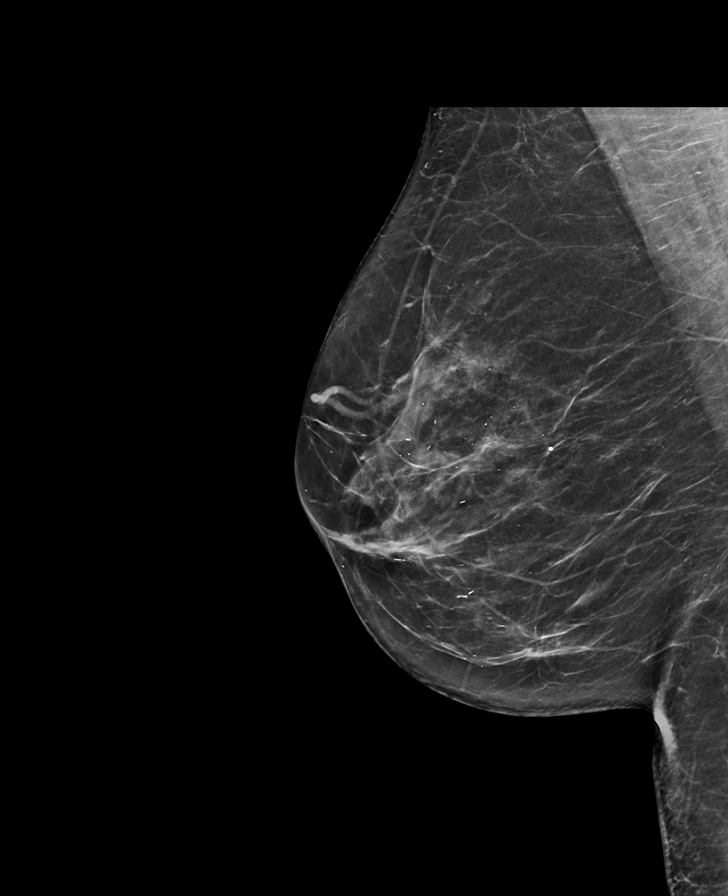

[L CC synth-2D]
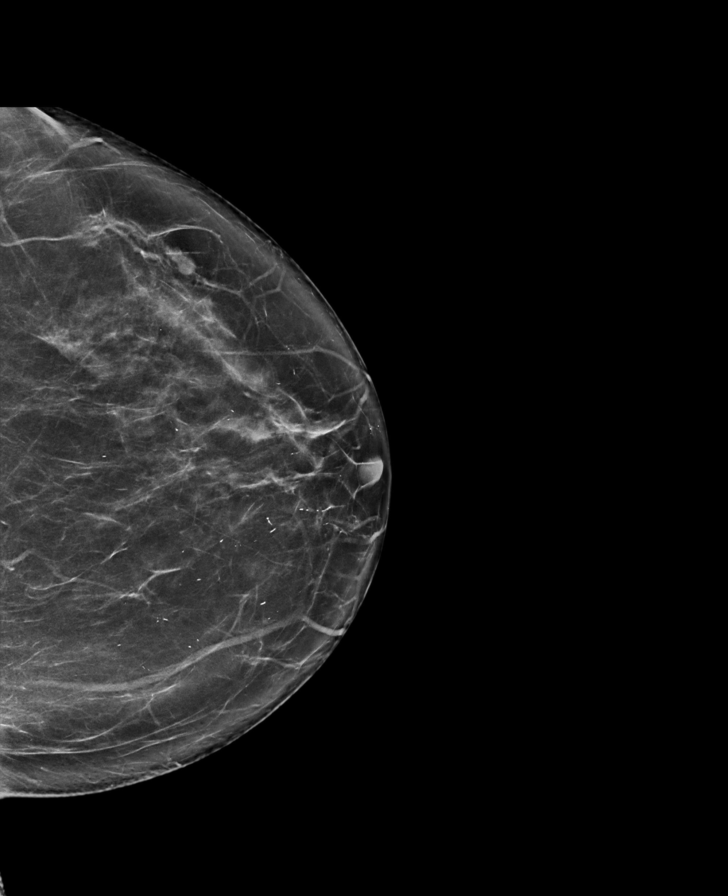

[L MLO tomo · tomo slice 41/81.0]
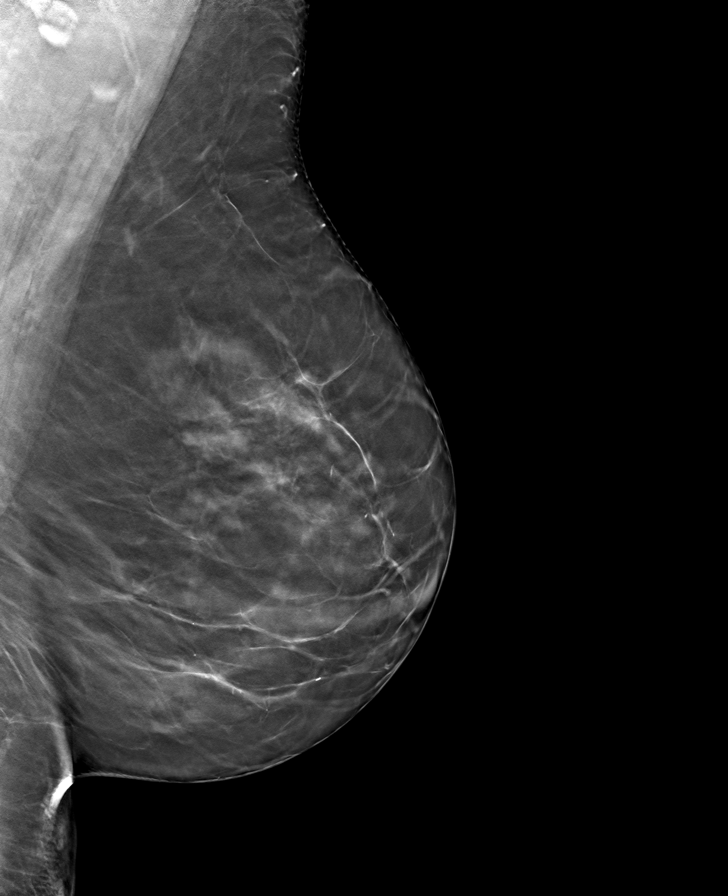

[L CC tomo · tomo slice 41/82.0]
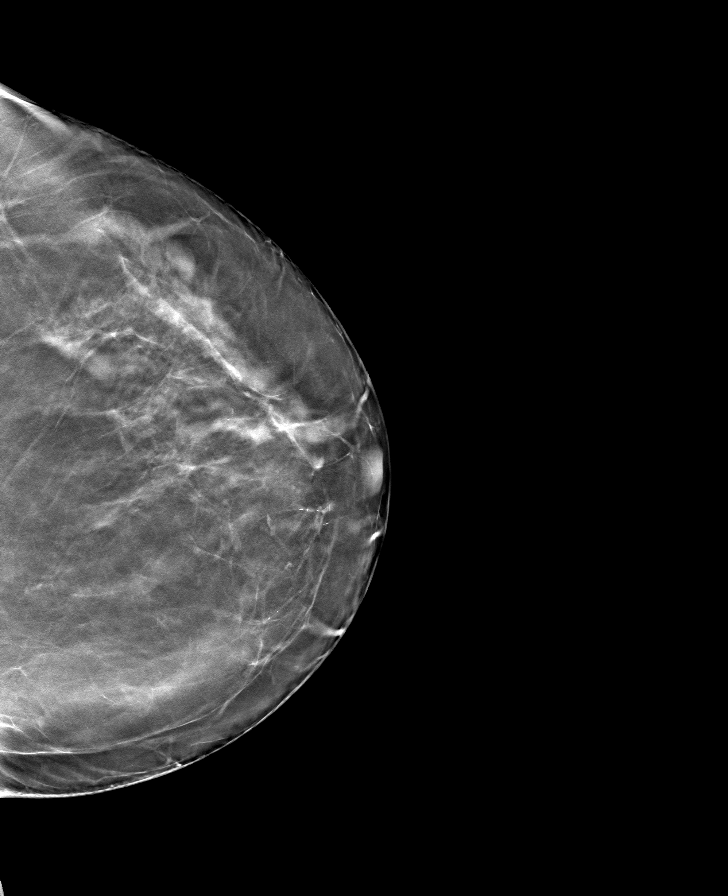

[R MLO tomo · tomo slice 39/77.0]
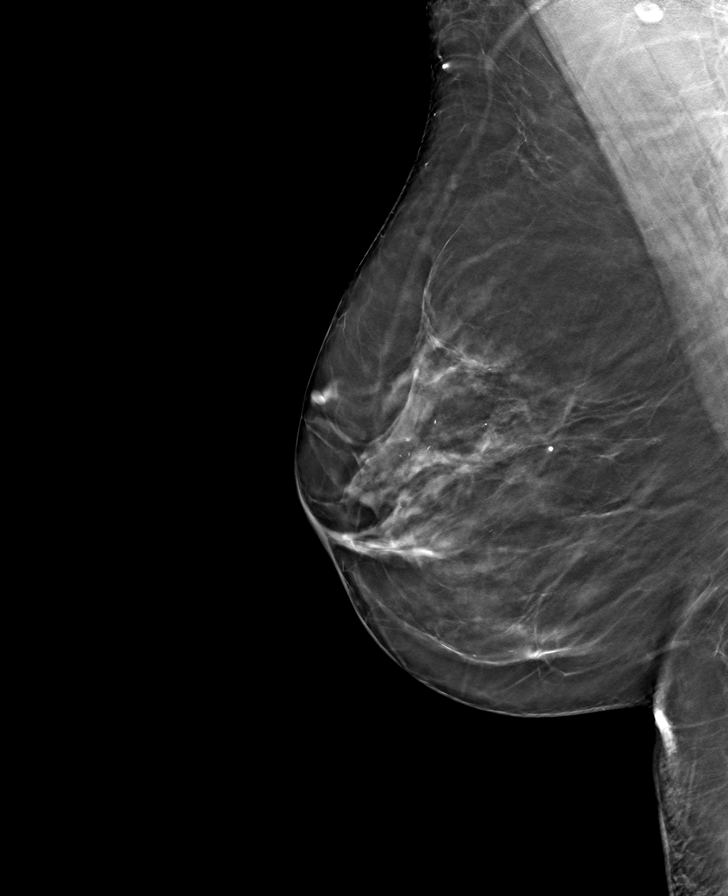

[R CC tomo · tomo slice 39/76.0]
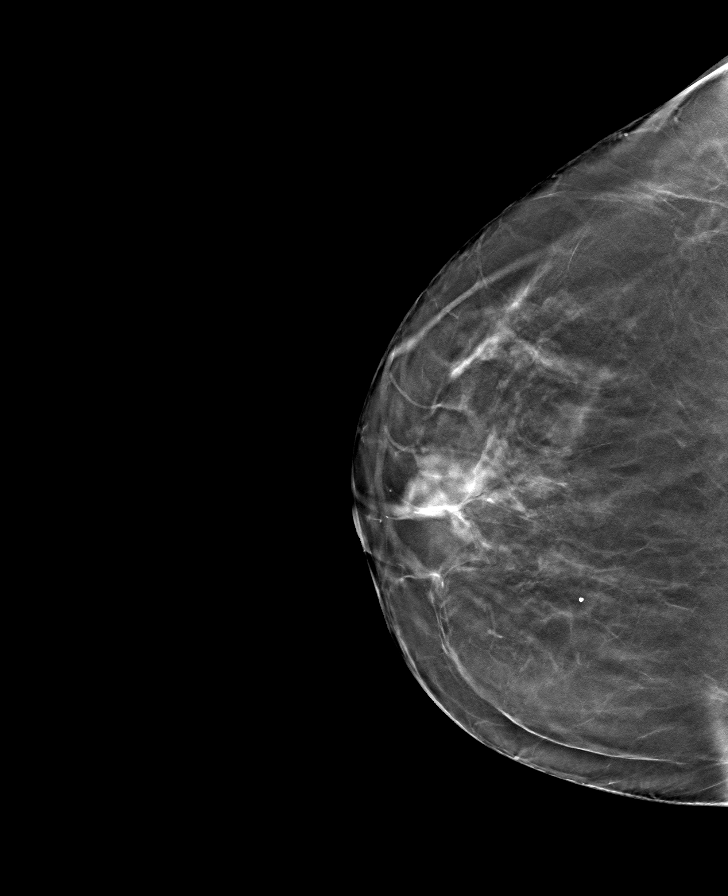

[8 of 24 positions shown; findings below may reference images not displayed]

ACR Breast Density Category b: There are scattered areas of
fibroglandular density.
FINDINGS: The partially obscured, partially circumscribed mass within the
outer LEFT breast is not significantly changed compared to the
previous mammograms of 05/08/2020 and 05/20/2020. There are no new
dominant masses, suspicious calcifications or secondary signs of
malignancy elsewhere within the LEFT breast.

There are no new dominant masses, suspicious calcifications or
secondary signs of malignancy within the RIGHT breast.

Targeted ultrasound is performed, again showing an oval hypoechoic
mass in the LEFT breast at the 2 o'clock axis, 5 cm from the nipple,
without internal vascularity, measuring 8 x 4 x 8 mm, not
significantly changed compared to the measurements on LEFT breast
ultrasound dated 12/25/2020, perhaps slightly larger compared to the
earlier ultrasound of 05/20/2020, again most suggestive of a
complicated cyst/cluster of cysts.

This finding may correspond to a benign mass described on previous
ultrasounds of 12/26/2013, 12/21/2012 and 06/05/2012.
IMPRESSION: 1. Probably benign complicated cyst, or cluster of cysts, within the
LEFT breast at the 2 o'clock axis, 5 cm from nipple, not
significantly changed compared to previous exams. This finding may
correspond to a benign mass described on remote ultrasound exams of
4496 through 0893. Recommend additional follow-up ultrasound in 6
months to ensure stability of the current appearance.
2. No evidence of malignancy within the RIGHT breast.

RECOMMENDATION:
LEFT breast ultrasound in 6 months.

I have discussed the findings and recommendations with the patient.
If applicable, a reminder letter will be sent to the patient
regarding the next appointment.

BI-RADS CATEGORY  3: Probably benign.

## 2022-03-25 ENCOUNTER — Telehealth: Payer: Self-pay | Admitting: Physical Medicine and Rehabilitation

## 2022-03-25 NOTE — Telephone Encounter (Signed)
Patient would like to reschedule her 03/30/22 appt.

## 2022-03-30 ENCOUNTER — Encounter: Payer: BC Managed Care – PPO | Admitting: Physical Medicine and Rehabilitation

## 2022-04-11 ENCOUNTER — Encounter: Payer: Self-pay | Admitting: Physical Medicine and Rehabilitation

## 2022-04-11 ENCOUNTER — Ambulatory Visit: Payer: Self-pay

## 2022-04-11 ENCOUNTER — Ambulatory Visit (INDEPENDENT_AMBULATORY_CARE_PROVIDER_SITE_OTHER): Payer: BC Managed Care – PPO | Admitting: Physical Medicine and Rehabilitation

## 2022-04-11 DIAGNOSIS — M461 Sacroiliitis, not elsewhere classified: Secondary | ICD-10-CM

## 2022-04-11 NOTE — Progress Notes (Signed)
   Samantha Delacruz - 56 y.o. female MRN [id number]  Date of birth: September 20, 1965  Office Visit Note: Visit Date: 04/11/2022 PCP: Isaac Bliss, Rayford Halsted, MD Referred by: Isaac Bliss, Estel*  Subjective: Chief Complaint  Patient presents with  . Lower Back - Pain   HPI:  Samantha Delacruz is a 56 y.o. female who comes in todayHPI ROS Otherwise per HPI.  Assessment & Plan: Visit Diagnoses: No diagnosis found.  Plan: No additional findings.   Meds & Orders: No orders of the defined types were placed in this encounter.  No orders of the defined types were placed in this encounter.   Follow-up: No follow-ups on file.   Procedures: No procedures performed      Clinical History: No specialty comments available.     Objective:  VS:  HT:    WT:   BMI:     BP:   HR: bpm  TEMP: ( )  RESP:  Physical Exam   Imaging: No results found.

## 2022-04-11 NOTE — Progress Notes (Unsigned)
Pt state lower back pain that travels to her buttocks, mostly her right side. Pt state driving and step to the side makes the pain worse. Pt state her takes over the counter pain meds to help ease her pain.  Numeric Pain Rating Scale and Functional Assessment Average Pain 5   In the last MONTH (on 0-10 scale) has pain interfered with the following?  1. General activity like being  able to carry out your everyday physical activities such as walking, climbing stairs, carrying groceries, or moving a chair?  Rating(10)   +Driver, -BT, +Dye Allergies.

## 2022-04-14 MED ORDER — METHYLPREDNISOLONE ACETATE 80 MG/ML IJ SUSP
80.0000 mg | INTRAMUSCULAR | Status: AC | PRN
Start: 2022-04-11 — End: 2022-04-11
  Administered 2022-04-11: 80 mg via INTRA_ARTICULAR

## 2022-04-14 MED ORDER — BUPIVACAINE HCL 0.5 % IJ SOLN
2.0000 mL | INTRAMUSCULAR | Status: AC | PRN
  Administered 2022-04-11: 2 mL via INTRA_ARTICULAR

## 2022-04-29 ENCOUNTER — Telehealth: Payer: Self-pay

## 2022-04-29 NOTE — Telephone Encounter (Signed)
Caller states that she would like to schedule an appt as she has a cough, and chest congestion. No fever. Caller states that she has had symptoms for a week, she did a covid test and it was negative. pt states about 1 week ago started with runny nose, drainage and sore throat. sore throat went away 3 days later. has congestion, cough and chest congestion. feels worse every day. has hx of asthma, but has not used inhaler.  04/28/2022 11:34:38 [employer], RN, Adriana  Comments User: Kizzie Fantasia, RN Date/Time Eilene Ghazi Time): 04/28/2022 11:16:06 AM no voicemail User: Kizzie Fantasia, RN Date/Time Eilene Ghazi Time): 04/28/2022 11:27:55 AM tested (-) for covid yesterday  Care Advice Given Per Guideline HOME CARE: * You should be able to treat this at home. CALL BACK IF: * You become worse CARE ADVICE given per Cough - Acute Productive (Adult) guideline. HOME CARE: * You should be able to treat this at home. CALL BACK IF: * You become worse CARE ADVICE given per Asthma Attack (Adult) guideline.  Pt has appt with PCP on 05/02/22

## 2022-05-02 ENCOUNTER — Ambulatory Visit: Payer: BC Managed Care – PPO | Admitting: Internal Medicine

## 2022-05-18 ENCOUNTER — Telehealth: Payer: Self-pay | Admitting: Internal Medicine

## 2022-05-18 MED ORDER — ALBUTEROL SULFATE HFA 108 (90 BASE) MCG/ACT IN AERS: INHALATION_SPRAY | RESPIRATORY_TRACT | 2 refills | Status: DC

## 2022-05-18 MED ORDER — FLUTICASONE-SALMETEROL 230-21 MCG/ACT IN AERO: INHALATION_SPRAY | RESPIRATORY_TRACT | 0 refills | Status: DC

## 2022-05-18 NOTE — Telephone Encounter (Signed)
Pt called to request a refill of the following:  fluticasone-salmeterol (ADVAIR HFA) 230-21 MCG/ACT inhaler  albuterol (PROAIR HFA) 108 (90 Base) MCG/ACT inhaler  LOV:  12/14/21 = CPE  Please advise.  [place], [place] Phone:  [phone]

## 2022-05-23 ENCOUNTER — Telehealth: Payer: Self-pay | Admitting: Internal Medicine

## 2022-05-23 DIAGNOSIS — M79641 Pain in right hand: Secondary | ICD-10-CM

## 2022-05-23 NOTE — Telephone Encounter (Signed)
Referral placed for right hand.

## 2022-05-23 NOTE — Telephone Encounter (Signed)
Pt called to say she hurt her hand with a combination of playing a game and too much work. Pt would like to know if MD wants her to come in for an OV or can MD just refer her to a hand doctor?  LOV:  12/14/21 = CPE  Please advise.

## 2022-05-26 ENCOUNTER — Ambulatory Visit: Payer: BC Managed Care – PPO | Admitting: Sports Medicine

## 2022-06-06 ENCOUNTER — Encounter: Payer: Self-pay | Admitting: Family Medicine

## 2022-06-06 ENCOUNTER — Ambulatory Visit: Payer: BC Managed Care – PPO | Admitting: Internal Medicine

## 2022-06-06 ENCOUNTER — Other Ambulatory Visit: Payer: BC Managed Care – PPO

## 2022-06-06 ENCOUNTER — Ambulatory Visit (INDEPENDENT_AMBULATORY_CARE_PROVIDER_SITE_OTHER): Payer: BC Managed Care – PPO | Admitting: Family Medicine

## 2022-06-06 VITALS — BP 142/90 | HR 96 | Temp 98.7°F | Wt 177.8 lb

## 2022-06-06 DIAGNOSIS — J309 Allergic rhinitis, unspecified: Secondary | ICD-10-CM | POA: Diagnosis not present

## 2022-06-06 DIAGNOSIS — R059 Cough, unspecified: Secondary | ICD-10-CM | POA: Diagnosis not present

## 2022-06-06 MED ORDER — FLUTICASONE PROPIONATE 50 MCG/ACT NA SUSP: 1.0000 | Freq: Every day | NASAL | 0 refills | Status: DC

## 2022-06-06 MED ORDER — FEXOFENADINE HCL 60 MG PO TABS: 60.0000 mg | ORAL_TABLET | Freq: Every evening | ORAL | 0 refills | Status: DC | PRN
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
## 2022-06-30 ENCOUNTER — Other Ambulatory Visit: Payer: Self-pay | Admitting: Internal Medicine

## 2022-06-30 DIAGNOSIS — I10 Essential (primary) hypertension: Secondary | ICD-10-CM

## 2022-07-21 ENCOUNTER — Emergency Department (HOSPITAL_COMMUNITY)
Admission: EM | Admit: 2022-07-21 | Discharge: 2022-07-21 | Discharge status: Against Medical Advice | Payer: BC Managed Care – PPO | Attending: Emergency Medicine | Admitting: Emergency Medicine

## 2022-07-21 ENCOUNTER — Other Ambulatory Visit: Payer: Self-pay

## 2022-07-21 DIAGNOSIS — T7840XA Allergy, unspecified, initial encounter: Secondary | ICD-10-CM | POA: Diagnosis present

## 2022-07-21 DIAGNOSIS — Z5321 Procedure and treatment not carried out due to patient leaving prior to being seen by health care provider: Secondary | ICD-10-CM | POA: Diagnosis not present

## 2022-07-21 NOTE — ED Triage Notes (Signed)
Patient coming to ED for evaluation of possible allergic reaction.  States she was using a new cleaner today and feels like she is having an allergic reaction to cleaner.  States she was not using gloves.  Noticed facial swelling and throat discomfort.  Took Benadryl 25 mg PO around 11:45 PM.  Symptoms have improved.  Never had to use an Epi Pen in the past

## 2022-07-22 ENCOUNTER — Telehealth: Payer: Self-pay

## 2022-07-22 NOTE — Telephone Encounter (Signed)
Transition Care Management Follow-up Telephone Call Date of discharge and from where: 07/21/22 from [facility]. LWBS How have you been since you were released from the hospital? Pt states she had an allergic reaction to "something, but doesn't know exactly what it is." States she WAS having facial swelling in check area. States facial swelling has resolved. States bouts of diarrhea; has had 2 loose stools in last 24hrs Any questions or concerns? No  Items Reviewed: Other?  States she increased her Valtrex, washed her sink with concentrated cleaner, states she stuck her hands in sink. Diet Coke burned her throat.  At ED did EKG, but left b/c she was placed back in waiting with sick people so she left.   Follow up appointments reviewed:  PCP Hospital f/u appt confirmed? No  declines at this time. If needed she will call back if needed. If their condition worsens, is the pt aware to call PCP or go to the Emergency Dept.? Yes Was the patient provided with contact information for the PCP's office or ED? Yes Was to pt encouraged to call back with questions or concerns? Yes

## 2022-07-25 ENCOUNTER — Other Ambulatory Visit: Payer: BC Managed Care – PPO

## 2022-08-05 ENCOUNTER — Ambulatory Visit (INDEPENDENT_AMBULATORY_CARE_PROVIDER_SITE_OTHER): Payer: BC Managed Care – PPO | Admitting: Family Medicine

## 2022-08-05 ENCOUNTER — Encounter: Payer: Self-pay | Admitting: Family Medicine

## 2022-08-05 VITALS — BP 125/88 | HR 101 | Temp 97.8°F | Ht 66.0 in

## 2022-08-05 DIAGNOSIS — J4520 Mild intermittent asthma, uncomplicated: Secondary | ICD-10-CM | POA: Diagnosis not present

## 2022-08-05 DIAGNOSIS — U071 COVID-19: Secondary | ICD-10-CM | POA: Diagnosis not present

## 2022-08-05 MED ORDER — AZITHROMYCIN 250 MG PO TABS: ORAL_TABLET | ORAL | 0 refills | Status: AC

## 2022-08-05 NOTE — Patient Instructions (Addendum)
Covid-stay at home for few more days, then mask for 5 more days.  Advair-2 puffs 2x/day for 5-7 days to keep lung inflammation down  Albuterol(proAir) 2 puffs every 4-6 hrs as needed for coughing a lot, tightness in chest, or shortness of breath.  Z pack antibiotic if worsening.     Call back to request transfer of care of doctors to this site-Dr. Cherlynn Kaiser.

## 2022-08-05 NOTE — Progress Notes (Signed)
Subjective:     Patient ID: Samantha Delacruz, female    DOB: 09-Nov-1965, 56 y.o.   MRN: [id number]  Chief Complaint  Patient presents with   Covid Positive    Tested positive for Covid on 08/04/2022 Sx started Tuesday   Sore Throat   Cough    Productive cough    Nasal Congestion   Weepy Eyes    HPI Since 12/19,Sore throat, congestion, cough. Weepy eyes. No fever.  [place]. Taste off.and smell.  Covid +-tested on 12/21.  Sob more cough related. Did have some pain midepi/reflux. Has asthma and HTN.  Not taking advair regularly.  Not using albuterol  Health Maintenance Due  Topic Date Due   PAP SMEAR-Modifier  10/02/2021    Past Medical History:  Diagnosis Date   Adult ADHD    Anemia    history of   Angio-edema    Asthma    Brain injury ([place])    Headache    Migraines   Heart palpitations    Hemorrhoids, internal, with bleeding and mucous discharge - Grade 1 02/05/2014   Herpes    Hypertension    Migraines    Pneumonia    Restless leg    Rosacea    Shingles    Urticaria    Vertigo     Past Surgical History:  Procedure Laterality Date   CESAREAN SECTION  08/15/92   son   COLONOSCOPY     DILATATION & CURETTAGE/HYSTEROSCOPY WITH MYOSURE N/A 04/19/2016   Procedure: [place];  Surgeon: Anastasio Auerbach, MD;  Location: [facility];  Service: Gynecology;  Laterality: N/A;  request 7:30am start time   Requests one hour OR time.    retinal peel left eye surgery     thrush      Outpatient Medications Prior to Visit  Medication Sig Dispense Refill   albuterol (PROAIR HFA) 108 (90 Base) MCG/ACT inhaler INHALE 2 PUFFS EVERY 4 HOURS AS NEEDED FOR WHEEZING OR SHORTNESS OF BREATH 8.5 each 2   amLODipine (NORVASC) 5 MG tablet Take 1 tablet (5 mg total) by mouth daily. 90 tablet 1   Chlorpheniramine Maleate (ALLERGY PO) Take by mouth.     EPINEPHrine (AUVI-Q) 0.3 mg/0.3 mL IJ SOAJ injection Inject 0.3 mg into the muscle as needed for  anaphylaxis. 1 each 1   fluticasone (FLONASE) 50 MCG/ACT nasal spray Place 1 spray into both nostrils daily. 16 g 0   fluticasone-salmeterol (ADVAIR HFA) 230-21 MCG/ACT inhaler TAKE 2 PUFFS BY MOUTH TWICE A DAY 24 each 0   Ginger, Zingiber officinalis, (GINGER PO) Take by mouth.     lidocaine (XYLOCAINE) 5 % ointment Apply 1 application topically 3 (three) times daily. 50 g 0   scopolamine (TRANSDERM-SCOP) 1 MG/3DAYS Place 1 patch (1.5 mg total) onto the skin every 3 (three) days. 10 patch 0   TURMERIC PO Take by mouth.     valACYclovir (VALTREX) 500 MG tablet Take 1 tablet (500 mg total) by mouth daily. 30 tablet 5   fexofenadine (ALLEGRA ALLERGY) 60 MG tablet Take 1 tablet (60 mg total) by mouth at bedtime as needed for allergies or rhinitis. 30 tablet 0   Facility-Administered Medications Prior to Visit  Medication Dose Route Frequency Provider Last Rate Last Admin   0.9 %  sodium chloride infusion  500 mL Intravenous Once Gatha Mayer, MD        Allergies  Allergen Reactions   Latex Rash   Green Dyes  Hives    Green, yellow, blue   Influenza Virus Vaccine Nausea And Vomiting   Blue Dyes (Parenteral) Hives   Codeine Hives   Lactose Diarrhea   Lactose Intolerance (Gi) Diarrhea    cramping   Meloxicam Other (See Comments)   Sulfa Antibiotics Hives   ROS neg/noncontributory except as noted HPI/below      Objective:     BP 125/88   Pulse (!) 101   Temp 97.8 F (36.6 C) (Oral)   Ht '5\' 6"'$  (1.676 m)   LMP 05/15/2017   BMI 29.05 kg/m  Wt Readings from Last 3 Encounters:  07/21/22 180 lb (81.6 kg)  06/06/22 177 lb 12.8 oz (80.6 kg)  12/14/21 176 lb 12.8 oz (80.2 kg)    Physical Exam   Gen: WDWN NAD HEENT: NCAT, conjunctiva not injected, sclera nonicteric TM WNL B, OP moist, no exudates  sl red CARDIAC: RRR, S1S2+, no murmur.  LUNGS: CTAB. No wheezes EXT:  no edema MSK: no gross abnormalities.  NEURO: A&O x3.  CN II-XII intact.  PSYCH: normal mood. Good eye  contact     Assessment & Plan:   Problem List Items Addressed This Visit       Respiratory   Asthma   Other Visit Diagnoses     COVID-19    -  Primary   Relevant Medications   azithromycin (ZITHROMAX) 250 MG tablet      Covid 19 -pt getting better.  Declines paxlovid d/t mult allergies.  Monitor closely.  Discussed isolation, etc.   Zpk to hold if worsening Asthma-chronic.  Stable.  Does not use advair regularly.  Advised to do 2 puffs bid for 5-7 days for the inflammation of covid.  Albuterol 2 puffs q 4-6 hrs prn.    Meds ordered this encounter  Medications   azithromycin (ZITHROMAX) 250 MG tablet    Sig: Take 2 tablets on day 1, then 1 tablet daily on days 2 through 5    Dispense:  6 tablet    Refill:  0    Wellington Hampshire, MD

## 2022-08-19 ENCOUNTER — Telehealth: Payer: Self-pay | Admitting: Internal Medicine

## 2022-08-19 ENCOUNTER — Other Ambulatory Visit: Payer: Self-pay | Admitting: Family Medicine

## 2022-08-19 MED ORDER — AZITHROMYCIN 250 MG PO TABS: ORAL_TABLET | ORAL | 0 refills | Status: AC

## 2022-08-19 NOTE — Telephone Encounter (Signed)
Please see message below

## 2022-08-19 NOTE — Telephone Encounter (Signed)
Patient is requesting to transfer care from Dr. Jerilee Hoh to Dr. Esther Hardy. Is this okay with you?

## 2022-08-19 NOTE — Telephone Encounter (Signed)
Patient states: - Saw Dr. Cherlynn Kaiser on 08/05/22 and was prescribed azithromycin  - Found out that azithromycin has dye in it; states she is allergic to dye and didn't want to chance it  - At the time she picked up the medication anyway but did not take the medication due to the dye and thinking she would be okay without it  - Having ongoing symptoms- thinks need medication  Patient requests: -Dr. Cherlynn Kaiser send azithromycin to [facility] pharmacy at [place], [place] [address]; States they have azithromycin without dye in it

## 2022-08-23 NOTE — Telephone Encounter (Signed)
Noted  

## 2022-08-25 ENCOUNTER — Ambulatory Visit
Admission: RE | Admit: 2022-08-25 | Discharge: 2022-08-25 | Disposition: A | Discharge status: Home / Self Care | Payer: BC Managed Care – PPO | Source: Ambulatory Visit | Attending: Internal Medicine | Admitting: Internal Medicine

## 2022-08-25 DIAGNOSIS — N6320 Unspecified lump in the left breast, unspecified quadrant: Secondary | ICD-10-CM

## 2022-09-01 ENCOUNTER — Other Ambulatory Visit: Payer: Self-pay | Admitting: Internal Medicine

## 2022-09-07 ENCOUNTER — Encounter: Payer: Self-pay | Admitting: Adult Health

## 2022-09-07 ENCOUNTER — Ambulatory Visit (INDEPENDENT_AMBULATORY_CARE_PROVIDER_SITE_OTHER): Payer: BC Managed Care – PPO | Admitting: Adult Health

## 2022-09-07 VITALS — BP 130/86 | HR 82 | Temp 97.4°F | Ht 66.0 in | Wt 180.0 lb

## 2022-09-07 DIAGNOSIS — H6993 Unspecified Eustachian tube disorder, bilateral: Secondary | ICD-10-CM

## 2022-09-07 DIAGNOSIS — J029 Acute pharyngitis, unspecified: Secondary | ICD-10-CM | POA: Diagnosis not present

## 2022-09-07 NOTE — Progress Notes (Signed)
Subjective:    Patient ID: Samantha Delacruz, female    DOB: 1965-12-09, 57 y.o.   MRN: [id number]  HPI 57 year old female who  has a past medical history of Adult ADHD, Anemia, Angio-edema, Asthma, Brain injury ([place]), Headache, Heart palpitations, Hemorrhoids, internal, with bleeding and mucous discharge - Grade 1 (02/05/2014), Herpes, Hypertension, Migraines, Pneumonia, Restless leg, Rosacea, Shingles, Urticaria, and Vertigo.  She presents to the office today for an acute issue of bilateral ear pain x 3 days.  She does report that today pain is better.  She also has a feeling of ear fullness, rhinorrhea, and postnasal drip mildly sore throat  He has not had any fevers or chills.   Review of Systems See HPI   Past Medical History:  Diagnosis Date   Adult ADHD    Anemia    history of   Angio-edema    Asthma    Brain injury ([place])    Headache    Migraines   Heart palpitations    Hemorrhoids, internal, with bleeding and mucous discharge - Grade 1 02/05/2014   Herpes    Hypertension    Migraines    Pneumonia    Restless leg    Rosacea    Shingles    Urticaria    Vertigo     Social History   Socioeconomic History   Marital status: Divorced    Spouse name: Not on file   Number of children: 1   Years of education: Not on file   Highest education level: Not on file  Occupational History    Employer: [employer]  Tobacco Use   Smoking status: Never   Smokeless tobacco: Never  Vaping Use   Vaping Use: Never used  Substance and Sexual Activity   Alcohol use: Not Currently    Alcohol/week: 0.0 standard drinks of alcohol   Drug use: No   Sexual activity: Yes    Partners: Male    Birth control/protection: Post-menopausal  Other Topics Concern   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    retinal peel left eye surgery     thrush      Family History  Problem Relation Age of Onset   Depression Mother    Diabetes Mother    Emphysema Mother    Stroke Mother    Cancer Mother        uterine    Heart disease Father    Heart failure Father    Thyroid disease Sister    Thyroid disease Brother    ADD / ADHD Son  Stroke Maternal Grandmother    Cancer Paternal Grandfather        esophgus    Colon cancer Neg Hx     Allergies  Allergen Reactions   Latex Rash   Green Dyes Hives    Green, yellow, blue   Influenza Virus Vaccine Nausea And Vomiting   Blue Dyes (Parenteral) Hives   Codeine Hives   Lactose Diarrhea   Lactose Intolerance (Gi) Diarrhea    cramping   Meloxicam Other (See Comments)   Sulfa Antibiotics Hives    Current Outpatient Medications on File Prior to Visit  Medication Sig Dispense Refill   albuterol (PROAIR HFA) 108 (90 Base) MCG/ACT inhaler INHALE 2 PUFFS EVERY 4 HOURS AS NEEDED FOR WHEEZING OR SHORTNESS OF BREATH 8.5 each 2   amLODipine (NORVASC) 5 MG tablet Take 1 tablet (5 mg total) by mouth daily. 90 tablet 1   Chlorpheniramine Maleate (ALLERGY PO) Take by mouth.     EPINEPHrine (AUVI-Q) 0.3 mg/0.3 mL IJ SOAJ injection Inject 0.3 mg into the muscle as needed for anaphylaxis. 1 each 1   fluticasone (FLONASE)  50 MCG/ACT nasal spray Place 1 spray into both nostrils daily. 16 g 0   fluticasone-salmeterol (ADVAIR HFA) 230-21 MCG/ACT inhaler INHALE 2 PUFFS TWICE A DAY 12 each 0   Ginger, Zingiber officinalis, (GINGER PO) Take by mouth.     lidocaine (XYLOCAINE) 5 % ointment Apply 1 application topically 3 (three) times daily. 50 g 0   TURMERIC PO Take by mouth.     valACYclovir (VALTREX) 500 MG tablet Take 1 tablet (500 mg total) by mouth daily. 30 tablet 5   Current Facility-Administered Medications on File Prior to Visit  Medication Dose Route Frequency Provider Last Rate Last Admin   0.9 %  sodium chloride infusion  500 mL Intravenous Once Gatha Mayer, MD        BP 130/86   Pulse 82   Temp (!) 97.4 F (36.3 C) (Oral)   Ht '5\' 6"'$  (1.676 m)   Wt 180 lb (81.6 kg)   LMP 05/15/2017   SpO2 98%   BMI 29.05 kg/m       Objective:   Physical Exam Vitals and nursing note reviewed.  Constitutional:      Appearance: Normal appearance.  HENT:     Right Ear: A middle ear effusion is present. There is no impacted cerumen. Tympanic membrane is not erythematous or bulging.     Left Ear: A middle ear effusion is present. There is no impacted cerumen. Tympanic membrane is not erythematous or bulging.     Nose: No congestion or rhinorrhea.     Right Nostril: No occlusion.     Left Nostril: No occlusion.     Right Turbinates: Not enlarged or swollen.     Left Turbinates: Not enlarged or swollen.     Mouth/Throat:     Lips: Pink.     Mouth: Mucous membranes are moist.     Pharynx: Oropharynx is clear. Uvula midline. No pharyngeal swelling or posterior oropharyngeal erythema.     Tonsils: No tonsillar exudate or tonsillar abscesses.  Skin:    General: Skin is warm and dry.  Neurological:     General: No focal deficit present.     Mental Status: She is alert and oriented to person, place, and time.  Psychiatric:        Mood and Affect: Mood normal.        Behavior: Behavior normal.  Assessment & Plan:  1. Eustachian tube dysfunction, bilateral -No signs of infection.  Advised Flonase.  Follow-up with PCP as needed  2. Sore throat -Not painful today in the office.  No signs of strep.  Possibly from postnasal drip.  Flonase should help with this as well  Dorothyann Peng, NP

## 2022-09-19 ENCOUNTER — Encounter: Payer: Self-pay | Admitting: Family Medicine

## 2022-09-19 ENCOUNTER — Ambulatory Visit: Payer: BC Managed Care – PPO | Admitting: Family Medicine

## 2022-09-19 VITALS — BP 124/78 | HR 89 | Temp 98.5°F | Ht 66.0 in | Wt 177.5 lb

## 2022-09-19 DIAGNOSIS — R1319 Other dysphagia: Secondary | ICD-10-CM | POA: Diagnosis not present

## 2022-09-19 DIAGNOSIS — K219 Gastro-esophageal reflux disease without esophagitis: Secondary | ICD-10-CM

## 2022-09-19 DIAGNOSIS — I10 Essential (primary) hypertension: Secondary | ICD-10-CM | POA: Diagnosis not present

## 2022-09-19 DIAGNOSIS — R0789 Other chest pain: Secondary | ICD-10-CM | POA: Diagnosis not present

## 2022-09-19 MED ORDER — OMEPRAZOLE 40 MG PO CPDR: 40.0000 mg | DELAYED_RELEASE_CAPSULE | Freq: Two times a day (BID) | ORAL | 1 refills | Status: DC

## 2022-09-19 NOTE — Progress Notes (Signed)
Subjective:     Patient ID: Samantha Delacruz, female    DOB: [birth year], 57 y.o.   MRN: [id number]  Chief Complaint  Patient presents with   Transfer of Care    Transfer of Care     HPI TOC  HTN-Pt is on amlodipine '5mg'$ .  FH valve issues.  Bp's running -not really checking.  No dizziness/palp/edema/cough/sob. Has dec salt. No exercise since injury R hand..    CP since covid in December. Some GERD.  Pain behind sternum.  Random-any time.  Sis has had ablations.  FH valve issues.  No SOB. Lasts few minutes.  No dizziness/sweats. Some GERD-dysphagia for over 1 yr. Pills, meats, can choke on fluids-no h/o EGD When gets virus, gets fecal incont.  Had colon in past. Gets "stiffness" RLQ-occurring now but better on flagyl.   When sick, everything goes haywire.  Got spots behind R ear and L nose-better since on Flagyl for BV.  When eats sugars/carbs-everything hurts past 3 wks.   Hives/allergies flaring intermitt  Health Maintenance Due  Topic Date Due   PAP SMEAR-Modifier  10/02/2021    Past Medical History:  Diagnosis Date   Adult ADHD    Anemia    history of   Angio-edema    Asthma    Brain injury ([place])    Headache    Migraines   Heart palpitations    Hemorrhoids, internal, with bleeding and mucous discharge - Grade 1 02/05/2014   Herpes    Hypertension    Migraines    Pneumonia    PTSD (post-traumatic stress disorder)    has a stalker   Restless leg    Rosacea    Shingles    Urticaria    Vertigo     Past Surgical History:  Procedure Laterality Date   CESAREAN SECTION  08/15/1992   son   COLONOSCOPY     DILATATION & CURETTAGE/HYSTEROSCOPY WITH MYOSURE N/A 04/19/2016   Procedure: [place];  Surgeon: Anastasio Auerbach, MD;  Location: [facility];  Service: Gynecology;  Laterality: N/A;  request 7:30am start time   Requests one hour OR time.    retinal peel left eye surgery Left    retina and cataract   thrush       Outpatient Medications Prior to Visit  Medication Sig Dispense Refill   albuterol (PROAIR HFA) 108 (90 Base) MCG/ACT inhaler INHALE 2 PUFFS EVERY 4 HOURS AS NEEDED FOR WHEEZING OR SHORTNESS OF BREATH 8.5 each 2   amLODipine (NORVASC) 5 MG tablet Take 1 tablet (5 mg total) by mouth daily. 90 tablet 1   Chlorpheniramine Maleate (ALLERGY PO) Take by mouth.     EPINEPHrine (AUVI-Q) 0.3 mg/0.3 mL IJ SOAJ injection Inject 0.3 mg into the muscle as needed for anaphylaxis. 1 each 1   fluconazole (DIFLUCAN) 150 MG tablet take 1 tablet po stat, repeat in 3 days     fluticasone (FLONASE) 50 MCG/ACT nasal spray Place 1 spray into both nostrils daily. 16 g 0   fluticasone-salmeterol (ADVAIR HFA) 230-21 MCG/ACT inhaler INHALE 2 PUFFS TWICE A DAY 12 each 0   Ginger, Zingiber officinalis, (GINGER PO) Take by mouth.     metroNIDAZOLE (FLAGYL) 500 MG tablet take 1 tablet po bid x 7 days     TURMERIC PO Take by mouth.     valACYclovir (VALTREX) 500 MG tablet Take 1 tablet (500 mg total) by mouth daily. 30 tablet 5   lidocaine (XYLOCAINE) 5 %  ointment Apply 1 application topically 3 (three) times daily. 50 g 0   Facility-Administered Medications Prior to Visit  Medication Dose Route Frequency Provider Last Rate Last Admin   0.9 %  sodium chloride infusion  500 mL Intravenous Once Gatha Mayer, MD        Allergies  Allergen Reactions   Latex Rash   Green Dyes Hives    Green, yellow, blue   Influenza Virus Vaccine Nausea And Vomiting   Blue Dyes (Parenteral) Hives   Codeine Hives   Lactose Diarrhea   Lactose Intolerance (Gi) Diarrhea    cramping   Meloxicam Other (See Comments)   Sulfa Antibiotics Hives   ROS neg/noncontributory except as noted HPI/below H/o migraine so freq ha.  Cold only thing that works. Did cosmetic botox one long time ago.       Objective:     BP 124/78   Pulse 89   Temp 98.5 F (36.9 C) (Temporal)   Ht '5\' 6"'$  (1.676 m)   Wt 177 lb 8 oz (80.5 kg)   LMP  05/15/2017   SpO2 97%   BMI 28.65 kg/m  Wt Readings from Last 3 Encounters:  09/19/22 177 lb 8 oz (80.5 kg)  09/07/22 180 lb (81.6 kg)  07/21/22 180 lb (81.6 kg)    Physical Exam   Gen: WDWN NAD HEENT: NCAT, conjunctiva not injected, sclera nonicteric OP normal NECK:  supple, no thyromegaly, no nodes, no carotid bruits CARDIAC: RRR, S1S2+, no murmur. DP 2+B LUNGS: CTAB. No wheezes ABDOMEN:  BS+, soft, mildly tender diffusely, No HSM, no masses EXT:  no edema MSK: no gross abnormalities.  NEURO: A&O x3.  CN II-XII intact.  PSYCH: normal mood. Good eye contact  EKG: NSR RsR.  No st changes.      Assessment & Plan:   Problem List Items Addressed This Visit       Cardiovascular and Mediastinum   HTN (hypertension)   Other Visit Diagnoses     Other chest pain    -  Primary   Relevant Orders   EKG 12-Lead   Comprehensive metabolic panel   CBC with Differential/Platelet   Amylase   Lipase   Gastroesophageal reflux disease without esophagitis       Relevant Medications   omeprazole (PRILOSEC) 40 MG capsule   Other Relevant Orders   Comprehensive metabolic panel   CBC with Differential/Platelet   Amylase   Lipase   Ambulatory referral to Gastroenterology   Other dysphagia       Relevant Orders   Ambulatory referral to Gastroenterology     1.  Hypertension-chronic.  Well-controlled.  Continue amlodipine 5 mg daily.  Advised to monitor blood pressures at least monthly.  Work on diet/exercise. 2.  Atypical chest pain-since COVID.  EKG is fairly unremarkable.  Not sure if this is just post COVID, GERD, other.  Patient is vague about symptoms.  Will check CBC, CMP, amylase, lipase.  Worse, other symptoms, ER.  Will treat as GERD/dysphagia for now. 3.  GERD-newer diagnosis.  Omeprazole 40 mg twice daily.  Check CBC, CMP, amylase, lipase. 4.  Dysphagia-going on for at least a year.  Now having some symptoms of GERD.  Will do omeprazole 40 mg twice daily, refer to  GI.  Follow-up in 1 month-multiple.  Meds ordered this encounter  Medications   omeprazole (PRILOSEC) 40 MG capsule    Sig: Take 1 capsule (40 mg total) by mouth in the morning and at bedtime.  Dispense:  60 capsule    Refill:  1    Wellington Hampshire, MD

## 2022-09-19 NOTE — Patient Instructions (Signed)
It was very nice to see you today!  Sending omeprazole to pharmacy and referral to GI.   PLEASE NOTE:  If you had any lab tests please let us know if you have not heard back within a few days. You may see your results on MyChart before we have a chance to review them but we will give you a call once they are reviewed by [place]. If we ordered any referrals today, please let us know if you have not heard from their office within the next week.   Please try these tips to maintain a healthy lifestyle:  Eat most of your calories during the day when you are active. Eliminate processed foods including packaged sweets (pies, cakes, cookies), reduce intake of potatoes, white bread, white pasta, and white rice. Look for whole grain options, oat flour or almond flour.  Each meal should contain half fruits/vegetables, one quarter protein, and one quarter carbs (no bigger than a computer mouse).  Cut down on sweet beverages. This includes juice, soda, and sweet tea. Also watch fruit intake, though this is a healthier sweet option, it still contains natural sugar! Limit to 3 servings daily.  Drink at least 1 glass of water with each meal and aim for at least 8 glasses per day  Exercise at least 150 minutes every week.

## 2022-09-20 LAB — CBC WITH DIFFERENTIAL/PLATELET
Basophils Absolute: 0.1 10*3/uL (ref 0.0–0.1)
Basophils Relative: 0.6 % (ref 0.0–3.0)
Eosinophils Absolute: 0.2 10*3/uL (ref 0.0–0.7)
Eosinophils Relative: 2.1 % (ref 0.0–5.0)
HCT: 41.1 % (ref 36.0–46.0)
Hemoglobin: 14.1 g/dL (ref 12.0–15.0)
Lymphocytes Relative: 31.2 % (ref 12.0–46.0)
Lymphs Abs: 2.8 10*3/uL (ref 0.7–4.0)
MCHC: 34.4 g/dL (ref 30.0–36.0)
MCV: 87.7 fl (ref 78.0–100.0)
Monocytes Absolute: 0.8 10*3/uL (ref 0.1–1.0)
Monocytes Relative: 8.8 % (ref 3.0–12.0)
Neutro Abs: 5.1 10*3/uL (ref 1.4–7.7)
Neutrophils Relative %: 57.3 % (ref 43.0–77.0)
Platelets: 412 10*3/uL — ABNORMAL HIGH (ref 150.0–400.0)
RBC: 4.68 Mil/uL (ref 3.87–5.11)
RDW: 12.9 % (ref 11.5–15.5)
WBC: 8.9 10*3/uL (ref 4.0–10.5)

## 2022-09-20 LAB — COMPREHENSIVE METABOLIC PANEL
ALT: 19 U/L (ref 0–35)
AST: 20 U/L (ref 0–37)
Albumin: 4.6 g/dL (ref 3.5–5.2)
Alkaline Phosphatase: 71 U/L (ref 39–117)
BUN: 21 mg/dL (ref 6–23)
CO2: 22 mEq/L (ref 19–32)
Calcium: 9.6 mg/dL (ref 8.4–10.5)
Chloride: 104 mEq/L (ref 96–112)
Creatinine, Ser: 0.78 mg/dL (ref 0.40–1.20)
GFR: 84.05 mL/min (ref >60.00)
Glucose, Bld: 91 mg/dL (ref 70–99)
Potassium: 4.3 mEq/L (ref 3.5–5.1)
Sodium: 139 mEq/L (ref 135–145)
Total Bilirubin: 0.3 mg/dL (ref 0.2–1.2)
Total Protein: 7.2 g/dL (ref 6.0–8.3)

## 2022-09-20 LAB — LIPASE: Lipase: 29 U/L (ref 11.0–59.0)

## 2022-09-20 LAB — AMYLASE: Amylase: 43 U/L (ref 27–131)

## 2022-09-23 ENCOUNTER — Encounter: Payer: Self-pay | Admitting: *Deleted

## 2022-09-23 ENCOUNTER — Encounter: Payer: Self-pay | Admitting: Family Medicine

## 2022-10-19 ENCOUNTER — Ambulatory Visit: Payer: BC Managed Care – PPO | Admitting: Family Medicine

## 2022-10-19 ENCOUNTER — Encounter: Payer: Self-pay | Admitting: Family Medicine

## 2022-10-19 VITALS — BP 108/70 | HR 89 | Temp 98.3°F | Ht 66.0 in | Wt 176.1 lb

## 2022-10-19 DIAGNOSIS — K219 Gastro-esophageal reflux disease without esophagitis: Secondary | ICD-10-CM | POA: Diagnosis not present

## 2022-10-19 DIAGNOSIS — R0789 Other chest pain: Secondary | ICD-10-CM | POA: Diagnosis not present

## 2022-10-19 DIAGNOSIS — B370 Candidal stomatitis: Secondary | ICD-10-CM | POA: Diagnosis not present

## 2022-10-19 DIAGNOSIS — R1084 Generalized abdominal pain: Secondary | ICD-10-CM | POA: Diagnosis not present

## 2022-10-19 MED ORDER — NYSTATIN 100000 UNIT/ML MT SUSP: 5.0000 mL | Freq: Four times a day (QID) | OROMUCOSAL | 0 refills | Status: DC

## 2022-10-19 MED ORDER — PANTOPRAZOLE SODIUM 40 MG PO TBEC: 40.0000 mg | DELAYED_RELEASE_TABLET | Freq: Two times a day (BID) | ORAL | 3 refills | Status: DC

## 2022-10-19 NOTE — Progress Notes (Unsigned)
Subjective:     Patient ID: Samantha Delacruz, female    DOB: 1966-07-27, 57 y.o.   MRN: [id number]  Chief Complaint  Patient presents with   Follow-up    1 month follow-up GI appt. in May Still having white tongue/oral thrush Discuss times she should be taking medications    HPI 1   atypical cp since covid.  Some stress. Poss some improvement but not much 2  GERD-on omeprazole '40mg'$  bid-has blue dye so issues.  Sees GI 5/7.  No throat tightening. Does wake up w/pain whole chest/abd area.   3.  Still w/ white tongue/thrush-not using advair since covid end of Dec.  Was on abx in past.   Gyn-practice is  redefined for her-Dr. Dr. Marlaine Hind Powell-recent pap  Health Maintenance Due  Topic Date Due   PAP SMEAR-Modifier  10/02/2021    Past Medical History:  Diagnosis Date   Adult ADHD    Anemia    history of   Angio-edema    Asthma    Brain injury ([place])    Headache    Migraines   Heart palpitations    Hemorrhoids, internal, with bleeding and mucous discharge - Grade 1 02/05/2014   Herpes    Hypertension    Migraines    Pneumonia    PTSD (post-traumatic stress disorder)    has a stalker   Restless leg    Rosacea    Shingles    Urticaria    Vertigo     Past Surgical History:  Procedure Laterality Date   CESAREAN SECTION  08/15/1992   son   COLONOSCOPY     DILATATION & CURETTAGE/HYSTEROSCOPY WITH MYOSURE N/A 04/19/2016   Procedure: [place];  Surgeon: Anastasio Auerbach, MD;  Location: [facility];  Service: Gynecology;  Laterality: N/A;  request 7:30am start time   Requests one hour OR time.    retinal peel left eye surgery Left    retina and cataract   thrush      Outpatient Medications Prior to Visit  Medication Sig Dispense Refill   albuterol (PROAIR HFA) 108 (90 Base) MCG/ACT inhaler INHALE 2 PUFFS EVERY 4 HOURS AS NEEDED FOR WHEEZING OR SHORTNESS OF BREATH 8.5 each 2   amLODipine (NORVASC) 5 MG tablet Take 1 tablet  (5 mg total) by mouth daily. 90 tablet 1   Chlorpheniramine Maleate (ALLERGY PO) Take by mouth.     EPINEPHrine (AUVI-Q) 0.3 mg/0.3 mL IJ SOAJ injection Inject 0.3 mg into the muscle as needed for anaphylaxis. 1 each 1   fluticasone (FLONASE) 50 MCG/ACT nasal spray Place 1 spray into both nostrils daily. 16 g 0   fluticasone-salmeterol (ADVAIR HFA) 230-21 MCG/ACT inhaler INHALE 2 PUFFS TWICE A DAY 12 each 0   Ginger, Zingiber officinalis, (GINGER PO) Take by mouth.     TURMERIC PO Take by mouth.     valACYclovir (VALTREX) 500 MG tablet Take 1 tablet (500 mg total) by mouth daily. 30 tablet 5   omeprazole (PRILOSEC) 40 MG capsule Take 1 capsule (40 mg total) by mouth in the morning and at bedtime. 60 capsule 1   fluconazole (DIFLUCAN) 150 MG tablet take 1 tablet po stat, repeat in 3 days (Patient not taking: Reported on 10/19/2022)     metroNIDAZOLE (FLAGYL) 500 MG tablet take 1 tablet po bid x 7 days (Patient not taking: Reported on 10/19/2022)     Facility-Administered Medications Prior to Visit  Medication Dose Route Frequency Provider Last  Rate Last Admin   0.9 %  sodium chloride infusion  500 mL Intravenous Once Gatha Mayer, MD        Allergies  Allergen Reactions   Latex Rash   Green Dyes Hives    Green, yellow, blue   Influenza Virus Vaccine Nausea And Vomiting   Blue Dyes (Parenteral) Hives   Codeine Hives   Lactose Diarrhea   Lactose Intolerance (Gi) Diarrhea    cramping   Meloxicam Other (See Comments)   Sulfa Antibiotics Hives   ROS neg/noncontributory except as noted HPI/below Walking for exercise      Objective:     BP 108/70   Pulse 89   Temp 98.3 F (36.8 C) (Temporal)   Ht '5\' 6"'$  (1.676 m)   Wt 176 lb 2 oz (79.9 kg)   LMP 05/15/2017   SpO2 97%   BMI 28.43 kg/m  Wt Readings from Last 3 Encounters:  10/19/22 176 lb 2 oz (79.9 kg)  09/19/22 177 lb 8 oz (80.5 kg)  09/07/22 180 lb (81.6 kg)    Physical Exam   Gen: WDWN NAD HEENT: NCAT, conjunctiva  not injected, sclera nonicteric Tongue white-not bleed.  Not on rest of mouth NECK:  supple, no thyromegaly, no nodes, no carotid bruits CARDIAC: RRR, S1S2+, no murmur. DP 2+B LUNGS: CTAB. No wheezes ABDOMEN:  BS+, soft, diffusely tender-mild, No HSM, no masses EXT:  no edema MSK: no gross abnormalities.  NEURO: A&O x3.  CN II-XII intact.  PSYCH: normal mood. Good eye contact     Assessment & Plan:   Problem List Items Addressed This Visit   None Visit Diagnoses     Generalized abdominal pain    -  Primary   Relevant Orders   US Abdomen Complete   Other chest pain       Gastroesophageal reflux disease without esophagitis       Relevant Medications   pantoprazole (PROTONIX) 40 MG tablet   Thrush       Relevant Medications   nystatin (MYCOSTATIN) 100000 UNIT/ML suspension     1.  Generalized abdominal pain-not sure if just GERD, ulcer, gallbladder, other.  Will check abdominal ultrasound 2.  GERD-possibly will some improvement on omeprazole, however it has blue dye in it which patient is allergic to.  She has been trying to open capsules.  Will change to Protonix 40 mg twice daily.  She has a follow-up with GI in May.  Dysphagia does seem to be a little better. 3.  Atypical chest pain-seems to be more GI related.  Continue to monitor.  Will continue to get GI controlled. 4.  Possible thrush-just the tongue.  Could be medication.  Will do nystatin swish and swallow.  She has not used her Advair for several months so I do not think that is the etiology.  She has been on antibiotics.  Let me know if not resolving.  F/u 40m Meds ordered this encounter  Medications   pantoprazole (PROTONIX) 40 MG tablet    Sig: Take 1 tablet (40 mg total) by mouth 2 (two) times daily before a meal.    Dispense:  60 tablet    Refill:  3   nystatin (MYCOSTATIN) 100000 UNIT/ML suspension    Sig: Take 5 mLs (500,000 Units total) by mouth 4 (four) times daily.    Dispense:  60 mL    Refill:  0     AWellington Hampshire MD

## 2022-10-19 NOTE — Patient Instructions (Addendum)
pantoprazole at breakfast and supper  Nstatin for mouth  Ultrasound abd-[facility]- [phone] for mammogram, other studies   Hold garlic for 1 wk.    Try taking all the meds together

## 2022-10-27 ENCOUNTER — Encounter: Payer: Self-pay | Admitting: Family Medicine

## 2022-11-18 ENCOUNTER — Ambulatory Visit
Admission: RE | Admit: 2022-11-18 | Discharge: 2022-11-18 | Disposition: A | Discharge status: Home / Self Care | Payer: BC Managed Care – PPO | Source: Ambulatory Visit | Attending: Family Medicine | Admitting: Family Medicine

## 2022-11-18 DIAGNOSIS — R1084 Generalized abdominal pain: Secondary | ICD-10-CM

## 2022-11-28 ENCOUNTER — Encounter: Payer: Self-pay | Admitting: Internal Medicine

## 2022-11-28 ENCOUNTER — Telehealth: Payer: Self-pay | Admitting: Family Medicine

## 2022-11-28 ENCOUNTER — Ambulatory Visit: Payer: BC Managed Care – PPO | Admitting: Internal Medicine

## 2022-11-28 VITALS — BP 132/80 | HR 134 | Temp 100.0°F | Ht 66.0 in | Wt 168.0 lb

## 2022-11-28 DIAGNOSIS — G43809 Other migraine, not intractable, without status migrainosus: Secondary | ICD-10-CM | POA: Diagnosis not present

## 2022-11-28 DIAGNOSIS — J4531 Mild persistent asthma with (acute) exacerbation: Secondary | ICD-10-CM | POA: Diagnosis not present

## 2022-11-28 DIAGNOSIS — F419 Anxiety disorder, unspecified: Secondary | ICD-10-CM

## 2022-11-28 DIAGNOSIS — I10 Essential (primary) hypertension: Secondary | ICD-10-CM | POA: Diagnosis not present

## 2022-11-28 DIAGNOSIS — R000 Tachycardia, unspecified: Secondary | ICD-10-CM

## 2022-11-28 MED ORDER — PREDNISONE 20 MG PO TABS: 40.0000 mg | ORAL_TABLET | Freq: Every day | ORAL | 0 refills | Status: AC

## 2022-11-28 NOTE — Telephone Encounter (Signed)
FYI: This call has been transferred to Access Nurse. Once the result note has been entered staff can address the message at that time.  Patient called in with the following symptoms:  Red Word: Breathing problems, crackling noises in lungs, and had to use rescue inhaler twice   Please advise at Mobile [phone] (mobile)  Message is routed to Provider Pool and [facility] Triage

## 2022-11-28 NOTE — Telephone Encounter (Signed)
FYI

## 2022-11-28 NOTE — Telephone Encounter (Signed)
Spoke with Access Nurse - stated to be seen within the hr. Scheduled with Dr Cheryll Cockayne at [facility].   Disposition states: Go to ED but we were not told this.  Patient Name: Samantha Delacruz [facility] Gender: Female DOB: 06/15/1966 Age: 57 Y 21 D Return Phone Number: [phone] (Primary) Address: City/ State/ Zip: [place]  [address] Client [facility] - Administrator, [facility] at [facility] Provider [name], [name] Contact Type Call Who Is Calling Patient / Member / Family / Caregiver Call Type Triage / Clinical Relationship To Patient Self Return Phone Number [phone] (Primary) Chief Complaint BREATHING - shortness of breath or sounds breathless Reason for Call Symptomatic / Request for Health Information Initial Comment Caller states she has breathing issues and used her rescue inhaler 2x and is having crackling when breathing. Translation No Nurse Assessment Nurse: Ericka Pontiff, RN, [place] Date/Time [name] Time): 11/28/2022 12:46:13 PM Confirm and document reason for call. If symptomatic, describe symptoms. ---Caller states she has breathing issues and used her rescue inhaler 2x . She used it last night and this morning. Caller has asthma Does the patient have any new or worsening symptoms? ---Yes Will a triage be completed? ---Yes Related visit to physician within the last 2 weeks? ---No Does the PT have any chronic conditions? (i.e. diabetes, asthma, this includes High risk factors for pregnancy, etc.) ---Yes List chronic conditions. ---asthma Is this a behavioral health or substance abuse call? ---No Nurse: Ericka Pontiff, RN, [place] Date/Time (Eastern Time): 11/28/2022 1:40:34 PM Confirm and document reason for call. If symptomatic, describe symptoms. ---Caller states she has done two rescue treatment and is feeling better. Does the patient have any new or worsening symptoms? ---Yes Will a triage  be completed? ---Yes Related visit to physician within the last 2 weeks? ---No Does the PT have any chronic conditions? (i.e. diabetes, asthma, this includes High risk factors for pregnancy, etc.) ---Yes List chronic conditions. ---asthma Is this a behavioral health or substance abuse call? ---No Guidelines Guideline Title Affirmed Question Affirmed Notes Nurse Date/Time (Eastern Time) Asthma Attack [1] Wheezing or coughing AND [2] hasn't used neb or inhaler (up to 3 treatments given 20 minutes apart) AND [3] it's available Ericka Pontiff, RN, [place] 11/28/2022 12:47:48 PM Asthma Attack [1] MODERATE asthma attack (e.g., SOB at rest, speaks in phrases, audible wheezes) AND [2] not resolved after 2 or 3 inhaler or nebulizer treatments given 20 minutes apart Wylene Simmer, [place] 11/28/2022 1:42:35 PM Disp. Time [name] Time) Disposition Final User 11/28/2022 12:43:01 PM Send to Urgent Queue Izora Gala 11/28/2022 12:55:31 PM Urgent Home Treatment with FollowUp Call Ericka Pontiff, RN, Lowanda Foster 11/28/2022 12:57:40 PM Send To RN Personal Ericka Pontiff, RN, Lowanda Foster 11/28/2022 1:06:42 PM Send To RN Personal Ericka Pontiff, RN, Lowanda Foster 11/28/2022 1:48:10 PM Go to ED Now (or PCP triage) Yes Ericka Pontiff, RN, [place] Final Disposition 11/28/2022 1:48:10 PM Go to ED Now (or PCP triage) Yes Ericka Pontiff, RN, Theodosia Quay Disagree/Comply Disagree Caller Understands Yes PreDisposition Call Doctor Care Advice Given Per Guideline URGENT HOME TREATMENT WITH FOLLOW-UP CALL: ASTHMA ATTACK - TREATMENT - QUICK-RELIEF MEDICINE: * During an ASTHMA ATTACK use your SHORT-ACTING QUICK-RELIEF INHALER (4 puffs, such as ALBUTEROL) or nebulizer up to 3 times, every 20 minutes as needed. CALL BACK IF: * You become worse before the nurse talks with you again on the follow-up call * The nurse hasn't called back within 60 minutes. * I'll call you back in 30-60 minutes to see how you are  doing. * Call me  back immediately if: you become worse before my follow-up call. CARE ADVICE given per Asthma Attack (Adult) guideline. GO TO ED NOW (OR PCP TRIAGE): * IF NO PCP (PRIMARY CARE PROVIDER) SECOND-LEVEL TRIAGE: You need to be seen within the next hour. Go to the ED/UCC at _____________ Hospital. Leave as soon as you can. Comments User: Luciana Axe, RN Date/Time [name] Time): 11/28/2022 1:45:12 PM Caller would like to been seen for an appointment with PCP instead of urgent care/ED User: Luciana Axe, RN Date/Time [name] Time): 11/28/2022 1:47:39 PM Contacted backline and office reports they will call patient back. Referrals GO TO FACILITY REFUSED

## 2022-11-28 NOTE — Progress Notes (Signed)
Subjective:    Patient ID: Samantha Delacruz, female    DOB: 1966/02/11, 57 y.o.   MRN: [id number]      HPI Samantha Delacruz is here for  Chief Complaint  Patient presents with   Respiratory Distress    When exhaling she hears a crunching sound in her chest , has been feeling anxious for the past week and has been pale , feeling numbness and tingling in toes and hands      About 10 days ago - numbness in feet and hands.  That has resolved and not recurred.  Recently she has stopped her mvi, garlic and went gluten-free for gut issues.  She does not appointment to see GI next month.  Abdominal ultrasound was normal.  She does have some upper abdominal pain.  For the past 10 days or so she has not felt herself.  She is felt anxious.  She is felt lightheaded.  She feels like her brain is not good and her heart is not good.    Last night she did not sleep well.  She got a migraine.  She may need to in the dark, had some caffeine and typically that helps.  She typically does not take anything for her migraines.  She still has a migraine today, but it is better.  She did end up taking some more caffeine this morning which she has been trying to avoid.   For the past few days when she exhales she has noticed a crunching sound.  This morning when she was breathing out she heard a snapping, crackling and popping.  She has been using her albuterol inhaler and that does help.  She has not started her Advair inhaler which she typically only uses as needed in the fall.  She is a minimal, occasional dry hacking cough.  There is no mucus.  She states some shortness of breath, wheezing.  She did feel little chest tightness and palpitations.Palpitations and not anything new.   She did call her PCPs office and she spoke with the nurse.  She did advise 4 puffs of albuterol every 20 minutes for an hour, which she just did and she is feeling more anxious and having palpitations because of  that.     Medications and allergies reviewed with patient and updated if appropriate.  Current Outpatient Medications on File Prior to Visit  Medication Sig Dispense Refill   albuterol (PROAIR HFA) 108 (90 Base) MCG/ACT inhaler INHALE 2 PUFFS EVERY 4 HOURS AS NEEDED FOR WHEEZING OR SHORTNESS OF BREATH 8.5 each 2   amLODipine (NORVASC) 5 MG tablet Take 1 tablet (5 mg total) by mouth daily. 90 tablet 1   Chlorpheniramine Maleate (ALLERGY PO) Take by mouth.     EPINEPHrine (AUVI-Q) 0.3 mg/0.3 mL IJ SOAJ injection Inject 0.3 mg into the muscle as needed for anaphylaxis. 1 each 1   fluticasone (FLONASE) 50 MCG/ACT nasal spray Place 1 spray into both nostrils daily. 16 g 0   fluticasone-salmeterol (ADVAIR HFA) 230-21 MCG/ACT inhaler INHALE 2 PUFFS TWICE A DAY 12 each 0   Ginger, Zingiber officinalis, (GINGER PO) Take by mouth.     nystatin (MYCOSTATIN) 100000 UNIT/ML suspension Take 5 mLs (500,000 Units total) by mouth 4 (four) times daily. 60 mL 0   pantoprazole (PROTONIX) 40 MG tablet Take 1 tablet (40 mg total) by mouth 2 (two) times daily before a meal. 60 tablet 3   TURMERIC PO Take by mouth.     valACYclovir (  VALTREX) 500 MG tablet Take 1 tablet (500 mg total) by mouth daily. 30 tablet 5   Current Facility-Administered Medications on File Prior to Visit  Medication Dose Route Frequency Provider Last Rate Last Admin   0.9 %  sodium chloride infusion  500 mL Intravenous Once Iva Boop, MD        Review of Systems  Constitutional:  Negative for fever.  HENT:  Negative for congestion, ear pain, sinus pain and sore throat.   Respiratory:  Positive for cough (minimal - hacky cough on occ, no sputum), shortness of breath and wheezing.   Cardiovascular:  Positive for chest pain (tightness) and palpitations (common). Negative for leg swelling.  Gastrointestinal:  Positive for abdominal pain.  Genitourinary:  Negative for dysuria.  Neurological:  Positive for light-headedness (x 10 days,  intermittent - when stands up) and headaches.       Objective:   Vitals:   11/28/22 1604  BP: 132/80  Pulse: (!) 134  Temp: 100 F (37.8 C)  SpO2: 97%   BP Readings from Last 3 Encounters:  11/28/22 132/80  10/19/22 108/70  09/19/22 124/78   Wt Readings from Last 3 Encounters:  11/28/22 168 lb (76.2 kg)  10/19/22 176 lb 2 oz (79.9 kg)  09/19/22 177 lb 8 oz (80.5 kg)   Body mass index is 27.12 kg/m.    Physical Exam Constitutional:      General: She is not in acute distress.    Appearance: Normal appearance. She is not ill-appearing.  HENT:     Head: Normocephalic and atraumatic.     Right Ear: Tympanic membrane, ear canal and external ear normal.     Left Ear: Tympanic membrane, ear canal and external ear normal.     Mouth/Throat:     Mouth: Mucous membranes are moist.     Pharynx: No oropharyngeal exudate or posterior oropharyngeal erythema.  Eyes:     Conjunctiva/sclera: Conjunctivae normal.  Cardiovascular:     Rate and Rhythm: Regular rhythm. Tachycardia present.  Pulmonary:     Effort: Pulmonary effort is normal. No respiratory distress.     Breath sounds: Normal breath sounds. No wheezing or rales.  Musculoskeletal:     Cervical back: Neck supple. No tenderness.  Lymphadenopathy:     Cervical: No cervical adenopathy.  Skin:    General: Skin is warm and dry.  Neurological:     Mental Status: She is alert.  Psychiatric:     Comments: Anxious appearing            Assessment & Plan:    Asthma exacerbation, mild-persistent: Acute Few days of SOB, wheeze, mild occ hacky cough Has overused albuterol today which is why she is tachycardic and more anxious than usual Lungs sound clear on exam but that is after 12 puffs of albuterol in the last hour No obvious URI, ? Allergies as possible cause Start prednisone - 20 mg tonight, then 40 mg daily - take with food - discussed possible side effects No albuterol today - can start tomorrow - 2 puss Q 6  hrs prn  Can start advair after finishing prednisone Call or return if no improvement  Tachycardia - regular rhythm on exam -  HR chronically high normal - medication adjusted recently Overuse of albuterol is likely the cause of her sinus tachycardia No change in medications - will hold albuterol for now  Migraine: Migraines last night and today - it is better today, but still present Deferred treatment -  usually treats it naturally Prednisone  will likely help  Anxiety: Acute on chronic Worse likely with overuse of albuterol Seeing a therapist Not on medication Deferred medication  Hypertension: Chronic BP controlled Continue amlodipine 5 mg daily

## 2022-11-28 NOTE — Patient Instructions (Addendum)
      Medications changes include :   start prednisone 40 mg daily for 5 days with food.  Take first dose today with food ( take one pill only - 20 mg).  If the dose is too high - decrease to 1 pill ( 20 mg daily) daily with food x 7 days.    Restart pantoprazole twice daily      Return if symptoms worsen or fail to improve.

## 2022-12-20 ENCOUNTER — Encounter: Payer: Self-pay | Admitting: Internal Medicine

## 2022-12-20 ENCOUNTER — Ambulatory Visit: Payer: BC Managed Care – PPO | Admitting: Internal Medicine

## 2022-12-20 VITALS — BP 122/80 | HR 58 | Ht 66.0 in | Wt 170.0 lb

## 2022-12-20 DIAGNOSIS — K649 Unspecified hemorrhoids: Secondary | ICD-10-CM | POA: Diagnosis not present

## 2022-12-20 DIAGNOSIS — R1310 Dysphagia, unspecified: Secondary | ICD-10-CM | POA: Diagnosis not present

## 2022-12-20 DIAGNOSIS — R140 Abdominal distension (gaseous): Secondary | ICD-10-CM | POA: Diagnosis not present

## 2022-12-20 DIAGNOSIS — R159 Full incontinence of feces: Secondary | ICD-10-CM

## 2022-12-20 MED ORDER — HYDROCORTISONE ACETATE 25 MG RE SUPP: 25.0000 mg | Freq: Every day | RECTAL | 0 refills | Status: DC

## 2022-12-20 NOTE — Progress Notes (Signed)
Samantha Delacruz 57 y.o. 1966/05/15 [id number]  Assessment & Plan:   Encounter Diagnoses  Name Primary?   Dysphagia, unspecified type Yes   Bloating    Incontinence of feces, unspecified fecal incontinence type    Hemorrhoids, unspecified hemorrhoid type    Evaluate upper symptoms of possible dysphagia with EGD.  Monitor bloating.  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  She has some mucus discharge incontinence type situation I think she has had this in the past.  This could be hemorrhoidal versus an IBS phenomenon or both.  I am going to treat this with hydrocortisone suppositories and reassess when she comes back for EGD.  I have asked her to take a tablespoon of Benefiber daily also.  The risks and benefits as well as alternatives of endoscopic procedure(s) have been discussed and reviewed. All questions answered. The patient agrees to proceed.   Subjective:   Chief Complaint: Bloating and dysphagia and fecal discharge  HPI 57 year old white woman with a history of IBS-like symptoms and hemorrhoids status post banding with latex free band in 2015, who has a several month history of bloating problems, and also some dysphagia type symptoms.  She feels like her throat closes up then it may be a little hard to swallow with drinking and hard to swallow pills for sure.  There is no clear overt food dysphagia.  She thinks she may be having some heartburn.  She saw Dr. [name] for this and was quite tender on exam when she was examined at that time and has been having some intermittent bloating.  She has tried some Advil.  She had COVID for the third time in December and feels like she has not completely recovered to baseline.  In general bowel habits are normal and formed.  However to times a week or so she will have a mucus discharge post defecation and if she does not wear a pad it will soil things.  She is not having bleeding.  She had similar problems prior to hemorrhoidal  banding in 2015.  She has urinary frequency and stress urinary incontinence and nocturia as well.  Gynecology visit last year she has no known history of cystocele or other problems like that.  An ultrasound of the abdomen for bloating and abdominal tenderness was negative.  She has been changed from omeprazole to Protonix 40 mg twice daily at the March visit and she thinks it might be helpful.  She is not really sure.  It is somewhat difficult to get a clear-cut history.  On April 15 she was seen as a work in by Dr. Lawerance Bach and had prednisone treatment for asthma.  She gets intermittent white coating on tongue particularly in the mornings.  Nystatin swish and swallow was prescribed for that in March.  She does not have any odynophagia.  She also notes that she had determined she was allergic to [employer] toothpaste.  She has mouth ulcers from that.   Lab Results  Component Value Date   WBC 8.9 09/19/2022   HGB 14.1 09/19/2022   HCT 41.1 09/19/2022   MCV 87.7 09/19/2022   PLT 412.0 (H) 09/19/2022    Colonoscopy 12/14/20 Impression:        - External hemorrhoids. Cause of prior bleeding.                           - Diverticulosis in the sigmoid colon. Few.                           -  The examination was otherwise normal on direct                            and retroflexion views.                           - No specimens collected.    Allergies  Allergen Reactions   Latex Rash   Green Dyes Hives    Green, yellow, blue   Influenza Virus Vaccine Nausea And Vomiting   Blue Dyes (Parenteral) Hives   Codeine Hives   Lactose Diarrhea   Lactose Intolerance (Gi) Diarrhea    cramping   Meloxicam Other (See Comments)   Sulfa Antibiotics Hives   Current Meds  Medication Sig   amLODipine (NORVASC) 5 MG tablet Take 1 tablet (5 mg total) by mouth daily.   Ginger, Zingiber officinalis, (GINGER PO) Take by mouth.   hydrocortisone (ANUSOL-HC) 25 MG suppository Place 1 suppository (25 mg total)  rectally at bedtime. For 7 nights then as needed   nystatin (MYCOSTATIN) 100000 UNIT/ML suspension Take 5 mLs (500,000 Units total) by mouth 4 (four) times daily.   pantoprazole (PROTONIX) 40 MG tablet Take 1 tablet (40 mg total) by mouth 2 (two) times daily before a meal.   valACYclovir (VALTREX) 500 MG tablet Take 1 tablet (500 mg total) by mouth daily.   Past Medical History:  Diagnosis Date   Adult ADHD    Anemia    history of   Angio-edema    Asthma    Brain injury (HCC)    Headache    Migraines   Heart palpitations    Hemorrhoids, internal, with bleeding and mucous discharge - Grade 1 02/05/2014   Herpes    Hypertension    Migraines    Pneumonia    PTSD (post-traumatic stress disorder)    has a stalker   Restless leg    Rosacea    Shingles    Urticaria    Vertigo    Past Surgical History:  Procedure Laterality Date   CESAREAN SECTION  08/15/1992   son   COLONOSCOPY     DILATATION & CURETTAGE/HYSTEROSCOPY WITH MYOSURE N/A 04/19/2016   Procedure: DILATATION & CURETTAGE/HYSTEROSCOPY WITH MYOSURE;  Surgeon: Dara Lords, MD;  Location: [facility];  Service: Gynecology;  Laterality: N/A;  request 7:30am start time   Requests one hour OR time.    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   Review of Systems As above  Objective:   Physical Exam @BP  122/80   Pulse (!) 58   Ht 5\' 6"  (1.676 m)    Wt 170 lb (77.1 kg)   LMP 05/15/2017   BMI 27.44 kg/m @  General:  NAD Eyes:   Anicteric  Mouth and pharynx are clear without evidence of thrush or lesions Lungs:  clear Heart::  S1S2 no rubs, murmurs or gallops Abdomen:  soft and nontender, BS+ Ext:   no edema, cyanosis or clubbing  Rectal  Shell CMA present  NL anoderm, DRE - Nl resting tone w/ formed brown stool. No mass or rectocele and nontender  Anoscopy Gr I mildly inflamed int/ext hemorrhoids 9violaceous)    Data Reviewed:  See above

## 2022-12-20 NOTE — Patient Instructions (Addendum)
You have been scheduled for an endoscopy. Please follow written instructions given to you at your visit today. If you use inhalers (even only as needed), please bring them with you on the day of your procedure.   We have sent the following medications to your pharmacy for you to pick up at your convenience: Anusol supp.   We have provided you with a handout for benefiber.  I appreciate the opportunity to care for you. Stan Head, MD, [facility]

## 2023-01-02 ENCOUNTER — Encounter: Payer: Self-pay | Admitting: Gastroenterology

## 2023-01-02 ENCOUNTER — Ambulatory Visit (AMBULATORY_SURGERY_CENTER): Payer: BC Managed Care – PPO | Admitting: Gastroenterology

## 2023-01-02 VITALS — BP 108/68 | HR 81 | Temp 98.0°F | Resp 21 | Ht 66.0 in | Wt 170.0 lb

## 2023-01-02 DIAGNOSIS — K319 Disease of stomach and duodenum, unspecified: Secondary | ICD-10-CM

## 2023-01-02 DIAGNOSIS — R09A2 Foreign body sensation, throat: Secondary | ICD-10-CM

## 2023-01-02 DIAGNOSIS — K2950 Unspecified chronic gastritis without bleeding: Secondary | ICD-10-CM | POA: Diagnosis not present

## 2023-01-02 DIAGNOSIS — R1310 Dysphagia, unspecified: Secondary | ICD-10-CM

## 2023-01-02 MED ORDER — SODIUM CHLORIDE 0.9 % IV SOLN: 500.0000 mL | Freq: Once | INTRAVENOUS | Status: DC

## 2023-01-02 NOTE — Progress Notes (Signed)
VS completed by DT.  Pt's states no medical or surgical changes since previsit or office visit.  

## 2023-01-02 NOTE — Progress Notes (Signed)
See 12/20/2022 H&P, no changes

## 2023-01-02 NOTE — Patient Instructions (Signed)
-   Resume previous diet. - Continue present medications. - Await pathology results.  YOU HAD AN ENDOSCOPIC PROCEDURE TODAY AT THE [facility]:   Refer to the procedure report that was given to you for any specific questions about what was found during the examination.  If the procedure report does not answer your questions, please call your gastroenterologist to clarify.  If you requested that your care partner not be given the details of your procedure findings, then the procedure report has been included in a sealed envelope for you to review at your convenience later.  YOU SHOULD EXPECT: Some feelings of bloating in the abdomen. Passage of more gas than usual.  Walking can help get rid of the air that was put into your GI tract during the procedure and reduce the bloating. If you had a lower endoscopy (such as a colonoscopy or flexible sigmoidoscopy) you may notice spotting of blood in your stool or on the toilet paper. If you underwent a bowel prep for your procedure, you may not have a normal bowel movement for a few days.  Please Note:  You might notice some irritation and congestion in your nose or some drainage.  This is from the oxygen used during your procedure.  There is no need for concern and it should clear up in a day or so.  SYMPTOMS TO REPORT IMMEDIATELY:  Following upper endoscopy (EGD)  Vomiting of blood or coffee ground material  New chest pain or pain under the shoulder blades  Painful or persistently difficult swallowing  New shortness of breath  Fever of 100F or higher  Black, tarry-looking stools  For urgent or emergent issues, a gastroenterologist can be reached at any hour by calling [phone]. Do not use MyChart messaging for urgent concerns.    DIET:  We do recommend a small meal at first, but then you may proceed to your regular diet.  Drink plenty of fluids but you should avoid alcoholic beverages for 24 hours.  ACTIVITY:  You should plan to  take it easy for the rest of today and you should NOT DRIVE or use heavy machinery until tomorrow (because of the sedation medicines used during the test).    FOLLOW UP: Our staff will call the number listed on your records the next business day following your procedure.  We will call around 7:15- 8:00 am to check on you and address any questions or concerns that you may have regarding the information given to you following your procedure. If we do not reach you, we will leave a message.     If any biopsies were taken you will be contacted by phone or by letter within the next 1-3 weeks.  Please call us at [phone] if you have not heard about the biopsies in 3 weeks.    SIGNATURES/CONFIDENTIALITY: You and/or your care partner have signed paperwork which will be entered into your electronic medical record.  These signatures attest to the fact that that the information above on your After Visit Summary has been reviewed and is understood.  Full responsibility of the confidentiality of this discharge information lies with you and/or your care-partner. 

## 2023-01-02 NOTE — Progress Notes (Signed)
To pacu, VSS. Report to [email or web address].tb 

## 2023-01-02 NOTE — Op Note (Signed)
[facility] Patient Name: Samantha Delacruz Procedure Date: 01/02/2023 3:30 PM MRN: [id number] Endoscopist: Meryl Dare , MD, [phone] Age: 57 Referring MD:  Date of Birth: 08-14-1966 Gender: Female Account #: [id number] Procedure:                Upper GI endoscopy Indications:              Dysphagia, Globus sensation Medicines:                Monitored Anesthesia Care Procedure:                Pre-Anesthesia Assessment:                           - Prior to the procedure, a History and Physical                            was performed, and patient medications and                            allergies were reviewed. The patient's tolerance of                            previous anesthesia was also reviewed. The risks                            and benefits of the procedure and the sedation                            options and risks were discussed with the patient.                            All questions were answered, and informed consent                            was obtained. Prior Anticoagulants: The patient has                            taken no anticoagulant or antiplatelet agents. ASA                            Grade Assessment: II - A patient with mild systemic                            disease. After reviewing the risks and benefits,                            the patient was deemed in satisfactory condition to                            undergo the procedure.                           After obtaining informed consent, the endoscope was  passed under direct vision. Throughout the                            procedure, the patient's blood pressure, pulse, and                            oxygen saturations were monitored continuously. The                            GIF [id number] #[id number] was introduced through the                            mouth, and advanced to the second part of duodenum.                            The upper GI  endoscopy was accomplished without                            difficulty. The patient tolerated the procedure                            well. Scope In: Scope Out: Findings:                 No endoscopic abnormality was evident in the                            esophagus to explain the patient's complaint of                            dysphagia. It was decided, however, to proceed with                            dilation of the entire esophagus. A guidewire was                            placed and the scope was withdrawn. Dilation was                            performed with a Savary dilator with no resistance                            at 17 mm.                           Patchy mildly erythematous mucosa without bleeding                            was found in the gastric body.                           The exam of the stomach was otherwise normal.                           The duodenal bulb and  second portion of the                            duodenum were normal. Complications:            No immediate complications. Estimated Blood Loss:     Estimated blood loss was minimal. Impression:               - No endoscopic esophageal abnormality to explain                            patient's dysphagia. Esophagus dilated.                           - Erythematous mucosa in the gastric body. Biopsied.                           - Normal duodenal bulb and second portion of the                            duodenum. Recommendation:           - Patient has a contact number available for                            emergencies. The signs and symptoms of potential                            delayed complications were discussed with the                            patient. Return to normal activities tomorrow.                            Written discharge instructions were provided to the                            patient.                           - Clear liquid diet for 2 hours, then advance as                             tolerated to soft diet today.                           - Resume prior diet tomorrow.                           - Continue present medications.                           - Await pathology results.                           - Return to GI office with Dr. Leone Payor for ongoing  care. Meryl Dare, MD 01/02/2023 4:02:48 PM This report has been signed electronically.

## 2023-01-02 NOTE — Progress Notes (Signed)
Called to room to assist during endoscopic procedure.  Patient ID and intended procedure confirmed with present staff. Received instructions for my participation in the procedure from the performing physician.  

## 2023-01-03 ENCOUNTER — Telehealth: Payer: Self-pay

## 2023-01-03 NOTE — Telephone Encounter (Signed)
  Follow up Call-     01/02/2023    3:13 PM 12/14/2020    2:28 PM  Call back number  Post procedure Call Back phone  # [phone]  Permission to leave phone message Yes Yes  comments  no answering machine     Patient questions:  Do you have a fever, pain , or abdominal swelling? No. Pain Score  0 *  Have you tolerated food without any problems? Yes.    Have you been able to return to your normal activities? Yes.    Do you have any questions about your discharge instructions: Diet   No. Medications  No. Follow up visit  No.  Do you have questions or concerns about your Care? No.  Actions: * If pain score is 4 or above: No action needed, pain <4.

## 2023-01-12 ENCOUNTER — Other Ambulatory Visit: Payer: Self-pay | Admitting: Family Medicine

## 2023-01-12 ENCOUNTER — Other Ambulatory Visit: Payer: Self-pay | Admitting: Internal Medicine

## 2023-01-12 DIAGNOSIS — I10 Essential (primary) hypertension: Secondary | ICD-10-CM

## 2023-01-18 ENCOUNTER — Encounter: Payer: Self-pay | Admitting: Gastroenterology

## 2023-01-24 ENCOUNTER — Encounter: Payer: Self-pay | Admitting: Family Medicine

## 2023-01-24 ENCOUNTER — Ambulatory Visit: Payer: BC Managed Care – PPO | Admitting: Family Medicine

## 2023-01-24 VITALS — BP 120/70 | HR 77 | Temp 98.0°F | Resp 16 | Ht 66.0 in | Wt 170.2 lb

## 2023-01-24 DIAGNOSIS — M25551 Pain in right hip: Secondary | ICD-10-CM

## 2023-01-24 DIAGNOSIS — K219 Gastro-esophageal reflux disease without esophagitis: Secondary | ICD-10-CM

## 2023-01-24 DIAGNOSIS — R7303 Prediabetes: Secondary | ICD-10-CM

## 2023-01-24 DIAGNOSIS — I10 Essential (primary) hypertension: Secondary | ICD-10-CM | POA: Diagnosis not present

## 2023-01-24 DIAGNOSIS — R140 Abdominal distension (gaseous): Secondary | ICD-10-CM

## 2023-01-24 DIAGNOSIS — A609 Anogenital herpesviral infection, unspecified: Secondary | ICD-10-CM

## 2023-01-24 DIAGNOSIS — B370 Candidal stomatitis: Secondary | ICD-10-CM

## 2023-01-24 DIAGNOSIS — I872 Venous insufficiency (chronic) (peripheral): Secondary | ICD-10-CM

## 2023-01-24 MED ORDER — PANTOPRAZOLE SODIUM 40 MG PO TBEC: 40.0000 mg | DELAYED_RELEASE_TABLET | Freq: Every day | ORAL | 0 refills | Status: DC

## 2023-01-24 MED ORDER — VALACYCLOVIR HCL 500 MG PO TABS: 500.0000 mg | ORAL_TABLET | Freq: Every day | ORAL | 3 refills | Status: DC

## 2023-01-24 NOTE — Patient Instructions (Addendum)
It was very nice to see you today!  Check blood pressures monthly and as needed  Urea cream, lac hydrin.cream for arms   Make appointment with Dr. Leone Payor about the bloating  Call Dr. Alvester Morin about hip.    PLEASE NOTE:  If you had any lab tests please let us know if you have not heard back within a few days. You may see your results on MyChart before we have a chance to review them but we will give you a call once they are reviewed by [place]. If we ordered any referrals today, please let us know if you have not heard from their office within the next week.   Please try these tips to maintain a healthy lifestyle:  Eat most of your calories during the day when you are active. Eliminate processed foods including packaged sweets (pies, cakes, cookies), reduce intake of potatoes, white bread, white pasta, and white rice. Look for whole grain options, oat flour or almond flour.  Each meal should contain half fruits/vegetables, one quarter protein, and one quarter carbs (no bigger than a computer mouse).  Cut down on sweet beverages. This includes juice, soda, and sweet tea. Also watch fruit intake, though this is a healthier sweet option, it still contains natural sugar! Limit to 3 servings daily.  Drink at least 1 glass of water with each meal and aim for at least 8 glasses per day  Exercise at least 150 minutes every week.

## 2023-01-24 NOTE — Progress Notes (Signed)
Subjective:     Patient ID: Samantha Delacruz, female    DOB: 1965-09-28, 57 y.o.   MRN: [id number]  Chief Complaint  Patient presents with   Medical Management of Chronic Issues    3 month follow-up on HTN, GERD Still having bloating and white tongue Right leg pain Issues with herpes and skin     HPI  HTN-Pt is on amlodipine 5mg .  Bp's running not checking.  No ha/dizziness/cp/palp/edema/cough/shortness of breath.  Working on diet a lot  Bloating-quit sodas, gluten and still bloating.   Wants to see nutritionist.  [place] Asters tongue-still despite diet and medications in past.  Filled nystatin but didn't take then lost it.  GERD-taking protonix 40 mg daily-doing better.  Had EGD w/dilitation Right leg pain-saw vein clinic-valves "failing"..  will get procedure soon.   Also has pain in right hip and thigh-injections in past w/orthopedic.  Herpes-had break out recently a lot of stress.  Takes valtrex daily. preDM-working some on diet-not quite sure what all is ok.  Hard to exercise due to leg pain  There are no preventive care reminders to display for this patient.  Past Medical History:  Diagnosis Date   Adult ADHD    Anemia    history of   Angio-edema    Asthma    Brain injury (HCC)    Headache    Migraines   Heart palpitations    Hemorrhoids, internal, with bleeding and mucous discharge - Grade 1 02/05/2014   Herpes    Hypertension    Migraines    Pneumonia    PTSD (post-traumatic stress disorder)    has a stalker   Restless leg    Rosacea    Shingles    Urticaria    Vertigo     Past Surgical History:  Procedure Laterality Date   CESAREAN SECTION  08/15/1992   son   COLONOSCOPY     DILATATION & CURETTAGE/HYSTEROSCOPY WITH MYOSURE N/A 04/19/2016   Procedure: DILATATION & CURETTAGE/HYSTEROSCOPY WITH MYOSURE;  Surgeon: Dara Lords, MD;  Location: [facility];  Service: Gynecology;  Laterality: N/A;  request 7:30am start time   Requests one hour OR time.     HEMORRHOID BANDING     retinal peel left eye surgery Left    retina and cataract   thrush       Current Outpatient Medications:    amLODipine (NORVASC) 5 MG tablet, Take 1 tablet (5 mg total) by mouth daily., Disp: 90 tablet, Rfl: 1   Chlorpheniramine Maleate (ALLERGY PO), Take by mouth., Disp: , Rfl:    EPINEPHrine (AUVI-Q) 0.3 mg/0.3 mL IJ SOAJ injection, Inject 0.3 mg into the muscle as needed for anaphylaxis., Disp: 1 each, Rfl: 1   Lysine 500 MG CAPS, Take by mouth., Disp: , Rfl:    TURMERIC PO, Take by mouth., Disp: , Rfl:    albuterol (PROAIR HFA) 108 (90 Base) MCG/ACT inhaler, INHALE 2 PUFFS EVERY 4 HOURS AS NEEDED FOR WHEEZING OR SHORTNESS OF BREATH (Patient not taking: Reported on 12/20/2022), Disp: 8.5 each, Rfl: 2   fluticasone (FLONASE) 50 MCG/ACT nasal spray, Place 1 spray into both nostrils daily. (Patient not taking: Reported on 12/20/2022), Disp: 16 g, Rfl: 0   fluticasone-salmeterol (ADVAIR HFA) 230-21 MCG/ACT inhaler, INHALE 2 PUFFS TWICE A DAY (Patient not taking: Reported on 12/20/2022), Disp: 12 each, Rfl: 0   Ginger, Zingiber officinalis, (GINGER PO), Take by mouth. (Patient not taking: Reported on 01/24/2023), Disp: , Rfl:    hydrocortisone (  ANUSOL-HC) 25 MG suppository, Place 1 suppository (25 mg total) rectally at bedtime. For 7 nights then as needed (Patient not taking: Reported on 01/24/2023), Disp: 12 suppository, Rfl: 0   nystatin (MYCOSTATIN) 100000 UNIT/ML suspension, Take 5 mLs (500,000 Units total) by mouth 4 (four) times daily. (Patient not taking: Reported on 01/24/2023), Disp: 60 mL, Rfl: 0   pantoprazole (PROTONIX) 40 MG tablet, Take 1 tablet (40 mg total) by mouth daily., Disp: 90 tablet, Rfl: 0   valACYclovir (VALTREX) 500 MG tablet, Take 1 tablet (500 mg total) by mouth daily., Disp: 90 tablet, Rfl: 3  Allergies  Allergen Reactions   Latex Rash   Green Dyes Hives    Green, yellow, blue   Influenza Virus Vaccine Nausea And Vomiting   Blue Dyes (Parenteral)  Hives   Codeine Hives   Lactose Diarrhea   Lactose Intolerance (Gi) Diarrhea    cramping   Meloxicam Other (See Comments)   Sulfa Antibiotics Hives   ROS neg/noncontributory except as noted HPI/below      Objective:     BP 120/70   Pulse 77   Temp 98 F (36.7 C) (Temporal)   Resp 16   Ht 5\' 6"  (1.676 m)   Wt 170 lb 4 oz (77.2 kg)   LMP 05/15/2017   SpO2 98%   BMI 27.48 kg/m  Wt Readings from Last 3 Encounters:  01/24/23 170 lb 4 oz (77.2 kg)  01/02/23 170 lb (77.1 kg)  12/20/22 170 lb (77.1 kg)    Physical Exam   Gen: WDWN NAD HEENT: NCAT, conjunctiva not injected, sclera nonicteric NECK:  supple, no thyromegaly, no nodes, no carotid bruits CARDIAC: RRR, S1S2+, no murmur. DP 2+B LUNGS: CTAB. No wheezes ABDOMEN:  BS+, soft, NTND, No HSM, no masses EXT:  no edema MSK: no gross abnormalities.  NEURO: A&O x3.  CN II-XII intact.  PSYCH: normal mood. Good eye contact     Assessment & Plan:  Primary hypertension  Gastroesophageal reflux disease without esophagitis  Prediabetes -     Referral to Nutrition and Diabetes Services  Bloating -     Referral to Nutrition and Diabetes Services  Thrush -     Referral to Nutrition and Diabetes Services  HSV (herpes simplex virus) anogenital infection -     valACYclovir HCl; Take 1 tablet (500 mg total) by mouth daily.  Dispense: 90 tablet; Refill: 3  Right hip pain  Venous insufficiency of right leg  Other orders -     Pantoprazole Sodium; Take 1 tablet (40 mg total) by mouth daily.  Dispense: 90 tablet; Refill: 0   HTN-chronic.  Controlled on amlodipine 5 mg.  Continue PreDM-chronic.  Refer to nutritionist Bloating-had EGD-advised follow up gastroenterology.  Refer nutritionist GERD-chronic.  Controlled w/Protonix 40 mg daily-continue 1-2 months more to let everything heal ?thrush-tongue only.  Will see nutritionist to help w/sugars.  Follow up gastroenterology HSV-chronic.  Was controlled on valtrex 500 mg  daily, but addn'l stress caused flare.  Resolving.   Right hip/thigh pain-advised to follow up orthopedic Venous insuff right leg-saw vein surgeon.  Needs to wear compression stockings.  Will get procedure in future   Return in about 3 months (around 04/26/2023) for chronic follow-up.  Angelena Sole, MD

## 2023-01-25 ENCOUNTER — Telehealth: Payer: Self-pay | Admitting: Internal Medicine

## 2023-01-25 ENCOUNTER — Telehealth: Payer: Self-pay | Admitting: Physical Medicine and Rehabilitation

## 2023-01-25 DIAGNOSIS — M47818 Spondylosis without myelopathy or radiculopathy, sacral and sacrococcygeal region: Secondary | ICD-10-CM

## 2023-01-25 NOTE — Telephone Encounter (Signed)
Patient called. Would like an injection by [facility]. Her cb# [phone]

## 2023-01-25 NOTE — Telephone Encounter (Signed)
Patient called requesting Lab results from 5/20. Please advise.

## 2023-01-26 NOTE — Telephone Encounter (Signed)
I can see her at 350 on July 11

## 2023-01-26 NOTE — Telephone Encounter (Signed)
Attempted to reach patient. No answer and no voicemail, eventually leading to a "busy signal." Will attempt to reach out again at a later time.

## 2023-01-26 NOTE — Telephone Encounter (Signed)
Spoke with patient regarding letter containing path results from 01/02/23 & she still has concerns about her bloating. Denies pain. PCP is referring her to a nutritionist. She is trying to avoid gluten currently. Takes pantoprazole QD. PCP is recommending BID, but patient would like Dr. Marvell Fuller opinion. She's concerned she may have to take it long term. Also, has questions in regards to fiber supplement. Advised her to take benefiber as recommended per last OV note, but patient is hesitant to take any additional supplements/medications. She's questioning if follow up is needed.

## 2023-01-26 NOTE — Telephone Encounter (Signed)
Scheduled appointment with patient

## 2023-01-27 NOTE — Telephone Encounter (Signed)
Repeat right sacroiliac joint injection order placed

## 2023-01-27 NOTE — Addendum Note (Signed)
Addended by: Sharlet Salina on: 01/27/2023 11:12 AM   Modules accepted: Orders

## 2023-01-27 NOTE — Telephone Encounter (Signed)
Spoke with patient and she is requesting a SI injection. Last injection 03/2022 lasted 3 full months with 100% relief before the pain started to come back. No new falls, accidents or injuries. Please advise

## 2023-02-06 ENCOUNTER — Ambulatory Visit (INDEPENDENT_AMBULATORY_CARE_PROVIDER_SITE_OTHER): Payer: BC Managed Care – PPO | Admitting: Physical Medicine and Rehabilitation

## 2023-02-06 ENCOUNTER — Other Ambulatory Visit: Payer: Self-pay

## 2023-02-06 DIAGNOSIS — M461 Sacroiliitis, not elsewhere classified: Secondary | ICD-10-CM | POA: Diagnosis not present

## 2023-02-06 MED ORDER — METHYLPREDNISOLONE ACETATE 40 MG/ML IJ SUSP
40.0000 mg | INTRAMUSCULAR | Status: AC | PRN
  Administered 2023-02-06: 40 mg via INTRA_ARTICULAR

## 2023-02-06 MED ORDER — METHYLPREDNISOLONE ACETATE 80 MG/ML IJ SUSP
80.0000 mg | INTRAMUSCULAR | Status: AC | PRN
Start: 2023-02-06 — End: 2023-02-06
  Administered 2023-02-06: 80 mg via INTRA_ARTICULAR

## 2023-02-06 MED ORDER — BUPIVACAINE HCL 0.5 % IJ SOLN
2.0000 mL | INTRAMUSCULAR | Status: AC | PRN
  Administered 2023-02-06: 2 mL via INTRA_ARTICULAR

## 2023-02-06 NOTE — Progress Notes (Signed)
Samantha Delacruz - 57 y.o. female MRN [id number]  Date of birth: September 13, 1965  Office Visit Note: Visit Date: 02/06/2023 PCP: Jeani Sow, MD Referred by: Jeani Sow, MD  Subjective: Chief Complaint  Patient presents with   Lower Back - Pain   HPI:  Samantha Delacruz is a 57 y.o. female who comes in today at the request of Dr. Huel Cote for planned repeat Right anesthetic Sacroiliac joint arthrogram with fluoroscopic guidance.  The patient has failed conservative care including home exercise, medications, time and activity modification.  This injection will be diagnostic and hopefully therapeutic.  Please see requesting physician notes for further details and justification.  She received excellent and almost instant relief with the last injection performed in August of last year.  She has had recent worsening of the same pain.  She is also seeing vascular surgery and a vein specialist and there is concern of some vascular claudication and her iliac vessels.  I discussed briefly with her depending on how she is doing with that to look at possible lumbar spine MRI as we do not really have any pictures of that.  Her symptoms are consistent with sacroiliac joint dysfunction.  Positive Fortin finger sign, Patrick's testing and lateral compression test.    ROS Otherwise per HPI.  Assessment & Plan: Visit Diagnoses:    ICD-10-CM   1. Sacroiliitis (HCC)  M46.1 Sacroiliac Joint Inj, Right    XR C-ARM NO REPORT      Plan: No additional findings.   Meds & Orders: No orders of the defined types were placed in this encounter.   Orders Placed This Encounter  Procedures   Sacroiliac Joint Inj, Right   XR C-ARM NO REPORT    Follow-up: Return for visit to requesting provider as needed.   Procedures: Sacroiliac Joint Inj, Right on 02/06/2023 9:51 AM Indications: pain and diagnostic evaluation Details: 22 G 3.5 in needle, fluoroscopy-guided posterior approach Medications: 2  mL bupivacaine 0.5 %; 80 mg methylPREDNISolone acetate 80 MG/ML; 40 mg methylPREDNISolone acetate 40 MG/ML Outcome: tolerated well, no immediate complications  Sacroiliac Joint Intra-Articular Injection - Posterior Approach with Fluoroscopic Guidance   Position: PRONE  Additional Comments: Vital signs were monitored before and after the procedure. Patient was prepped and draped in the usual sterile fashion. The correct patient, procedure, and site was verified.   Injection Procedure Details:   Location/Site:  Sacroiliac joint  Needle size: 3.5 in Spinal Needle  Needle type: Spinal  Needle Placement: Intra-articular  Findings:  -Comments: There was excellent flow of contrast producing a partial arthrogram of the sacroiliac joint.   Procedure Details: Starting with a 90 degree vertical and midline orientation the fluoroscope was tilted cranially 20 to 25 degrees and the target area of the inferior most part of the SI joint on the side mentioned above was visualized.  The soft tissues overlying this target were infiltrated with 4 ml. of 1% Lidocaine without Epinephrine. A #22 gauge spinal needle was inserted perpendicular to the fluoroscope table and advanced into the posterior inferior joint space using fluoroscopic guidance.  Position in the joint space was confirmed by obtaining a partial arthrogram using a 2 ml. volume of Isovue-250 contrast agent. After negative aspirate for gross pus or blood, the injectate was delivered to the joint. Radiographs were obtained for documentation purposes.   Additional Comments:   Dressing: Bandaid    Post-procedure details: Patient was observed during the procedure. Post-procedure instructions were reviewed.  Patient left  the clinic in stable condition.    There was excellent flow of contrast producing a partial arthrogram of the sacroiliac joint.  Procedure, treatment alternatives, risks and benefits explained, specific risks  discussed. Consent was given by the patient. Immediately prior to procedure a time out was called to verify the correct patient, procedure, equipment, support staff and site/side marked as required. Patient was prepped and draped in the usual sterile fashion.          Clinical History: No specialty comments available.     Objective:  VS:  HT:    WT:   BMI:     BP:   HR: bpm  TEMP: ( )  RESP:  Physical Exam   Imaging: No results found.

## 2023-02-06 NOTE — Progress Notes (Signed)
Functional Pain Scale - descriptive words and definitions  Moderate (4)   Constantly aware of pain, can complete ADLs with modification/sleep marginally affected at times/passive distraction is of no use, but active distraction gives some relief. Moderate range order  Average Pain 2-3   +Driver, -BT, -Dye Allergies.  Lower back pain on the right side that radiates into the right leg

## 2023-02-07 ENCOUNTER — Other Ambulatory Visit: Payer: Self-pay | Admitting: *Deleted

## 2023-02-07 MED ORDER — EPINEPHRINE 0.3 MG/0.3ML IJ SOAJ: 0.3000 mg | INTRAMUSCULAR | 1 refills | Status: DC | PRN

## 2023-02-23 ENCOUNTER — Ambulatory Visit: Payer: BC Managed Care – PPO | Admitting: Internal Medicine

## 2023-03-14 ENCOUNTER — Telehealth: Payer: Self-pay | Admitting: Family Medicine

## 2023-03-14 NOTE — Progress Notes (Signed)
Samantha Delacruz is a 57 y.o. female here for a new problem.  History of Present Illness:   No chief complaint on file.   HPI  Fall Injuries She sustained an injury to her back and right arm when she fell in the shower on 03/05/23. Unsure if she had any trama to her head.   Past Medical History:  Diagnosis Date   Adult ADHD    Anemia    history of   Angio-edema    Asthma    Brain injury (HCC)    Headache    Migraines   Heart palpitations    Hemorrhoids, internal, with bleeding and mucous discharge - Grade 1 02/05/2014   Herpes    Hypertension    Migraines    Pneumonia    PTSD (post-traumatic stress disorder)    has a stalker   Restless leg    Rosacea    Shingles    Urticaria    Vertigo      Social History   Tobacco Use   Smoking status: Never   Smokeless tobacco: Never  Vaping Use   Vaping status: Never Used  Substance Use Topics   Alcohol use: Not Currently    Alcohol/week: 0.0 standard drinks of alcohol   Drug use: No    Past Surgical History:  Procedure Laterality Date   CESAREAN SECTION  08/15/1992   son   COLONOSCOPY     DILATATION & CURETTAGE/HYSTEROSCOPY WITH MYOSURE N/A 04/19/2016   Procedure: DILATATION & CURETTAGE/HYSTEROSCOPY WITH MYOSURE;  Surgeon: Dara Lords, MD;  Location: [facility];  Service: Gynecology;  Laterality: N/A;  request 7:30am start time   Requests one hour OR time.    HEMORRHOID BANDING     retinal peel left eye surgery Left    retina and cataract   thrush      Family History  Problem Relation Age of Onset   Depression Mother    Diabetes Mother    Emphysema Mother    Stroke Mother    Cancer Mother        uterine    Heart disease Father    Heart failure Father    Thyroid disease Sister    Thyroid disease Brother    ADD / ADHD Son    Stroke Maternal Grandmother    Cancer Paternal Grandfather        esophgus    Colon cancer Neg Hx     Allergies  Allergen Reactions   Latex Rash   Green Dyes Hives     Green, yellow, blue   Influenza Virus Vaccine Nausea And Vomiting   Blue Dyes (Parenteral) Hives   Codeine Hives   Lactose Diarrhea   Lactose Intolerance (Gi) Diarrhea    cramping   Meloxicam Other (See Comments)   Sulfa Antibiotics Hives    Current Medications:   Current Outpatient Medications:    albuterol (PROAIR HFA) 108 (90 Base) MCG/ACT inhaler, INHALE 2 PUFFS EVERY 4 HOURS AS NEEDED FOR WHEEZING OR SHORTNESS OF BREATH (Patient not taking: Reported on 12/20/2022), Disp: 8.5 each, Rfl: 2   amLODipine (NORVASC) 5 MG tablet, Take 1 tablet (5 mg total) by mouth daily., Disp: 90 tablet, Rfl: 1   Chlorpheniramine Maleate (ALLERGY PO), Take by mouth., Disp: , Rfl:    EPINEPHrine (AUVI-Q) 0.3 mg/0.3 mL IJ SOAJ injection, Inject 0.3 mg into the muscle as needed for anaphylaxis., Disp: 1 each, Rfl: 1   fluticasone (FLONASE) 50 MCG/ACT nasal spray, Place 1 spray into  both nostrils daily. (Patient not taking: Reported on 12/20/2022), Disp: 16 g, Rfl: 0   fluticasone-salmeterol (ADVAIR HFA) 230-21 MCG/ACT inhaler, INHALE 2 PUFFS TWICE A DAY (Patient not taking: Reported on 12/20/2022), Disp: 12 each, Rfl: 0   Ginger, Zingiber officinalis, (GINGER PO), Take by mouth. (Patient not taking: Reported on 01/24/2023), Disp: , Rfl:    hydrocortisone (ANUSOL-HC) 25 MG suppository, Place 1 suppository (25 mg total) rectally at bedtime. For 7 nights then as needed (Patient not taking: Reported on 01/24/2023), Disp: 12 suppository, Rfl: 0   Lysine 500 MG CAPS, Take by mouth., Disp: , Rfl:    nystatin (MYCOSTATIN) 100000 UNIT/ML suspension, Take 5 mLs (500,000 Units total) by mouth 4 (four) times daily. (Patient not taking: Reported on 01/24/2023), Disp: 60 mL, Rfl: 0   pantoprazole (PROTONIX) 40 MG tablet, Take 1 tablet (40 mg total) by mouth daily., Disp: 90 tablet, Rfl: 0   TURMERIC PO, Take by mouth., Disp: , Rfl:    valACYclovir (VALTREX) 500 MG tablet, Take 1 tablet (500 mg total) by mouth daily., Disp: 90  tablet, Rfl: 3   Review of Systems:   ROS  Vitals:   There were no vitals filed for this visit.   There is no height or weight on file to calculate BMI.  Physical Exam:   Physical Exam  Assessment and Plan:   ***   I,Alexander Ruley,acting as a scribe for [name], PA.,have documented all relevant documentation on the behalf of [name], PA,as directed by  [name], PA while in the presence of [name], [place].   ***   [name], PA-C

## 2023-03-14 NOTE — Telephone Encounter (Signed)
FYI, see Triage note. 

## 2023-03-14 NOTE — Telephone Encounter (Signed)
Patient Advised: Go to ED Now  Patient refused-told Triage Samantha Delacruz) she would only be seen at [facility]. Patient is scheduled with Samantha Delacruz (ok'd OV) 03/15/23 at 10 am.    Patient Name First: Samantha Last: [facility] Gender: Female DOB: [birth year] Age: 57 Y 4 M 5 D Return Phone Number: [phone] (Primary) Address: City/ State/ Zip: [place]  [address] Client [facility] [employer] [facility] Provider Samantha Delacruz, Samantha Delacruz Contact Type Call Who Is Calling Patient / Member / Family / Caregiver Call Type Triage / Clinical Relationship To Patient Self Return Phone Number [phone] (Primary) Chief Complaint HEAD INJURY - and not acting right. Change in behaviour after hitting head. Reason for Call Symptomatic / Request for Health Information Initial Comment Samantha Delacruz with the office transferring caller: Patient fell in the shower on 07/21 injuring her back and right arm, she may have hit her head and cant remember. Translation No Nurse Assessment Nurse: Samantha Elizabeth, RN, Samantha Delacruz Date/Time (Eastern Time): 03/14/2023 9:32:07 AM Confirm and document reason for call. If symptomatic, describe symptoms. ---Caller states she fell in the shower about 9 days ago and may have hit her head. She can't remember exactly. She continues to have pain in her upper back/ neck (current pain rated as a 4 on the 1 to 10 scale) right upper arm (current pain rated as a 2 on the 1 to 10 scale). No cuts or scrapes. No fever. Alert and responsive. (No medical attention since the injury.) Does the patient have any new or worsening symptoms? ---Yes Will a triage be completed? ---Yes Related visit to physician within the last 2 weeks? ---No Does the PT have any chronic conditions? (i.e. diabetes, asthma, this includes High risk factors for pregnancy, etc.) ---Yes List chronic conditions. ---ADHD, Previous Brain Trauma, Herpes,  Migraines, Bladder problems Is this a behavioral health or substance abuse call? ---No Guidelines Guideline Title Affirmed Question Affirmed Notes Nurse Date/Time Samantha Delacruz Time) Head Injury Can't remember what happened (amnesia) [name], RN, [name] 03/14/2023 9:37:04 AM Disp. Time Samantha Delacruz Time) Disposition Final User 03/14/2023 9:30:41 AM Send to Urgent Samantha Delacruz, Samantha Delacruz 03/14/2023 9:39:14 AM Go to ED Now Yes Samantha Elizabeth, RN, Samantha Delacruz 03/14/2023 9:53:12 AM Call Completed Samantha Elizabeth, RN, Samantha Delacruz Final Disposition 03/14/2023 9:39:14 AM Go to ED Now Yes Samantha Elizabeth, RN, Samantha Delacruz Disagree/Comply Disagree Caller Understands Yes PreDisposition Call Doctor Care Advice Given Per Guideline GO TO ED NOW: * You need to be seen in the Emergency Department. * Go to the ED at ___________ Hospital. * Leave now. Drive carefully. ANOTHER ADULT SHOULD DRIVE: * It is better and safer if another adult drives instead of you. NOTHING BY MOUTH: * Do not allow any eating or drinking. * Also avoid pain medicines until seen. Reason: Condition may need surgery and general anesthesia. CARE ADVICE given per Head Injury (Adult) guideline.  Comments User: Samantha Bald, RN Date/Time Samantha Delacruz Time): 03/14/2023 9:52:58 AM Samantha Delacruz sternly declined the Go to ED disposition due to cost. Connected with Samantha Delacruz from the office for further direction from MD. Referrals GO TO FACILITY REFUSED

## 2023-03-14 NOTE — Telephone Encounter (Signed)
FYI: This call has been transferred to triage nurse: the Triage Nurse. Once the result note has been entered staff can address the message at that time.  Patient called in with the following symptoms:  Red Word:fall  03/05/23 fell in shower. Injured back and right arm. States she is not sure if she hit her head.    Please advise at Mobile [phone] (mobile)  Message is routed to Provider Pool.

## 2023-03-15 ENCOUNTER — Encounter: Payer: Self-pay | Admitting: Physician Assistant

## 2023-03-15 ENCOUNTER — Ambulatory Visit: Payer: BC Managed Care – PPO | Admitting: Physician Assistant

## 2023-03-15 VITALS — BP 108/70 | HR 87 | Temp 97.3°F | Ht 66.0 in | Wt 173.2 lb

## 2023-03-15 DIAGNOSIS — M542 Cervicalgia: Secondary | ICD-10-CM

## 2023-03-15 DIAGNOSIS — W19XXXA Unspecified fall, initial encounter: Secondary | ICD-10-CM | POA: Diagnosis not present

## 2023-03-15 NOTE — Patient Instructions (Signed)
It was great to see you!  Consider physical therapy for your neck -- message [place] if concerns  If any new/worsening symptom(s), reach out  If severe headache(s) or vision changes -- go to the ER  Take care,  [name] PA-C

## 2023-03-20 ENCOUNTER — Ambulatory Visit: Payer: BC Managed Care – PPO | Admitting: Registered"

## 2023-04-12 ENCOUNTER — Encounter: Payer: Self-pay | Admitting: Skilled Nursing Facility1

## 2023-04-12 ENCOUNTER — Encounter: Payer: BC Managed Care – PPO | Attending: Family Medicine | Admitting: Skilled Nursing Facility1

## 2023-04-12 VITALS — Ht 65.0 in | Wt 174.1 lb

## 2023-04-12 DIAGNOSIS — R140 Abdominal distension (gaseous): Secondary | ICD-10-CM | POA: Insufficient documentation

## 2023-04-12 NOTE — Progress Notes (Unsigned)
Medical Nutrition Therapy  Appointment Start time:  3:10  Appointment End time:  4:10  Primary concerns today: continued thrush  Referral diagnosis: r14  NUTRITION ASSESSMENT    Clinical Medical Hx: anemia, asthma, thrush Medications: see list Labs:  Notable Signs/Symptoms: no longer since bladder infection has cleared up  Lifestyle & Dietary Hx  Pt states after having covid a few times she has had a messed up gut. Pt states she had extreme bloating so went on a gluten free diet which she feels to have helped. Pt states she wants to work on her thrush and herpes.  Pt states she takes an acid reducer daily stating it has helped.  Pt states she has been stuggling with thursh for many years   Pt Checked her blood sugar in office with a number of 100 having eaten about 2.5 hours ago.   Pt states she believes she has a green die allergy.  Pt states she stays away from gluten.    Body Composition Scale 04/12/2023  Current Body Weight 174.1  Total Body Fat % 35.9  Visceral Fat 9  Fat-Free Mass % 64   Total Body Water % 46.5  Muscle-Mass lbs 30.1  BMI 28.6  Body Fat Displacement          Torso  lbs 38.7         Left Leg  lbs 7.7         Right Leg  lbs 7.7         Left Arm  lbs 3.8         Right Arm   lbs 3.8    Estimated daily fluid intake: 94+ oz Supplements:  Sleep:  Stress / self-care:  Current average weekly physical activity:   24-Hr Dietary Recall First Meal: fruit, granola in greek yogurt and steel cut oats + lactose milk and nuts Snack:  Second Meal: salad wit dressing and cranberries and  Snack:  Third Meal: chips + salsa + sour cream or grilled cheese or peach Snack:  Beverages: mushroom coffee, 94 ounces water   NUTRITION INTERVENTION  Nutrition education (E-1) on the following topics:  Creation of balanced and diverse meals to increase the intake of nutrient-rich foods that provide essential vitamins, minerals, fiber, and phytonutrients  Variety of  Fruits and Vegetables:  Aim for a colorful array of fruits and vegetables to ensure a wide range of nutrients. Include a mix of leafy greens, berries, citrus fruits, cruciferous vegetables, and more. Whole Grains: Choose whole grains over refined grains. Examples include brown rice, quinoa, oats, whole wheat, and barley. Lean Proteins: Include lean sources of protein, such as poultry, fish, tofu, legumes, beans, lentils, and low-fat dairy products. Limit red and processed meats. Healthy Fats: Incorporate sources of healthy fats, including avocados, nuts, seeds, and olive oil. Limit saturated and trans fats found in fried and processed foods. Dairy or Dairy Alternatives: Choose low-fat or fat-free dairy products, or plant-based alternatives like almond or soy milk. Portion Control: Be mindful of portion sizes to avoid overeating. Pay attention to hunger and satisfaction cues. Limit Added Sugars: Minimize the consumption of sugary beverages, snacks, and desserts. Check food labels for added sugars and opt for natural sources of sweetness such as whole fruits. Hydration: Drink plenty of water throughout the day. Limit sugary drinks and excessive caffeine intake. Moderate Sodium Intake: Reduce the consumption of high-sodium foods. Use herbs and spices for flavor instead of excessive salt. Meal Planning and Preparation: Plan and prepare meals  ahead of time to make healthier choices more convenient. Include a mix of food groups in each meal. Limit Processed Foods: Minimize the intake of highly processed and packaged foods that are often high in added sugars, salt, and unhealthy fats. Regular Physical Activity: Combine a healthy diet with regular physical activity for overall well-being. Aim for at least 150 minutes of moderate-intensity aerobic exercise per week, along with strength training. Moderation and Balance: Enjoy treats and indulgent foods in moderation, emphasizing balance rather  than strict restriction.  Handouts Provided Include  Detailed MyPlate  Learning Style & Readiness for Change Teaching method utilized: Visual & Auditory  Demonstrated degree of understanding via: Teach Back  Barriers to learning/adherence to lifestyle change: none identified   Goals Established by Pt Have your doctor refer you to a specialist for the thrush as it is concerning you deeply   MONITORING & EVALUATION Dietary intake, weekly physical activity  Next Steps  Patient is to call or email with any future questions or concerns.

## 2023-05-19 ENCOUNTER — Encounter: Payer: Self-pay | Admitting: Family Medicine

## 2023-05-19 ENCOUNTER — Ambulatory Visit: Payer: BC Managed Care – PPO | Admitting: Family Medicine

## 2023-05-19 VITALS — BP 125/83 | HR 75 | Temp 98.4°F | Resp 18 | Ht 66.0 in | Wt 180.2 lb

## 2023-05-19 DIAGNOSIS — L989 Disorder of the skin and subcutaneous tissue, unspecified: Secondary | ICD-10-CM | POA: Diagnosis not present

## 2023-05-19 DIAGNOSIS — Z20818 Contact with and (suspected) exposure to other bacterial communicable diseases: Secondary | ICD-10-CM | POA: Diagnosis not present

## 2023-05-19 LAB — POCT RAPID STREP A (OFFICE): Rapid Strep A Screen: NEGATIVE

## 2023-05-19 NOTE — Progress Notes (Unsigned)
Subjective:     Patient ID: Samantha Delacruz, female    DOB: 1966-03-14, 57 y.o.   MRN: [id number]  Chief Complaint  Patient presents with   Lump    Lump on right elbow, noticed about a week, tenderness to the touch   Sore Throat    Exsposed to strep from a child at school, sore throat started about 4 days ago    HPI  Sore throat-for 4 days-more at 10pm, then better.  No f/c.  Child at school tested + for strep.  Lump R medial elbow.  Was a little tender and felt it and a ball bearing lump felt.  About 1 wk ago.  Still there.  Plays pickleball.  There are no preventive care reminders to display for this patient.  Past Medical History:  Diagnosis Date   Adult ADHD    Anemia    history of   Angio-edema    Asthma    Brain injury (HCC)    Headache    Migraines   Heart palpitations    Hemorrhoids, internal, with bleeding and mucous discharge - Grade 1 02/05/2014   Herpes    Hypertension    Migraines    Pneumonia    PTSD (post-traumatic stress disorder)    has a stalker   Restless leg    Rosacea    Shingles    Urticaria    Vertigo     Past Surgical History:  Procedure Laterality Date   CESAREAN SECTION  08/15/1992   son   COLONOSCOPY     DILATATION & CURETTAGE/HYSTEROSCOPY WITH MYOSURE N/A 04/19/2016   Procedure: DILATATION & CURETTAGE/HYSTEROSCOPY WITH MYOSURE;  Surgeon: Dara Lords, MD;  Location: [facility];  Service: Gynecology;  Laterality: N/A;  request 7:30am start time   Requests one hour OR time.    HEMORRHOID BANDING     retinal peel left eye surgery Left    retina and cataract   thrush       Current Outpatient Medications:    albuterol (PROAIR HFA) 108 (90 Base) MCG/ACT inhaler, INHALE 2 PUFFS EVERY 4 HOURS AS NEEDED FOR WHEEZING OR SHORTNESS OF BREATH, Disp: 8.5 each, Rfl: 2   amLODipine (NORVASC) 5 MG tablet, Take 1 tablet (5 mg total) by mouth daily., Disp: 90 tablet, Rfl: 1   Chlorpheniramine Maleate (ALLERGY PO), Take by mouth., Disp: ,  Rfl:    EPINEPHrine (AUVI-Q) 0.3 mg/0.3 mL IJ SOAJ injection, Inject 0.3 mg into the muscle as needed for anaphylaxis., Disp: 1 each, Rfl: 1   fluticasone (FLONASE) 50 MCG/ACT nasal spray, Place 1 spray into both nostrils daily., Disp: 16 g, Rfl: 0   fluticasone-salmeterol (ADVAIR HFA) 230-21 MCG/ACT inhaler, INHALE 2 PUFFS TWICE A DAY, Disp: 12 each, Rfl: 0   Ginger, Zingiber officinalis, (GINGER PO), Take by mouth., Disp: , Rfl:    Lysine 500 MG CAPS, Take by mouth., Disp: , Rfl:    pantoprazole (PROTONIX) 40 MG tablet, Take 1 tablet (40 mg total) by mouth daily., Disp: 90 tablet, Rfl: 0   TURMERIC PO, Take by mouth., Disp: , Rfl:    valACYclovir (VALTREX) 500 MG tablet, Take 1 tablet (500 mg total) by mouth daily., Disp: 90 tablet, Rfl: 3  Allergies  Allergen Reactions   Latex Rash   Green Dyes Hives    Green, yellow, blue   Influenza Virus Vaccine Nausea And Vomiting   Blue Dyes (Parenteral) Hives   Codeine Hives   Lactose Diarrhea   Lactose  Intolerance (Gi) Diarrhea    cramping   Meloxicam Other (See Comments)   Sulfa Antibiotics Hives   Yellow Dye Hives   ROS neg/noncontributory except as noted HPI/below      Objective:     BP 125/83   Pulse 75   Temp 98.4 F (36.9 C) (Temporal)   Resp 18   Ht 5\' 6"  (1.676 m)   Wt 180 lb 4 oz (81.8 kg)   LMP 05/15/2017   SpO2 95%   BMI 29.09 kg/m  Wt Readings from Last 3 Encounters:  05/19/23 180 lb 4 oz (81.8 kg)  04/12/23 174 lb 1.6 oz (79 kg)  03/15/23 173 lb 4 oz (78.6 kg)    Physical Exam   Gen: WDWN NAD HEENT: NCAT, conjunctiva not injected, sclera nonicteric NECK:  supple, no thyromegaly, no nodes, no carotid bruits CARDIAC: RRR, S1S2+, no murmur. DP 2+B LUNGS: CTAB. No wheezes ABDOMEN:  BS+, soft, NTND, No HSM, no masses EXT:  no edema MSK: no gross abnormalities.  NEURO: A&O x3.  CN II-XII intact.  PSYCH: normal mood. Good eye contact   Check lump    Assessment & Plan:  There are no diagnoses linked to  this encounter.  No follow-ups on file.  Angelena Sole, MD

## 2023-05-24 ENCOUNTER — Ambulatory Visit: Payer: BC Managed Care – PPO | Admitting: Internal Medicine

## 2023-06-12 ENCOUNTER — Other Ambulatory Visit: Payer: Self-pay | Admitting: Family Medicine

## 2023-08-13 ENCOUNTER — Other Ambulatory Visit: Payer: Self-pay | Admitting: Family Medicine

## 2023-08-13 DIAGNOSIS — I10 Essential (primary) hypertension: Secondary | ICD-10-CM

## 2023-08-13 NOTE — Telephone Encounter (Signed)
 Due appt

## 2023-08-15 ENCOUNTER — Other Ambulatory Visit: Payer: Self-pay | Admitting: Internal Medicine

## 2023-08-15 DIAGNOSIS — Z1231 Encounter for screening mammogram for malignant neoplasm of breast: Secondary | ICD-10-CM

## 2023-08-29 ENCOUNTER — Encounter: Payer: Self-pay | Admitting: Family Medicine

## 2023-08-29 ENCOUNTER — Ambulatory Visit: Payer: 59 | Admitting: Family Medicine

## 2023-08-29 VITALS — BP 106/80 | HR 92 | Temp 97.3°F | Ht 66.0 in | Wt 180.8 lb

## 2023-08-29 DIAGNOSIS — B9689 Other specified bacterial agents as the cause of diseases classified elsewhere: Secondary | ICD-10-CM | POA: Diagnosis not present

## 2023-08-29 DIAGNOSIS — I10 Essential (primary) hypertension: Secondary | ICD-10-CM

## 2023-08-29 DIAGNOSIS — J45909 Unspecified asthma, uncomplicated: Secondary | ICD-10-CM | POA: Diagnosis not present

## 2023-08-29 DIAGNOSIS — J329 Chronic sinusitis, unspecified: Secondary | ICD-10-CM | POA: Diagnosis not present

## 2023-08-29 MED ORDER — AMOXICILLIN-POT CLAVULANATE 875-125 MG PO TABS: 1.0000 | ORAL_TABLET | Freq: Two times a day (BID) | ORAL | 0 refills | Status: AC

## 2023-08-29 NOTE — Progress Notes (Signed)
 Phone [phone] In person visit   Subjective:   Samantha Delacruz is a 58 y.o. year old very pleasant female patient who presents for/with See problem oriented charting Chief Complaint  Patient presents with   sinus pressure    Pt c/o upper respiratory infection that has not improved in 6 days. Thinks may have sinus infection. Congestion with green mucus, no cough, pressure in head and cheekbones. DOES NOT WANT TO BE TESTED FOR COVID (she knows this is not covid)    Past Medical History-  Patient Active Problem List   Diagnosis Date Noted   HSV (herpes simplex virus) anogenital infection 01/24/2023   Anxiety 11/14/2021   Uterine leiomyoma 11/14/2021   Anemia 11/09/2021   Angioedema 11/09/2021   Urticaria 11/09/2021   Attention deficit hyperactivity disorder 11/09/2021   Cyst of left breast 11/09/2021   Hemorrhoids 11/09/2021   Migraine 11/09/2021   Restless legs 11/09/2021   Rosacea 11/09/2021   Traumatic brain injury (HCC) 11/09/2021   Other adverse food reactions, not elsewhere classified, subsequent encounter 01/04/2021   Adverse effect of other drugs, medicaments and biological substances, subsequent encounter 01/04/2021   Asthma 01/04/2021   HTN (hypertension) 05/14/2019   Right hip pain 08/21/2014   Palpitations 08/04/2014   Latex allergy  02/05/2014   Rectal bleeding 12/10/2013   Other allergic rhinitis 09/26/2013   Benign paroxysmal positional vertigo 06/20/2013   Seborrheic keratosis, inflamed 05/23/2013   Fibrocystic breast changes 10/02/2012   Overweight 10/02/2012    Medications- reviewed and updated Current Outpatient Medications  Medication Sig Dispense Refill   amLODipine  (NORVASC ) 5 MG tablet Take 1 tablet (5 mg total) by mouth daily. 90 tablet 0   amoxicillin -clavulanate (AUGMENTIN ) 875-125 MG tablet Take 1 tablet by mouth 2 (two) times daily for 7 days. 14 tablet 0   EPINEPHrine  (AUVI-Q ) 0.3 mg/0.3 mL IJ SOAJ injection Inject 0.3 mg into the  muscle as needed for anaphylaxis. 1 each 1   fluticasone  (FLONASE ) 50 MCG/ACT nasal spray Place 1 spray into both nostrils daily. 16 g 0   fluticasone -salmeterol (ADVAIR HFA) 230-21 MCG/ACT inhaler INHALE 2 PUFFS TWICE A DAY 12 each 0   Ginger, Zingiber officinalis, (GINGER PO) Take by mouth.     Lysine 500 MG CAPS Take by mouth.     pantoprazole  (PROTONIX ) 40 MG tablet Take 1 tablet (40 mg total) by mouth daily. 90 tablet 0   TURMERIC PO Take by mouth.     valACYclovir  (VALTREX ) 500 MG tablet Take 1 tablet (500 mg total) by mouth daily. 90 tablet 3   albuterol  (PROAIR  HFA) 108 (90 Base) MCG/ACT inhaler INHALE 2 PUFFS EVERY 4 HOURS AS NEEDED FOR WHEEZING OR SHORTNESS OF BREATH (Patient not taking: Reported on 08/29/2023) 8.5 each 2   Chlorpheniramine Maleate (ALLERGY  PO) Take by mouth. (Patient not taking: Reported on 08/29/2023)     No current facility-administered medications for this visit.     Objective:  BP 106/80   Pulse 92   Temp (!) 97.3 F (36.3 C)   Ht 5' 6 (1.676 m)   Wt 180 lb 12.8 oz (82 kg)   LMP 05/15/2017   SpO2 97%   BMI 29.18 kg/m  Gen: NAD, resting comfortably Left maxillary tenderness, pharynx with mild erythema and drainage, tympanic membrane normal bilaterally, no cervical  lymphadenopathy CV: RRR no murmurs rubs or gallops Lungs: CTAB no crackles, wheeze, rhonchi Ext: no edema Skin: warm, dry     Assessment and Plan   # Sinus congestion pressure  S: Patient reports symptoms started last Wednesday (day 6 today)- itchy eyes and runny nose just to start and progressive since that time. Friday night a lot of mouth breathing- fair amount of cough when she woke up that day.   Reports nasal congestion with green mucus.  No significant cough.  Notes sinus pressure both frontal and maxillary. 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 Has not recently needed albuterol  . Some sore throat - no shortness of breath or wheeze -Neti pot has helped some. Hydrating well -fatigued but no body aches A/P: patient with sinus congestion and pressure along with fatigue and recent double sickening concerning for bacterial sinusitis- we will treat with Augmentin  for 7 days with food. My hope is that she starts to turn the corner within 48 hours but discussed follow up if no better in 7-10 days or sooner if worsens -does have asthma but does not appear flared-  not needing her maintenance inhaler at the moment thankfully - but if she had shortness of breath or wheeze encouraged follow up  -Declines COVID and flu testing  #hypertension S: medication: Amlodipine  5 mg BP Readings from Last 3 Encounters:  08/29/23 106/80  05/19/23 125/83  03/15/23 108/70  A/P: Well-controlled illness-continue current medication -avoid sudafed   Recommended follow le:Mzulmw for as needed for new, worsening, persistent symptoms. Future Appointments  Date Time Provider Department Center  08/31/2023  3:50 PM [facility]    Lab/Order associations:   ICD-10-CM   1. Bacterial sinusitis  J32.9    B96.89     2. Uncomplicated asthma, unspecified asthma severity, unspecified whether persistent  J45.909     3. Primary hypertension  I10       Meds ordered this encounter  Medications   amoxicillin -clavulanate (AUGMENTIN ) 875-125 MG tablet    Sig: Take 1 tablet by mouth 2 (two) times daily for 7 days.    Dispense:  14 tablet    Refill:  0    Return precautions advised.  Garnette Lukes, MD

## 2023-08-29 NOTE — Patient Instructions (Addendum)
 patient with sinus congestion and pressure along with fatigue and recent double sickening concerning for bacterial sinusitis- we will treat with Augmentin  for 7 days with food. My hope is that she starts to turn the corner within 48 hours but discussed follow up if no better in 7-10 days or sooner if worsens -avoid sudafed but thankfully blood pressure ok today  Recommended follow up: Return for as needed for new, worsening, persistent symptoms.

## 2023-08-30 ENCOUNTER — Telehealth: Payer: Self-pay

## 2023-08-30 ENCOUNTER — Other Ambulatory Visit: Payer: Self-pay

## 2023-08-30 MED ORDER — FLUTICASONE-SALMETEROL 230-21 MCG/ACT IN AERO: INHALATION_SPRAY | RESPIRATORY_TRACT | 0 refills | Status: DC

## 2023-08-30 MED ORDER — ALBUTEROL SULFATE HFA 108 (90 BASE) MCG/ACT IN AERS: INHALATION_SPRAY | RESPIRATORY_TRACT | 2 refills | Status: DC

## 2023-08-30 NOTE — Telephone Encounter (Signed)
 Rx sent nothing further needed at this time

## 2023-08-30 NOTE — Telephone Encounter (Signed)
 Copied from CRM [id number]. Topic: Clinical - Medication Question >> Aug 29, 2023  5:06 PM Suzette B wrote: Reason for CRM: fluticasone -salmeterol (ADVAIR HFA) 230-21 MCG/ACT inhaler, patients states that she was given a prescription for antibiotics recently and notices that she didn't have her inhaler listed in the notes pt [facility] that [facility] to [facility] a [facility] but she also requested a call back in regard to the refill shown [phone]  Please see note above; pt requesting refill for inhaler but previously filled by another provider. Ok to refill, please advise

## 2023-08-31 ENCOUNTER — Ambulatory Visit
Admission: RE | Admit: 2023-08-31 | Discharge: 2023-08-31 | Disposition: A | Discharge status: Home / Self Care | Payer: Self-pay | Source: Ambulatory Visit | Attending: Internal Medicine | Admitting: Internal Medicine

## 2023-08-31 DIAGNOSIS — Z1231 Encounter for screening mammogram for malignant neoplasm of breast: Secondary | ICD-10-CM

## 2023-09-01 ENCOUNTER — Telehealth: Payer: Self-pay

## 2023-09-01 NOTE — Telephone Encounter (Signed)
Copied from CRM [id number]. Topic: Clinical - Medication Question >> Aug 31, 2023  4:32 PM Almira Coaster wrote: Reason for CRM: Patient is calling because her inhaler prescriptions are costing to much than she can afford, [facility] is charging $346 and she cannot afford that. She would like to know if there are any alternatives or recommendations.  Please Advise if you have any other recommendations for the pt. I will also reach out to pt to let her know to contact her carrier to see if there is a cheaper alternative.  Message has been sent to provider to address.

## 2023-09-01 NOTE — Telephone Encounter (Signed)
Copied from CRM [id number]. Topic: Clinical - Medication Question >> Aug 31, 2023  4:32 PM Almira Coaster wrote: Reason for CRM: Patient is calling because her inhaler prescriptions are costing to much than she can afford, [facility] is [facility] and she cannot afford that. She would like to know if there are any alternatives or recommendations.  I tried to reach out to pt(No answer) regarding her concerns for the inhaler to see if she could contact her carrier to see if there was a cheaper alternative that they would cover and to let us know what they say so that I could pass the message to provider.

## 2023-09-01 NOTE — Telephone Encounter (Signed)
Patient stated she called insurance, they gave her a list of alternatives, she will call back with what they will cover when she get home.

## 2023-09-05 ENCOUNTER — Other Ambulatory Visit: Payer: Self-pay | Admitting: Family Medicine

## 2023-09-05 MED ORDER — FLUTICASONE-SALMETEROL 230-21 MCG/ACT IN AERO: 2.0000 | INHALATION_SPRAY | Freq: Two times a day (BID) | RESPIRATORY_TRACT | 3 refills | Status: DC

## 2023-09-05 MED ORDER — ALBUTEROL SULFATE HFA 108 (90 BASE) MCG/ACT IN AERS: 2.0000 | INHALATION_SPRAY | Freq: Four times a day (QID) | RESPIRATORY_TRACT | 1 refills | Status: AC | PRN

## 2023-09-05 NOTE — Progress Notes (Signed)
Per [facility], 3 month supply advair

## 2023-09-05 NOTE — Telephone Encounter (Signed)
Copied from CRM [id number]. Topic: Clinical - Prescription Issue >> Sep 05, 2023  1:21 PM Sonny Dandy B wrote: Reason for CRM: pt called to request the generic brand for albuterol (PROAIR HFA) 108 (90 Base) MCG/ACT inhaler . Is requesting a  90 day supply. [facility] [address] once it's completed pt states the brand name medication cost $208 for the medication. Pt is requesting the generic brand for the inhaler.

## 2023-10-02 ENCOUNTER — Encounter: Payer: Self-pay | Admitting: Internal Medicine

## 2023-10-02 ENCOUNTER — Ambulatory Visit: Payer: 59 | Admitting: Internal Medicine

## 2023-10-02 VITALS — BP 138/90 | HR 86 | Temp 97.8°F | Ht 66.0 in | Wt 179.6 lb

## 2023-10-02 DIAGNOSIS — J320 Chronic maxillary sinusitis: Secondary | ICD-10-CM | POA: Diagnosis not present

## 2023-10-02 DIAGNOSIS — J328 Other chronic sinusitis: Secondary | ICD-10-CM

## 2023-10-02 DIAGNOSIS — J209 Acute bronchitis, unspecified: Secondary | ICD-10-CM

## 2023-10-02 DIAGNOSIS — J440 Chronic obstructive pulmonary disease with acute lower respiratory infection: Secondary | ICD-10-CM

## 2023-10-02 DIAGNOSIS — J45909 Unspecified asthma, uncomplicated: Secondary | ICD-10-CM | POA: Diagnosis not present

## 2023-10-02 MED ORDER — BUDESONIDE-FORMOTEROL FUMARATE 160-4.5 MCG/ACT IN AERO
2.0000 | INHALATION_SPRAY | Freq: Two times a day (BID) | RESPIRATORY_TRACT | 1 refills | Status: DC
Start: 2023-10-02 — End: 2023-11-08

## 2023-10-02 MED ORDER — BUDESONIDE-FORMOTEROL FUMARATE 160-4.5 MCG/ACT IN AERO: 2.0000 | INHALATION_SPRAY | Freq: Two times a day (BID) | RESPIRATORY_TRACT | 3 refills | Status: DC

## 2023-10-02 MED ORDER — AMOXICILLIN-POT CLAVULANATE 875-125 MG PO TABS: 1.0000 | ORAL_TABLET | Freq: Two times a day (BID) | ORAL | 0 refills | Status: DC

## 2023-10-02 NOTE — Patient Instructions (Addendum)
VISIT SUMMARY:  Today, we discussed your persistent coughing and breathing issues, which have been ongoing since your recent respiratory illness. We reviewed your symptoms, including the barky cough, postnasal drip, and sinus congestion. We also addressed your asthma management and the difficulties you've had with your maintenance inhaler due to insurance issues.  YOUR PLAN:  -BRONCHITIS: Bronchitis is an inflammation of the main airways in your lungs, often following a respiratory infection. We will start you on Augmentin for a longer duration to help clear the infection and recommend daily saline nasal rinses. A handout on managing sinus infections has been provided.  -CHRONIC SINUSITIS: Chronic sinusitis is a long-term inflammation of the sinuses, leading to symptoms like postnasal drip and sinus congestion. We will continue Augmentin for a longer duration and recommend daily saline nasal rinses. Avoid using a neti pot due to contamination risks. A handout on managing sinus infections has been provided.  -ASTHMA: Asthma is a condition where your airways narrow and swell, making it difficult to breathe. We will prescribe Wixela as a generic alternative to Advair for your maintenance inhaler. Please contact our office if your insurance denies the prescription.  INSTRUCTIONS:  Please follow up if your symptoms persist or recur. We recommend seeing an ENT specialist if you continue to have recurrent sinus infections, as you may need sinus surgery. Sinusitis and associated upper respiratory infections:    A sinus infection is soreness and swelling (inflammation) of your sinuses. Sinuses are hollow spaces in the bones around your face. They are located: Around your eyes. In the middle of your forehead. Behind your nose. In your cheekbones. Your sinuses and nasal passages are lined with a fluid called mucus. Mucus drains out of your sinuses. Swelling can trap mucus in your sinuses. This lets  germs (bacteria, virus, or fungus) grow, which leads to infection. Most of the time, this condition is caused by a virus.  Chronic allergies can increase swelling in the passages of the sinuses, reducing drainage of mucous and causing infections. What are the causes? Allergies. Asthma. Germs. Things that block your nose or sinuses. Growths in the nose (nasal polyps). Chemicals or irritants in the air. A fungus. This is rare. What increases the risk? Having a weak body defense system (immune system). Doing a lot of swimming or diving. Using nasal sprays too much. Smoking. What are the signs or symptoms? The main symptoms of this condition are pain and a feeling of pressure around the sinuses. Other symptoms include: Stuffy nose (congestion). This may make it hard to breathe through your nose. Runny nose (drainage). Soreness, swelling, and warmth in the sinuses. A cough that may get worse at night. Being unable to smell and taste. Mucus that collects in the throat or the back of the nose (postnasal drip). This may cause a sore throat or bad breath. Being very tired (fatigued). A fever.   How can these infections spread into my ear?  The eustachian tube drains to the sinuses from the mental ear, and can become clogged and infected.   How is this diagnosed? Your symptoms. Your medical history. A physical exam. Tests to find out if your condition is short-term (acute) or long-term (chronic). Your doctor may: Check your nose for growths (polyps). Check your sinuses using a tool that has a light on one end (endoscope). Check for allergies or germs. Do imaging tests, such as an MRI or CT scan. How is this treated? The most important thing, regardless of the cause, is the  sinus rinse & steroid nasal spray combo:  Saline misting nasal spray clears allergens, irritants, mucus from sinuses. Use it every time you feel the need to blow your nose and just before using the steroid nasal  spray. This allows the nasal steroids to reach & open Eustachian tubes and reduce symptoms  Tilt head to the side over sink. Insert nozzle into one nostril, depressing on the textured area at the base of the nozzle so a gentle mist fills sinus passages and flows out nostrils. Repeat in other nostril. Additional Treatment for this condition depends on the cause and whether it is short-term or long-term. If caused by a virus, your symptoms should go away on their own within 10 days. You may be given medicines to relieve symptoms. They include: OTC (available over the counter without a prescription) Medicines like robitussin are great to manage multiple symptom(s)  If its COVID then there is a special medicine for that, but no medications for other specific viruses yet Medicines that treat allergies (antihistamines). Benadryl is strongest antihistamine but very drowsy.  Levocetirizine (xyzal) is the strongest non-drowsy antihistamine.  There are antihistamine eye drops  Over-the-counter pain relievers Decongestants: OTC drugs like Afrin (oxymetazoline) or Sudafed (pseudoephedrine) work by reducing the swelling of blood vessels in the nasal passages and Eustachian tubes.  Never use these medications for more than a few days at a time, especially afrin is dependency-forming. If this isn't working or you need more than a few days of afrin or sudafed... you should make an appointment.   These kinds of decongestants are less effective in infections than in allergies.  If caused by bacteria, your doctor may wait to see if you will get better without treatment. Consider starting antibiotics only when: Symptoms Last Over 10 Days: If your symptoms persist beyond 10 days, it might be a sign of a bacterial infection. Severe Symptoms: High fever, significant facial pressure, or severe pain could indicate a need for antibiotics. Ear Pain: Pain radiating to your ears can suggest sinus involvement that might benefit  from antibiotics Thick Creamy Discharge: Thick, yellow, or green mucus may be a sign of a bacterial infection, whether its discharged from the nose, the eyes, or the throat. Worsening Symptoms: If your symptoms initially improve but then worsen, it's important to get re-evaluated.  Our Approach to Your Care: Starting with Non-Antibiotics: In most cases, we can manage sinus infections with rest, over-the-counter pain relievers, and home remedies like nasal irrigation. This helps [place] avoid unnecessary antibiotics. Usually, an infection can be cleared with good sinus hygiene without the risk of antibiotics. Monitoring Your Progress: Let's keep in touch about your symptoms. If they worsen or meet the criteria listed above, please contact us right away. Completing the Course: If antibiotics become necessary, it's crucial to finish the entire prescription to ensure complete healing. Supporting Gut Health: Probiotics can be helpful alongside antibiotics to maintain healthy gut bacteria.  Yogurt is cheaper than the pills.  If caused by growths in the nose, surgery may be needed.  Follow these instructions at home: Medicines Take, use, or apply over-the-counter and prescription medicines only as told by your doctor. These may include nasal sprays. If you were prescribed an antibiotic medicine, take it as told by your doctor. Do not stop taking it even if you start to feel better. Hydrate and humidify  Drink enough water to keep your pee (urine) pale yellow. Use a cool mist humidifier to keep the humidity level in your home  above 50%. Breathe in steam for 10-15 minutes, 3-4 times a day, or as told by your doctor. You can do this in the bathroom while a hot shower is running. Try not to spend time in cool or dry air. Decongest and rinse out your sinuses with daily SimplySaline gentle misting spray- apply any medicated sinus sprays after using this misting to blow your nose. Rest Rest as much as you  can. Sleep with your head raised (elevated). Make sure you get enough sleep each night. General instructions  Put a warm, moist washcloth on your face 3-4 times a day, or as often as told by your doctor. Use nasal saline washes as often as told by your doctor. Wash your hands often with soap and water. If you cannot use soap and water, use hand sanitizer. Do not smoke. Avoid being around people who are smoking (secondhand smoke). Keep all follow-up visits. Contact a doctor if: You have a fever. Your symptoms get worse. Your symptoms do not get better within 10 days. Get help right away if: You have a very bad headache. You cannot stop vomiting. You have very bad pain or swelling around your face or eyes. You have trouble seeing. You feel confused. Your neck is stiff. You have trouble breathing. These symptoms may be an emergency. Get help right away. Call 911. Do not wait to see if the symptoms will go away. Do not drive yourself to the hospital. Summary A sinus infection is swelling of your sinuses. Sinuses are hollow spaces in the bones around your face. This condition is caused by tissues in your nose that become inflamed or swollen. This traps germs. These can lead to infection. Daily sinus rinsing can reduce your risk for these. If you were prescribed an antibiotic medicine, take it as told by your doctor. Do not stop taking it even if you start to feel better. Keep all follow-up visits.

## 2023-10-02 NOTE — Progress Notes (Signed)
==============================  [facility]: [phone]   -- Medical Office Visit --  Patient: Samantha Delacruz      Age: 58 y.o.       Sex:  female  Date:   10/02/2023 Today's Healthcare Provider: Lula Olszewski, MD  ==============================   CHIEF COMPLAINT: Cough (Dry with no other symptoms. For about two weeks. Wheezing and has asthma. Previously had a sinus infection (took antibiotics for it).) and Medication request  (Inhaler.)  SUBJECTIVE: Background This is a 58 y.o. female who has Fibrocystic breast changes; Overweight; Seborrheic keratosis, inflamed; Benign paroxysmal positional vertigo; Other allergic rhinitis; Rectal bleeding; Latex allergy; Palpitations; Right hip pain; HTN (hypertension); Other adverse food reactions, not elsewhere classified, subsequent encounter; Adverse effect of other drugs, medicaments and biological substances, subsequent encounter; Asthma; Anemia; Angioedema; Anxiety; Urticaria; Attention deficit hyperactivity disorder; Cyst of left breast; Hemorrhoids; Migraine; Restless legs; Rosacea; Traumatic brain injury (HCC); Uterine leiomyoma; and HSV (herpes simplex virus) anogenital infection on their problem list. History of Present Illness Samantha Delacruz "[name]" is a 58 year old female with asthma who presents with persistent coughing and breathing issues.  She has been experiencing persistent coughing and breathing issues, which have left her feeling exhausted. The cough is described as 'barky' and is accompanied by a sensation of severe inflammation in the main stem bronchus. Despite a slight decrease in coughing, she continues to feel fatigued.  About ten days ago, she experienced a respiratory illness that she suspects might have been COVID-19 or the flu, lasting about three days. During this time, she had a sensation of her 'brain being on fire' for three days but no fever. Since then, her breathing  issues have persisted.  She reports postnasal drip and a return of cold symptoms, including a significant amount of green, thick nasal discharge on Sunday, but none since then. She has had a lack of appetite for a week, with some nausea at the beginning of her illness but no vomiting or diarrhea.  Her lymph nodes were swollen and itchy, but this has improved. No significant ear or throat symptoms, although they have been slightly sensitive.  Regarding her asthma management, she uses a rescue inhaler and a maintenance inhaler. She has had issues with her insurance not covering her usual maintenance inhaler, requiring a generic version. Her maintenance inhaler did not help with her recent coughing and wheezing, but her rescue inhaler provided some relief.  Past Medical History - Asthma - Whooping cough - Loss of appetite for a week - Nausea - Brain fever for 3 days - Coughing - Chest tightness - Tiredness - Wetting pants from coughing - Swollen lymph nodes - Postnasal drip - Seasonal allergies - Sinus infection   Today's Verbally Confirmed Medications - Rescue inhaler Current Outpatient Medications on File Prior to Visit  Medication Sig   albuterol (PROAIR HFA) 108 (90 Base) MCG/ACT inhaler INHALE 2 PUFFS EVERY 4 HOURS AS NEEDED FOR WHEEZING OR SHORTNESS OF BREATH   albuterol (VENTOLIN HFA) 108 (90 Base) MCG/ACT inhaler Inhale 2 puffs into the lungs every 6 (six) hours as needed for wheezing or shortness of breath.   amLODipine (NORVASC) 5 MG tablet Take 1 tablet (5 mg total) by mouth daily.   Chlorpheniramine Maleate (ALLERGY PO) Take by mouth.   EPINEPHrine (AUVI-Q) 0.3 mg/0.3 mL IJ SOAJ injection Inject 0.3 mg into the muscle as needed for anaphylaxis.   estradiol (ESTRACE) 0.1 MG/GM vaginal cream place 1 gram in  vagina every other day for 10 days then twice weekly for maintenance   fluticasone (FLONASE) 50 MCG/ACT nasal spray Place 1 spray into both nostrils daily.   Ginger,  Zingiber officinalis, (GINGER PO) Take by mouth.   Lysine 500 MG CAPS Take by mouth.   pantoprazole (PROTONIX) 40 MG tablet Take 1 tablet (40 mg total) by mouth daily.   TURMERIC PO Take by mouth.   valACYclovir (VALTREX) 500 MG tablet Take 1 tablet (500 mg total) by mouth daily.   No current facility-administered medications on file prior to visit.   Medications Discontinued During This Encounter  Medication Reason   fluticasone-salmeterol (ADVAIR HFA) 230-21 MCG/ACT inhaler Not covered by the pt's insurance      Objective   Physical Exam     10/02/2023    4:03 PM 08/29/2023   11:13 AM 05/19/2023    3:59 PM  Vitals with BMI  Height 5\' 6"  5\' 6"  5\' 6"   Weight 179 lbs 10 oz 180 lbs 13 oz 180 lbs 4 oz  BMI 29 29.2 29.11  Systolic 138 106 811  Diastolic 90 80 83  Pulse 86 92 75   Wt Readings from Last 10 Encounters:  10/02/23 179 lb 9.6 oz (81.5 kg)  08/29/23 180 lb 12.8 oz (82 kg)  05/19/23 180 lb 4 oz (81.8 kg)  04/12/23 174 lb 1.6 oz (79 kg)  03/15/23 173 lb 4 oz (78.6 kg)  01/24/23 170 lb 4 oz (77.2 kg)  01/02/23 170 lb (77.1 kg)  12/20/22 170 lb (77.1 kg)  11/28/22 168 lb (76.2 kg)  10/19/22 176 lb 2 oz (79.9 kg)   Vital signs reviewed.  Nursing notes reviewed. Weight trend reviewed. Abnormalities and Problem-Specific physical exam findings:  HEENT: Cobblestoning and drainage in the posterior pharynx. Green thick mucus in nares. CHEST: Lungs clear to auscultation.  General Appearance:  No acute distress appreciable.   Well-groomed, healthy-appearing female.  Well proportioned with no abnormal fat distribution.  Good muscle tone. Pulmonary:  Normal work of breathing at rest, no respiratory distress apparent. SpO2: 99 %  Musculoskeletal: All extremities are intact.  Neurological:  Awake, alert, oriented, and engaged.  No obvious focal neurological deficits or cognitive impairments.  Sensorium seems unclouded.   Speech is clear and coherent with logical  content. Psychiatric:  Appropriate mood, pleasant and cooperative demeanor, thoughtful and engaged during the exam    No results found for any visits on 10/02/23. Office Visit on 05/19/2023  Component Date Value   Rapid Strep A Screen 05/19/2023 Negative   Abstract on 10/27/2022  Component Date Value   CHL HPV 02/01/2022 Negative    HM Pap smear 02/01/2022 normal   No image results found. MM 3D SCREENING MAMMOGRAM BILATERAL BREAST Result Date: 09/04/2023 CLINICAL DATA:  Screening. EXAM: DIGITAL SCREENING BILATERAL MAMMOGRAM WITH TOMOSYNTHESIS AND CAD TECHNIQUE: Bilateral screening digital craniocaudal and mediolateral oblique mammograms were obtained. Bilateral screening digital breast tomosynthesis was performed. The images were evaluated with computer-aided detection. COMPARISON:  Previous exam(s). ACR Breast Density Category b: There are scattered areas of fibroglandular density. FINDINGS: There are no findings suspicious for malignancy. IMPRESSION: No mammographic evidence of malignancy. A result letter of this screening mammogram will be mailed directly to the patient. RECOMMENDATION: Screening mammogram in one year. (Code:SM-B-01Y) BI-RADS CATEGORY  1: Negative. Electronically Signed   By: Frederico Hamman M.D.   On: 09/04/2023 08:14  MM 3D SCREENING MAMMOGRAM BILATERAL BREAST Result Date: 09/04/2023 CLINICAL DATA:  Screening. EXAM: DIGITAL SCREENING BILATERAL  MAMMOGRAM WITH TOMOSYNTHESIS AND CAD TECHNIQUE: Bilateral screening digital craniocaudal and mediolateral oblique mammograms were obtained. Bilateral screening digital breast tomosynthesis was performed. The images were evaluated with computer-aided detection. COMPARISON:  Previous exam(s). ACR Breast Density Category b: There are scattered areas of fibroglandular density. FINDINGS: There are no findings suspicious for malignancy. IMPRESSION: No mammographic evidence of malignancy. A result letter of this screening mammogram will be  mailed directly to the patient. RECOMMENDATION: Screening mammogram in one year. (Code:SM-B-01Y) BI-RADS CATEGORY  1: Negative. Electronically Signed   By: Frederico Hamman M.D.   On: 09/04/2023 08:14     Assessment & Plan Other chronic sinusitis Chronic Sinusitis Recurrent sinus infection is characterized by postnasal drip, sinus congestion, and green nasal discharge. Previous treatment with Augmentin was effective but likely did not fully resolve the infection. Examination shows cobblestoning and throat drainage. Continue Augmentin for a longer duration and recommend daily saline nasal rinses. Advise against using a neti pot due to contamination risk and provide a handout on managing sinus infections. Uncomplicated asthma, unspecified asthma severity, unspecified whether persistent Asthma is managed with rescue and maintenance inhalers, but there is difficulty obtaining the maintenance inhaler due to insurance issues. Current symptoms do not indicate an asthma flare. Prescribe Wixela as a generic alternative to Advair and advise contacting the office if insurance denies the prescription. Chronic maxillary sinusitis Chronic Sinusitis Recurrent sinus infection is characterized by postnasal drip, sinus congestion, and green nasal discharge. Previous treatment with Augmentin was effective but likely did not fully resolve the infection. Examination shows cobblestoning and throat drainage. Continue Augmentin for a longer duration and recommend daily saline nasal rinses. Advise against using a neti pot due to contamination risk and provide a handout on managing sinus infections. Acute bronchitis with COPD (HCC) Other providers have sent but insurance not approving her maintenance advair, she asks me to intervene.  Asthma is managed with rescue and maintenance inhalers, but there is difficulty obtaining the maintenance inhaler due to insurance issues. Current symptoms do not indicate an asthma flare.  Prescribe Wixela as a generic alternative to Advair and advise contacting the office if insurance denies the prescription.  Insurance seemed to prefer Symbicort so I sent that to 2 pharmacy. Per patient request       Orders Placed During this Encounter:   Meds ordered this encounter  Medications   budesonide-formoterol (SYMBICORT) 160-4.5 MCG/ACT inhaler    Sig: Inhale 2 puffs into the lungs in the morning and at bedtime.    Dispense:  10.2 g    Refill:  1   amoxicillin-clavulanate (AUGMENTIN) 875-125 MG tablet    Sig: Take 1 tablet by mouth 2 (two) times daily.    Dispense:  20 tablet    Refill:  0   budesonide-formoterol (SYMBICORT) 160-4.5 MCG/ACT inhaler    Sig: Inhale 2 puffs into the lungs 2 (two) times daily.    Dispense:  10.2 g    Refill:  3    Medical Decision Making: [Medical Problems Considered] 1 or more chronic illnesses with exacerbation,  progression, or side effects of treatment 2 or more stable chronic illnesses [Risk Management] Prescription drug management  Diagnosis or treatment significantly limited by social determinants of health  insurance issues and costs impacting her ability to maintain maintenance medications for her asthma.    This document was synthesized by artificial intelligence (Abridge) using HIPAA-compliant recording of the clinical interaction;   We discussed the use of AI scribe software for clinical note transcription with  the patient, who gave verbal consent to proceed.    Additional Info: This encounter employed state-of-the-art, real-time, collaborative documentation. The patient actively reviewed and assisted in updating their electronic medical record on a shared screen, ensuring transparency and facilitating joint problem-solving for the problem list, overview, and plan. This approach promotes accurate, informed care. The treatment plan was discussed and reviewed in detail, including medication safety, potential side effects, and all  patient questions. We confirmed understanding and comfort with the plan. Follow-up instructions were established, including contacting the office for any concerns, returning if symptoms worsen, persist, or new symptoms develop, and precautions for potential emergency department visits.

## 2023-10-02 NOTE — Assessment & Plan Note (Signed)
Asthma is managed with rescue and maintenance inhalers, but there is difficulty obtaining the maintenance inhaler due to insurance issues. Current symptoms do not indicate an asthma flare. Prescribe Wixela as a generic alternative to Advair and advise contacting the office if insurance denies the prescription.

## 2023-10-11 ENCOUNTER — Ambulatory Visit: Payer: 59 | Admitting: Family Medicine

## 2023-10-11 ENCOUNTER — Encounter: Payer: Self-pay | Admitting: Family Medicine

## 2023-10-11 VITALS — BP 115/78 | HR 78 | Temp 97.7°F | Resp 18 | Ht 66.0 in | Wt 180.1 lb

## 2023-10-11 DIAGNOSIS — B9689 Other specified bacterial agents as the cause of diseases classified elsewhere: Secondary | ICD-10-CM | POA: Diagnosis not present

## 2023-10-11 DIAGNOSIS — J329 Chronic sinusitis, unspecified: Secondary | ICD-10-CM | POA: Diagnosis not present

## 2023-10-11 DIAGNOSIS — R059 Cough, unspecified: Secondary | ICD-10-CM | POA: Diagnosis not present

## 2023-10-11 MED ORDER — AZITHROMYCIN 250 MG PO TABS: ORAL_TABLET | ORAL | 0 refills | Status: AC

## 2023-10-11 MED ORDER — PREDNISONE 20 MG PO TABS: 40.0000 mg | ORAL_TABLET | Freq: Every day | ORAL | 0 refills | Status: AC

## 2023-10-11 MED ORDER — FLUCONAZOLE 150 MG PO TABS: 150.0000 mg | ORAL_TABLET | ORAL | 0 refills | Status: DC | PRN

## 2023-10-11 NOTE — Progress Notes (Signed)
 Subjective:     Patient ID: Samantha Delacruz, female    DOB: 1966/04/28, 58 y.o.   MRN: [id number]  Chief Complaint  Patient presents with   Muscle Pain    Bilateral muscle pain in legs, was having  heaviness in legs   Sinusitis    Still having sinus pain, ear discomfort and heaviness in chest   Cough    Still having cough a dry cough    HPI Feb 4 Discussed the use of AI scribe software for clinical note transcription with the patient, who gave verbal consent to proceed.  History of Present Illness   Samantha Delacruz "Samantha Delacruz" is a 58 year old female who presents with persistent cough and sinus pressure following a recent flu-like illness.  She experienced flu-like symptoms starting on February 4th, including a high fever described as 'my brain was on fire' and significant fatigue, which led her to sleep for three days. She also had body aches and discomfort, though not severe pain. The initial symptoms included an explosive cough that began suddenly during a meeting, which she associates with the onset of her illness.  Following the initial illness, she has been dealing with a persistent cough and sinus pressure. She has completed two courses of antibiotics, initially prescribed for sinus issues, but continues to experience sinus pressure and ear pressure, particularly on one side. The cough is improving but still present, with episodes that aggravate her symptoms. No current fevers or chills, but ongoing sinus congestion and a persistent cough are noted. She is not experiencing green or thick nasal discharge, but has coughed up clear, thick mucus.  She has experienced heaviness and tightness in her chest, which she initially managed with Symbicort for four days, but discontinued due to increased pressure. She has not used albuterol recently as her lungs were reportedly clear, but she did use a rescue inhaler during the flu for breathing difficulties, which provided relief.  She  reports experiencing leg pain and pressure, which she initially attributed to a possible side effect of antibiotics but later recalled it might be due to physical activity with a child. The pain is described as unusual and concerning, affecting both legs.  She is concerned about the possibility of recurring illness and wishes to be proactive in managing her symptoms. She has a history of sinus issues and has been on sinus medication in January prior to the flu. She is also concerned about potential yeast infections due to antibiotic use and has requested Diflucan.       There are no preventive care reminders to display for this patient.  Past Medical History:  Diagnosis Date   Adult ADHD    Anemia    history of   Angio-edema    Asthma    Brain injury (HCC)    Headache    Migraines   Heart palpitations    Hemorrhoids, internal, with bleeding and mucous discharge - Grade 1 02/05/2014   Herpes    Hypertension    Migraines    Pneumonia    PTSD (post-traumatic stress disorder)    has a stalker   Restless leg    Rosacea    Shingles    Urticaria    Vertigo     Past Surgical History:  Procedure Laterality Date   CESAREAN SECTION  08/15/1992   son   COLONOSCOPY     DILATATION & CURETTAGE/HYSTEROSCOPY WITH MYOSURE N/A 04/19/2016   Procedure: DILATATION & CURETTAGE/HYSTEROSCOPY WITH MYOSURE;  Surgeon:  Dara Lords, MD;  Location: [facility];  Service: Gynecology;  Laterality: N/A;  request 7:30am start time   Requests one hour OR time.    HEMORRHOID BANDING     retinal peel left eye surgery Left    retina and cataract   thrush       Current Outpatient Medications:    albuterol (PROAIR HFA) 108 (90 Base) MCG/ACT inhaler, INHALE 2 PUFFS EVERY 4 HOURS AS NEEDED FOR WHEEZING OR SHORTNESS OF BREATH, Disp: 8.5 each, Rfl: 2   albuterol (VENTOLIN HFA) 108 (90 Base) MCG/ACT inhaler, Inhale 2 puffs into the lungs every 6 (six) hours as needed for wheezing or shortness of breath., Disp:  3 each, Rfl: 1   amLODipine (NORVASC) 5 MG tablet, Take 1 tablet (5 mg total) by mouth daily., Disp: 90 tablet, Rfl: 0   amoxicillin-clavulanate (AUGMENTIN) 875-125 MG tablet, Take 1 tablet by mouth 2 (two) times daily., Disp: 20 tablet, Rfl: 0   azithromycin (ZITHROMAX) 250 MG tablet, Take 2 tablets on day 1, then 1 tablet daily on days 2 through 5, Disp: 6 tablet, Rfl: 0   budesonide-formoterol (SYMBICORT) 160-4.5 MCG/ACT inhaler, Inhale 2 puffs into the lungs in the morning and at bedtime., Disp: 10.2 g, Rfl: 1   budesonide-formoterol (SYMBICORT) 160-4.5 MCG/ACT inhaler, Inhale 2 puffs into the lungs 2 (two) times daily., Disp: 10.2 g, Rfl: 3   Chlorpheniramine Maleate (ALLERGY PO), Take by mouth., Disp: , Rfl:    EPINEPHrine (AUVI-Q) 0.3 mg/0.3 mL IJ SOAJ injection, Inject 0.3 mg into the muscle as needed for anaphylaxis., Disp: 1 each, Rfl: 1   estradiol (ESTRACE) 0.1 MG/GM vaginal cream, place 1 gram in vagina every other day for 10 days then twice weekly for maintenance, Disp: , Rfl:    fluconazole (DIFLUCAN) 150 MG tablet, Take 1 tablet (150 mg total) by mouth every three (3) days as needed., Disp: 2 tablet, Rfl: 0   fluticasone (FLONASE) 50 MCG/ACT nasal spray, Place 1 spray into both nostrils daily., Disp: 16 g, Rfl: 0   Ginger, Zingiber officinalis, (GINGER PO), Take by mouth., Disp: , Rfl:    Lysine 500 MG CAPS, Take by mouth., Disp: , Rfl:    pantoprazole (PROTONIX) 40 MG tablet, Take 1 tablet (40 mg total) by mouth daily., Disp: 90 tablet, Rfl: 0   predniSONE (DELTASONE) 20 MG tablet, Take 2 tablets (40 mg total) by mouth daily with breakfast for 5 days., Disp: 10 tablet, Rfl: 0   TURMERIC PO, Take by mouth., Disp: , Rfl:    valACYclovir (VALTREX) 500 MG tablet, Take 1 tablet (500 mg total) by mouth daily., Disp: 90 tablet, Rfl: 3  Allergies  Allergen Reactions   Latex Rash   Green Dyes Hives    Green, yellow, blue   Influenza Virus Vaccine Nausea And Vomiting   Blue Dyes  (Parenteral) Hives    blue dye   Codeine Hives   Lactose Diarrhea   Lactose Intolerance (Gi) Diarrhea    cramping   Meloxicam Other (See Comments)    meloxicam   Sulfa Antibiotics Hives   Yellow Dye Hives   ROS neg/noncontributory except as noted HPI/below      Objective:     BP 115/78   Pulse 78   Temp 97.7 F (36.5 C) (Temporal)   Resp 18   Ht 5\' 6"  (1.676 m)   Wt 180 lb 2 oz (81.7 kg)   LMP 05/15/2017   SpO2 97%   BMI 29.07 kg/m  Wt Readings from Last 3 Encounters:  10/11/23 180 lb 2 oz (81.7 kg)  10/02/23 179 lb 9.6 oz (81.5 kg)  08/29/23 180 lb 12.8 oz (82 kg)    Physical Exam   Gen: WDWN NAD HEENT: NCAT, conjunctiva not injected, sclera nonicteric TM WNL B, OP moist, no exudates  NECK:  supple, no thyromegaly, no nodes,  CARDIAC: RRR, S1S2+, no murmur.  LUNGS: CTAB. No wheezes EXT:  no edema MSK: no gross abnormalities. Ms 5/5 ble NEURO: A&O x3.  CN II-XII intact.  PSYCH: normal mood. Good eye contact  Reviewed notes from 2025     Assessment & Plan:  Bacterial sinusitis  Cough, unspecified type  Other orders -     Fluconazole; Take 1 tablet (150 mg total) by mouth every three (3) days as needed.  Dispense: 2 tablet; Refill: 0 -     Azithromycin; Take 2 tablets on day 1, then 1 tablet daily on days 2 through 5  Dispense: 6 tablet; Refill: 0 -     predniSONE; Take 2 tablets (40 mg total) by mouth daily with breakfast for 5 days.  Dispense: 10 tablet; Refill: 0  Assessment and Plan    Persistent Cough and Sinusitis Persistent cough and sinus pressure have been present since early February, following flu-like symptoms, with partial improvement after Augmentin. Current symptoms include sinus and ear pressure, along with a lingering cough. Lungs are clear on examination, but there is chest heaviness. The differential diagnosis includes lingering viral infection versus bacterial sinusitis. Discussed the risks of continued infection, benefits of proposed  treatments, and alternatives. Emphasized the importance of completing prescribed medications and monitoring symptoms. Start Z-Pak and prednisone to reduce lung inflammation. Prescribe Diflucan for potential antibiotic-induced yeast infection. Advise using an albuterol inhaler for chest tightness and heaviness. Discontinue nasal spray if it causes distress.  Muscle Pain Bilateral leg pain is likely due to muscle strain from physical activity, with no signs of heart failure or other severe conditions. Provided reassurance about muscle strain and discussed expected recovery outcomes. Prednisone may help with muscle pain.  General Health Maintenance Schedule a follow-up appointment for general health maintenance.  Follow-up Send prescriptions to [facility]. Advise returning if symptoms worsen or do not improve.        Return for soon, chronic follow-up.  Angelena Sole, MD

## 2023-10-11 NOTE — Patient Instructions (Signed)
 It was very nice to see you today!  Meds sent   PLEASE NOTE:  If you had any lab tests please let us know if you have not heard back within a few days. You may see your results on MyChart before we have a chance to review them but we will give you a call once they are reviewed by [place]. If we ordered any referrals today, please let us know if you have not heard from their office within the next week.   Please try these tips to maintain a healthy lifestyle:  Eat most of your calories during the day when you are active. Eliminate processed foods including packaged sweets (pies, cakes, cookies), reduce intake of potatoes, white bread, white pasta, and white rice. Look for whole grain options, oat flour or almond flour.  Each meal should contain half fruits/vegetables, one quarter protein, and one quarter carbs (no bigger than a computer mouse).  Cut down on sweet beverages. This includes juice, soda, and sweet tea. Also watch fruit intake, though this is a healthier sweet option, it still contains natural sugar! Limit to 3 servings daily.  Drink at least 1 glass of water with each meal and aim for at least 8 glasses per day  Exercise at least 150 minutes every week.

## 2023-10-21 ENCOUNTER — Other Ambulatory Visit: Payer: Self-pay | Admitting: Family Medicine

## 2023-11-08 ENCOUNTER — Encounter: Payer: Self-pay | Admitting: Family Medicine

## 2023-11-08 ENCOUNTER — Ambulatory Visit: Payer: 59 | Admitting: Family Medicine

## 2023-11-08 VITALS — BP 132/83 | HR 73 | Temp 97.9°F | Resp 16 | Ht 66.0 in | Wt 182.2 lb

## 2023-11-08 DIAGNOSIS — K219 Gastro-esophageal reflux disease without esophagitis: Secondary | ICD-10-CM | POA: Insufficient documentation

## 2023-11-08 DIAGNOSIS — R7303 Prediabetes: Secondary | ICD-10-CM

## 2023-11-08 DIAGNOSIS — J45909 Unspecified asthma, uncomplicated: Secondary | ICD-10-CM

## 2023-11-08 DIAGNOSIS — I10 Essential (primary) hypertension: Secondary | ICD-10-CM

## 2023-11-08 DIAGNOSIS — J328 Other chronic sinusitis: Secondary | ICD-10-CM

## 2023-11-08 DIAGNOSIS — J301 Allergic rhinitis due to pollen: Secondary | ICD-10-CM

## 2023-11-08 MED ORDER — BUDESONIDE-FORMOTEROL FUMARATE 160-4.5 MCG/ACT IN AERO
2.0000 | INHALATION_SPRAY | Freq: Two times a day (BID) | RESPIRATORY_TRACT | 3 refills | Status: AC
Start: 2023-11-08 — End: ?

## 2023-11-08 MED ORDER — PANTOPRAZOLE SODIUM 40 MG PO TBEC
40.0000 mg | DELAYED_RELEASE_TABLET | Freq: Every day | ORAL | 1 refills | Status: DC
Start: 2023-11-08 — End: 2024-06-04

## 2023-11-08 MED ORDER — AZELASTINE HCL 0.1 % NA SOLN: 2.0000 | Freq: Two times a day (BID) | NASAL | 4 refills | Status: AC | PRN

## 2023-11-08 MED ORDER — FLUTICASONE PROPIONATE 50 MCG/ACT NA SUSP: 1.0000 | Freq: Every day | NASAL | 3 refills | Status: AC

## 2023-11-08 MED ORDER — AMLODIPINE BESYLATE 5 MG PO TABS: 5.0000 mg | ORAL_TABLET | Freq: Every day | ORAL | 1 refills | Status: DC

## 2023-11-08 NOTE — Progress Notes (Signed)
 Subjective:     Patient ID: Samantha Delacruz, female    DOB: February 04, 1966, 58 y.o.   MRN: [id number]  Chief Complaint  Patient presents with   Medical Management of Chronic Issues    Chronic follow-up to discuss blood pressure, etc Allergy issue, discuss if she need to be on medication Discuss vision changes and possible medication    HPI  HTN-Pt is on amlodipine 5mg .  Bp's running not checking.  No ha/dizziness/cp/palp/edema/cough/shortness of breath.  Working on diet a lot  Bloating-quit sodas, gluten and still bloating.     GERD-taking protonix 40 mg daily-doing better.  Had EGD w/dilitation in past.  No dysphagia  Herpes-rare break out.  Takes valtrex daily. preDM-working some on diet-not quite sure what all is ok.  Hard to exercise due to leg pain Discussed the use of AI scribe software for clinical note transcription with the patient, who gave verbal consent to proceed.  History of Present Illness Samantha Delacruz "Samantha Delacruz" is a 58 year old female with hypertension who presents for a follow-up visit.  She is currently taking amlodipine 5 mg for hypertension, initially prescribed due to consistently high blood pressure and associated headaches. She feels better when not on the medication, describing a sensation of feeling 'lighter', but acknowledges the need to monitor her blood pressure regularly. She has not been consistent with checking her blood pressure at home. She experienced a racing heart after consuming caffeine, which she had been avoiding for some time. Occasional chest wall pain improved with prednisone.  She has a history of sinus infections treated with antibiotics and prednisone. Her sinuses feel like they are filling up again, possibly due to springtime allergies or her cat, to which she is allergic. She has not been taking any allergy medication recently due to side effects of drowsiness.  She experiences gastroesophageal reflux disease (GERD) and is taking  daily medication for it. Symptoms have improved, but she continues to take the medication daily. A previous upper endoscopy and colonoscopy did not reveal significant findings, although she felt like food was getting stuck at times.  She has been diagnosed with prediabetes but did not find her consultation with a nutritionist helpful. She has not had recent testing to confirm the status of her prediabetes.  She reports hip pain exacerbated by physical activity, such as high kicks, which resolved after a 'pop'. She has set a goal to stretch and lift weights daily to prevent future issues.  She mentions experiencing nerve pain on her head and down her leg, which she thought might be related to shingles, although she did not have a rash. She has received the shingles vaccine.    There are no preventive care reminders to display for this patient.  Past Medical History:  Diagnosis Date   Adult ADHD    Anemia    history of   Angio-edema    Asthma    Brain injury (HCC)    Headache    Migraines   Heart palpitations    Hemorrhoids, internal, with bleeding and mucous discharge - Grade 1 02/05/2014   Herpes    Hypertension    Migraines    Pneumonia    PTSD (post-traumatic stress disorder)    has a stalker   Restless leg    Rosacea    Shingles    Urticaria    Vertigo     Past Surgical History:  Procedure Laterality Date   CESAREAN SECTION  08/15/1992   son  COLONOSCOPY     DILATATION & CURETTAGE/HYSTEROSCOPY WITH MYOSURE N/A 04/19/2016   Procedure: DILATATION & CURETTAGE/HYSTEROSCOPY WITH MYOSURE;  Surgeon: Dara Lords, MD;  Location: [facility];  Service: Gynecology;  Laterality: N/A;  request 7:30am start time   Requests one hour OR time.    HEMORRHOID BANDING     retinal peel left eye surgery Left    retina and cataract   thrush       Current Outpatient Medications:    albuterol (VENTOLIN HFA) 108 (90 Base) MCG/ACT inhaler, Inhale 2 puffs into the lungs every 6 (six)  hours as needed for wheezing or shortness of breath., Disp: 3 each, Rfl: 1   azelastine (ASTELIN) 0.1 % nasal spray, Place 2 sprays into both nostrils 2 (two) times daily as needed for rhinitis. Use in each nostril as directed, Disp: 30 mL, Rfl: 4   Chlorpheniramine Maleate (ALLERGY PO), Take by mouth., Disp: , Rfl:    EPINEPHrine (AUVI-Q) 0.3 mg/0.3 mL IJ SOAJ injection, Inject 0.3 mg into the muscle as needed for anaphylaxis., Disp: 1 each, Rfl: 1   estradiol (ESTRACE) 0.1 MG/GM vaginal cream, place 1 gram in vagina every other day for 10 days then twice weekly for maintenance, Disp: , Rfl:    Ginger, Zingiber officinalis, (GINGER PO), Take by mouth., Disp: , Rfl:    Lysine 500 MG CAPS, Take by mouth., Disp: , Rfl:    TURMERIC PO, Take by mouth., Disp: , Rfl:    valACYclovir (VALTREX) 500 MG tablet, Take 1 tablet (500 mg total) by mouth daily., Disp: 90 tablet, Rfl: 3   amLODipine (NORVASC) 5 MG tablet, Take 1 tablet (5 mg total) by mouth daily., Disp: 90 tablet, Rfl: 1   budesonide-formoterol (SYMBICORT) 160-4.5 MCG/ACT inhaler, Inhale 2 puffs into the lungs 2 (two) times daily., Disp: 10.2 g, Rfl: 3   fluticasone (FLONASE) 50 MCG/ACT nasal spray, Place 1 spray into both nostrils daily., Disp: 16 g, Rfl: 3   pantoprazole (PROTONIX) 40 MG tablet, Take 1 tablet (40 mg total) by mouth daily., Disp: 90 tablet, Rfl: 1  Allergies  Allergen Reactions   Latex Rash   Green Dyes Hives    Green, yellow, blue   Influenza Virus Vaccine Nausea And Vomiting   Blue Dyes (Parenteral) Hives    blue dye   Codeine Hives   Lactose Diarrhea   Lactose Intolerance (Gi) Diarrhea    cramping   Meloxicam Other (See Comments)    meloxicam   Sulfa Antibiotics Hives   Yellow Dye Hives   ROS neg/noncontributory except as noted HPI/below      Objective:     BP 132/83   Pulse 73   Temp 97.9 F (36.6 C) (Temporal)   Resp 16   Ht 5\' 6"  (1.676 m)   Wt 182 lb 4 oz (82.7 kg)   LMP 05/15/2017   SpO2 98%    BMI 29.42 kg/m  Wt Readings from Last 3 Encounters:  11/08/23 182 lb 4 oz (82.7 kg)  10/11/23 180 lb 2 oz (81.7 kg)  10/02/23 179 lb 9.6 oz (81.5 kg)    Physical Exam   Gen: WDWN NAD HEENT: NCAT, conjunctiva not injected, sclera nonicteric NECK:  supple, no thyromegaly, no nodes, no carotid bruits CARDIAC: RRR, S1S2+, no murmur. DP 2+B LUNGS: CTAB. No wheezes ABDOMEN:  BS+, soft, NTND, No HSM, no masses EXT:  no edema MSK: no gross abnormalities.  NEURO: A&O x3.  CN II-XII intact.  PSYCH: normal mood. Good  eye contact     Assessment & Plan:  Primary hypertension  Gastroesophageal reflux disease without esophagitis -     Pantoprazole Sodium; Take 1 tablet (40 mg total) by mouth daily.  Dispense: 90 tablet; Refill: 1  Prediabetes  Non-seasonal allergic rhinitis due to pollen -     Fluticasone Propionate; Place 1 spray into both nostrils daily.  Dispense: 16 g; Refill: 3 -     Azelastine HCl; Place 2 sprays into both nostrils 2 (two) times daily as needed for rhinitis. Use in each nostril as directed  Dispense: 30 mL; Refill: 4  Essential hypertension -     amLODIPine Besylate; Take 1 tablet (5 mg total) by mouth daily.  Dispense: 90 tablet; Refill: 1  Uncomplicated asthma, unspecified asthma severity, unspecified whether persistent -     Budesonide-Formoterol Fumarate; Inhale 2 puffs into the lungs 2 (two) times daily.  Dispense: 10.2 g; Refill: 3  Other chronic sinusitis -     Budesonide-Formoterol Fumarate; Inhale 2 puffs into the lungs 2 (two) times daily.  Dispense: 10.2 g; Refill: 3  Assessment and Plan Assessment & Plan Hypertension   Hypertension is managed with amlodipine 5 mg. She feels better off medication but understands the importance of monitoring blood pressure. No recent readings are available. Previously experienced headaches and elevated readings, but currently no chest pain, palpitations, or dyspnea. Premature ventricular contractions may be linked to  caffeine. She is interested in discontinuing amlodipine if blood pressure remains controlled. Continue amlodipine 5 mg daily. Monitor blood pressure once daily for a few weeks and report readings via phone or fax. Discuss potential discontinuation of amlodipine if blood pressure remains controlled.  Gastroesophageal Reflux Disease (GERD)   GERD symptoms have improved with daily pantoprazole. No recent dysphagia, but occasional reflux is managed with dietary changes. Previous endoscopy showed no significant findings, though dilation was performed. Continue pantoprazole daily and maintain dietary modifications, including eating dinner earlier. Follow up with gastroenterology as needed.  Allergic Rhinitis   Symptoms include itchy eyes and sinus congestion, possibly worsened by allergens like pollen and cat dander. A previous sinus infection was treated with prednisone and antibiotics. Reports nighttime sinus congestion and is not on allergy medication due to side effects. Start Flonase nasal spray daily and use Astelin nasal spray twice daily as needed for allergy symptoms.  Prediabetes   A previous nutritionist consultation was unhelpful, and no recent tests have confirmed prediabetes status since 2023. She is interested in further evaluation. Order a blood test to assess for prediabetes.  General Health Maintenance   At 58 years old, she has not had a recent physical examination or blood work. Discussed the need for regular health maintenance and screenings. Schedule a physical examination and blood work in one month, considering fasting before blood work.  Follow-up   Follow-up plans were discussed for various conditions and health maintenance. There are issues with the inhaler prescription at [facility]. Follow up in one month for a physical examination and blood work. Check on the inhaler prescription at [facility] and follow up with the pharmacy if issues persist.     Return in about 4 weeks (around  12/06/2023) for annual physical.  Angelena Sole, MD

## 2023-11-08 NOTE — Patient Instructions (Signed)
 It was very nice to see you today!  Fluticisone nasal daily Astelin nasal twice daily as needed for allergies   PLEASE NOTE:  If you had any lab tests please let us know if you have not heard back within a few days. You may see your results on MyChart before we have a chance to review them but we will give you a call once they are reviewed by [place]. If we ordered any referrals today, please let us know if you have not heard from their office within the next week.   Please try these tips to maintain a healthy lifestyle:  Eat most of your calories during the day when you are active. Eliminate processed foods including packaged sweets (pies, cakes, cookies), reduce intake of potatoes, white bread, white pasta, and white rice. Look for whole grain options, oat flour or almond flour.  Each meal should contain half fruits/vegetables, one quarter protein, and one quarter carbs (no bigger than a computer mouse).  Cut down on sweet beverages. This includes juice, soda, and sweet tea. Also watch fruit intake, though this is a healthier sweet option, it still contains natural sugar! Limit to 3 servings daily.  Drink at least 1 glass of water with each meal and aim for at least 8 glasses per day  Exercise at least 150 minutes every week.

## 2023-11-30 ENCOUNTER — Ambulatory Visit: Admitting: Family

## 2023-11-30 ENCOUNTER — Encounter: Payer: Self-pay | Admitting: Family

## 2023-11-30 VITALS — BP 103/75 | HR 95 | Temp 97.2°F | Ht 66.0 in | Wt 178.2 lb

## 2023-11-30 DIAGNOSIS — J3081 Allergic rhinitis due to animal (cat) (dog) hair and dander: Secondary | ICD-10-CM | POA: Diagnosis not present

## 2023-11-30 DIAGNOSIS — J0111 Acute recurrent frontal sinusitis: Secondary | ICD-10-CM | POA: Diagnosis not present

## 2023-11-30 MED ORDER — AMOXICILLIN-POT CLAVULANATE 875-125 MG PO TABS
1.0000 | ORAL_TABLET | Freq: Two times a day (BID) | ORAL | 0 refills | Status: DC
Start: 2023-11-30 — End: 2023-12-07

## 2023-11-30 NOTE — Patient Instructions (Signed)
 It was very nice to see you today!   I have sent over a new antibiotic for you to start, be sure and take about 20-56min after a meal. Start using the Fluticasone nasal spray, 1 squirt each nostril twice a day for 2 days, then once a day until sinus symptoms are under control. Use the Azelastine nasal spray only if having a persistent runny nose. Drink plenty of water. I have sent over a new Allergy referral to our office in [facility].      PLEASE NOTE:  If you had any lab tests please let us  know if you have not heard back within a few days. You may see your results on MyChart before we have a chance to review them but we will give you a call once they are reviewed by us . If we ordered any referrals today, please let us  know if you have not heard from their office within the next week.

## 2023-11-30 NOTE — Progress Notes (Signed)
 Patient ID: Samantha Delacruz, female    DOB: Jan 07, 1966, 58 y.o.   MRN: [id number]  Chief Complaint  Patient presents with   Sinus Problem    Pt c/o cough with green mucus, sinus drainage and pressure after mold in car, present since 03/30.  Has tried OTC nasal saline which caused headaches.   Discussed the use of AI scribe software for clinical note transcription with the patient, who gave verbal consent to proceed.  History of Present Illness The patient, with a history of traumatic brain injury, presents with sinus congestion and drainage. She reports a recent exposure to mold in a car and a possible ingestion of moldy food. The pt also has a known allergy to cat dander. The symptoms started on the 13th of this month, after a trip to [place] . She describes the symptoms as 'so much pressure' and 'so much green gunk coming out.' She also reports a sore throat that has since resolved and intermittent ear popping. She has been using a non-medicated saline nasal spray, which she reports gives her a headache. She has not started the Flonase nasal spray or antihistamine nasal spray prescribed by her PCP last month as she does not like taking medicines. She also has an inhaler, which she uses as needed. She reports a history of vertigo that sometimes gets worse with flights.  Assessment & Plan Sinusitis Recurrent sinusitis with congestion, drainage, and sinus pressure. Symptoms recurred after mold exposure and travel. Flonase recommended for decongestion. Avoid Afrin due to rebound congestion risk. - STart Flonase (Fluticasone) nasal spray, one spray in each nostril twice daily for a few days, then once daily or every other day for at least a month to get through allergy season. - Start Augmentin, twice a day after eating. - Encourage hydration and saline spray use to clear pollen and disinfect sinuses. - Sending referral to new Allergist for 2nd opinion. - Educated on adding OTC generic Claritin  or Xyzal for itchy, sneezing, and runny nose symptoms if needed after starting the Flonase nasal spray.  Allergic Rhinitis Symptoms exacerbated by mold, cat and pollen exposure. Increased symptoms due to high pollen levels. Flonase recommended as primary treatment, Claritin as adjunct if needed. - Sending referral to new Allergist for 2nd opinion. - Use Flonase nasal spray as directed for sinusitis. - Consider OTC generic Claritin for additional allergy symptom relief if needed.      Subjective:    Outpatient Medications Prior to Visit  Medication Sig Dispense Refill   albuterol (VENTOLIN HFA) 108 (90 Base) MCG/ACT inhaler Inhale 2 puffs into the lungs every 6 (six) hours as needed for wheezing or shortness of breath. 3 each 1   amLODipine (NORVASC) 5 MG tablet Take 1 tablet (5 mg total) by mouth daily. 90 tablet 1   EPINEPHrine (AUVI-Q) 0.3 mg/0.3 mL IJ SOAJ injection Inject 0.3 mg into the muscle as needed for anaphylaxis. 1 each 1   Ginger, Zingiber officinalis, (GINGER PO) Take by mouth.     OVER THE COUNTER MEDICATION probiotic     OVER THE COUNTER MEDICATION Naturello multivitamin     OVER THE COUNTER MEDICATION Women garden of life- once daily women     pantoprazole (PROTONIX) 40 MG tablet Take 1 tablet (40 mg total) by mouth daily. 90 tablet 1   TURMERIC PO Take by mouth.     valACYclovir (VALTREX) 500 MG tablet Take 1 tablet (500 mg total) by mouth daily. 90 tablet 3   azelastine (ASTELIN)  0.1 % nasal spray Place 2 sprays into both nostrils 2 (two) times daily as needed for rhinitis. Use in each nostril as directed (Patient not taking: Reported on 11/30/2023) 30 mL 4   budesonide-formoterol (SYMBICORT) 160-4.5 MCG/ACT inhaler Inhale 2 puffs into the lungs 2 (two) times daily. (Patient not taking: Reported on 11/30/2023) 10.2 g 3   fluticasone (FLONASE) 50 MCG/ACT nasal spray Place 1 spray into both nostrils daily. (Patient not taking: Reported on 11/30/2023) 16 g 3   Chlorpheniramine  Maleate (ALLERGY PO) Take by mouth. (Patient not taking: Reported on 11/30/2023)     estradiol (ESTRACE) 0.1 MG/GM vaginal cream place 1 gram in vagina every other day for 10 days then twice weekly for maintenance (Patient not taking: Reported on 11/30/2023)     Lysine 500 MG CAPS Take by mouth. (Patient not taking: Reported on 11/30/2023)     No facility-administered medications prior to visit.   Past Medical History:  Diagnosis Date   Adult ADHD    Anemia    history of   Angio-edema    Asthma    Brain injury (HCC)    Headache    Migraines   Heart palpitations    Hemorrhoids, internal, with bleeding and mucous discharge - Grade 1 02/05/2014   Herpes    Hypertension    Migraines    Pneumonia    PTSD (post-traumatic stress disorder)    has a stalker   Restless leg    Rosacea    Shingles    Urticaria    Vertigo    Past Surgical History:  Procedure Laterality Date   CESAREAN SECTION  08/15/1992   son   COLONOSCOPY     DILATATION & CURETTAGE/HYSTEROSCOPY WITH MYOSURE N/A 04/19/2016   Procedure: DILATATION & CURETTAGE/HYSTEROSCOPY WITH MYOSURE;  Surgeon: Dara Lords, MD;  Location: [facility];  Service: Gynecology;  Laterality: N/A;  request 7:30am start time   Requests one hour OR time.    HEMORRHOID BANDING     retinal peel left eye surgery Left    retina and cataract   thrush     Allergies  Allergen Reactions   Latex Rash   Green Dyes Hives    Green, yellow, blue   Influenza Virus Vaccine Nausea And Vomiting   Blue Dyes (Parenteral) Hives    blue dye   Codeine Hives   Lactose Diarrhea   Lactose Intolerance (Gi) Diarrhea    cramping   Meloxicam Other (See Comments)    meloxicam   Sulfa Antibiotics Hives   Yellow Dye Hives      Objective:    Physical Exam Vitals and nursing note reviewed.  Constitutional:      Appearance: Normal appearance. She is ill-appearing.     Interventions: Face mask in place.  HENT:     Right Ear: Tympanic membrane and ear  canal normal.     Left Ear: Tympanic membrane and ear canal normal.     Nose:     Right Sinus: Frontal sinus tenderness present.     Left Sinus: Frontal sinus tenderness present.     Mouth/Throat:     Mouth: Mucous membranes are moist.     Pharynx: Posterior oropharyngeal erythema present. No pharyngeal swelling, oropharyngeal exudate or uvula swelling.     Tonsils: No tonsillar exudate or tonsillar abscesses.  Cardiovascular:     Rate and Rhythm: Normal rate and regular rhythm.  Pulmonary:     Effort: Pulmonary effort is normal.  Breath sounds: Normal breath sounds.  Musculoskeletal:        General: Normal range of motion.  Lymphadenopathy:     Head:     Right side of head: No preauricular or posterior auricular adenopathy.     Left side of head: No preauricular or posterior auricular adenopathy.     Cervical: No cervical adenopathy.  Skin:    General: Skin is warm and dry.  Neurological:     Mental Status: She is alert.  Psychiatric:        Mood and Affect: Mood normal.        Behavior: Behavior normal.    BP 103/75 (BP Location: Left Arm, Patient Position: Sitting, Cuff Size: Large)   Pulse 95   Temp (!) 97.2 F (36.2 C) (Temporal)   Ht 5\' 6"  (1.676 m)   Wt 178 lb 4 oz (80.9 kg)   LMP 05/15/2017   SpO2 96%   BMI 28.77 kg/m  Wt Readings from Last 3 Encounters:  11/30/23 178 lb 4 oz (80.9 kg)  11/08/23 182 lb 4 oz (82.7 kg)  10/11/23 180 lb 2 oz (81.7 kg)      Versa Gore, NP

## 2023-12-01 ENCOUNTER — Telehealth: Admitting: Physician Assistant

## 2023-12-01 ENCOUNTER — Ambulatory Visit: Payer: Self-pay

## 2023-12-01 DIAGNOSIS — H109 Unspecified conjunctivitis: Secondary | ICD-10-CM

## 2023-12-01 MED ORDER — MOXIFLOXACIN HCL 0.5 % OP SOLN: 1.0000 [drp] | Freq: Three times a day (TID) | OPHTHALMIC | 0 refills | Status: AC

## 2023-12-01 NOTE — Telephone Encounter (Signed)
 Chief Complaint: eye drainage Symptoms: eye drainage, red and lid swollen Frequency: x 2 days Pertinent Negatives: Patient denies fever Disposition: [] ED /[] Urgent Care (no appt availability in office) / [] Appointment(In office/virtual)/ [x]  [facility] [place] [facility]/ [] Home Care/ [] Refused Recommended Disposition /[] [facility] [facility]/ []  Follow-up with PCP Additional Notes: pt states that she woke up yesterday with her eye glued shut. State she is current on antibiotics and nasal spray for her sinus. States that discharge is green and her eye lashes were matted together.   Copied from CRM [id number]. Topic: Clinical - Pink Word Triage >> Dec 01, 2023  8:18 AM Dorisann Garre T wrote: Reason for Triage: patient is calling in regarding  her left eye she has green discharge  coming from the eye Reason for Disposition  [1] Eye with yellow or green discharge, or eyelashes stick together AND [2] NO PCP standing order to call in antibiotic eye drops   (Exception: [place]; continue triage.)  Answer Assessment - Initial Assessment Questions 1. EYE DISCHARGE: "Is the discharge in one or both eyes?" "What color is it?" "How much is there?" "When did the discharge start?"      Left - a lot 2. REDNESS OF SCLERA: "Is the redness in one or both eyes?" "When did the redness start?"      This morning,  3. EYELIDS: "Are the eyelids red or swollen?" If Yes, ask: "How much?"      yes 4. VISION: "Is there any difficulty seeing clearly?"      no 5. PAIN: "Is there any pain? If Yes, ask: "How bad is it?" (Scale 1-10; or mild, moderate, severe)    - MILD (1-3): doesn't interfere with normal activities     - MODERATE (4-7): interferes with normal activities or awakens from sleep    - SEVERE (8-10): excruciating pain, unable to do any normal activities       no 6. CONTACT LENS: "Do you wear contacts?"     no 7. OTHER SYMPTOMS: "Do you have any other symptoms?" (e.g., fever, runny nose, cough)     Cough  Protocols  used: Eye - Pus or Discharge-A-AH

## 2023-12-01 NOTE — Progress Notes (Signed)
 Virtual Visit Consent   Samantha Delacruz, you are scheduled for a virtual visit with a [facility] provider today. Just as with appointments in the office, your consent must be obtained to participate. Your consent will be active for this visit and any virtual visit you may have with one of our providers in the next 365 days. If you have a MyChart account, a copy of this consent can be sent to you electronically.  As this is a virtual visit, video technology does not allow for your provider to perform a traditional examination. This may limit your provider's ability to fully assess your condition. If your provider identifies any concerns that need to be evaluated in person or the need to arrange testing (such as labs, EKG, etc.), we will make arrangements to do so. Although advances in technology are sophisticated, we cannot ensure that it will always work on either your end or our end. If the connection with a video visit is poor, the visit may have to be switched to a telephone visit. With either a video or telephone visit, we are not always able to ensure that we have a secure connection.  By engaging in this virtual visit, you consent to the provision of healthcare and authorize for your insurance to be billed (if applicable) for the services provided during this visit. Depending on your insurance coverage, you may receive a charge related to this service.  I need to obtain your verbal consent now. Are you willing to proceed with your visit today? Samantha Delacruz has provided verbal consent on 12/01/2023 for a virtual visit (video or telephone). Angelia Kelp, PA-C  Date: 12/01/2023 9:53 AM   Virtual Visit via Video Note   I, Angelia Kelp, connected with  Samantha Delacruz  ([id number], [birth year]) on 12/01/23 at  9:45 AM EDT by a video-enabled telemedicine application and verified that I am speaking with the correct person using two identifiers.  Location: Patient: Virtual  Visit Location Patient: Home Provider: Virtual Visit Location Provider: Home Office   I discussed the limitations of evaluation and management by telemedicine and the availability of in person appointments. The patient expressed understanding and agreed to proceed.    History of Present Illness: Samantha Delacruz is a 58 y.o. who identifies as a female who was assigned female at birth, and is being seen today for possible pink eye.  HPI: Conjunctivitis  The current episode started yesterday. The onset was sudden. The problem occurs continuously. The problem has been gradually worsening. The problem is mild. Nothing relieves the symptoms. Nothing aggravates the symptoms. Associated symptoms include eye itching, congestion, headaches, rhinorrhea, eye discharge and eye redness. Pertinent negatives include no fever, no decreased vision, no double vision and no eye pain. The eye pain is mild. The left eye is affected. The eye pain is not associated with movement. The eyelid exhibits swelling.    Seen by PCP yesterday for sinus infection and prescribed Flonase , Azelastine  nasal and Augmentin . After using the nasal spray the infection seemed to be pushed up into her left eye and had purulent drainage and crusted shut this morning. Now having swelling and continued drainage of the left eye.  Problems:  Patient Active Problem List   Diagnosis Date Noted   Gastroesophageal reflux disease without esophagitis 11/08/2023   Prediabetes 11/08/2023   HSV (herpes simplex virus) anogenital infection 01/24/2023   Anxiety 11/14/2021   Uterine leiomyoma 11/14/2021   Anemia 11/09/2021   Angioedema 11/09/2021  Urticaria 11/09/2021   Attention deficit hyperactivity disorder 11/09/2021   Cyst of left breast 11/09/2021   Hemorrhoids 11/09/2021   Migraine 11/09/2021   Restless legs 11/09/2021   Rosacea 11/09/2021   Traumatic brain injury (HCC) 11/09/2021   Other adverse food reactions, not elsewhere  classified, subsequent encounter 01/04/2021   Adverse effect of other drugs, medicaments and biological substances, subsequent encounter 01/04/2021   Asthma 01/04/2021   HTN (hypertension) 05/14/2019   Right hip pain 08/21/2014   Palpitations 08/04/2014   Latex allergy 02/05/2014   Rectal bleeding 12/10/2013   Allergic rhinitis 09/26/2013   Benign paroxysmal positional vertigo 06/20/2013   Seborrheic keratosis, inflamed 05/23/2013   Fibrocystic breast changes 10/02/2012   Overweight 10/02/2012    Allergies:  Allergies  Allergen Reactions   Latex Rash   Green Dyes Hives    Green, yellow, blue   Influenza Virus Vaccine Nausea And Vomiting   Blue Dyes (Parenteral) Hives    blue dye   Codeine Hives   Lactose Diarrhea   Lactose Intolerance (Gi) Diarrhea    cramping   Meloxicam Other (See Comments)    meloxicam   Sulfa Antibiotics Hives   Yellow Dye Hives   Medications:  Current Outpatient Medications:    moxifloxacin  (VIGAMOX ) 0.5 % ophthalmic solution, Place 1 drop into the left eye 3 (three) times daily for 5 days., Disp: 3 mL, Rfl: 0   albuterol  (VENTOLIN  HFA) 108 (90 Base) MCG/ACT inhaler, Inhale 2 puffs into the lungs every 6 (six) hours as needed for wheezing or shortness of breath., Disp: 3 each, Rfl: 1   amLODipine  (NORVASC ) 5 MG tablet, Take 1 tablet (5 mg total) by mouth daily., Disp: 90 tablet, Rfl: 1   amoxicillin -clavulanate (AUGMENTIN ) 875-125 MG tablet, Take 1 tablet by mouth 2 (two) times daily after a meal., Disp: 14 tablet, Rfl: 0   azelastine  (ASTELIN ) 0.1 % nasal spray, Place 2 sprays into both nostrils 2 (two) times daily as needed for rhinitis. Use in each nostril as directed (Patient not taking: Reported on 11/30/2023), Disp: 30 mL, Rfl: 4   budesonide -formoterol  (SYMBICORT ) 160-4.5 MCG/ACT inhaler, Inhale 2 puffs into the lungs 2 (two) times daily. (Patient not taking: Reported on 11/30/2023), Disp: 10.2 g, Rfl: 3   EPINEPHrine  (AUVI-Q ) 0.3 mg/0.3 mL IJ SOAJ  injection, Inject 0.3 mg into the muscle as needed for anaphylaxis., Disp: 1 each, Rfl: 1   fluticasone  (FLONASE ) 50 MCG/ACT nasal spray, Place 1 spray into both nostrils daily. (Patient not taking: Reported on 11/30/2023), Disp: 16 g, Rfl: 3   Ginger, Zingiber officinalis, (GINGER PO), Take by mouth., Disp: , Rfl:    OVER THE COUNTER MEDICATION, probiotic, Disp: , Rfl:    OVER THE COUNTER MEDICATION, Naturello multivitamin, Disp: , Rfl:    OVER THE COUNTER MEDICATION, Women garden of life- once daily women, Disp: , Rfl:    pantoprazole  (PROTONIX ) 40 MG tablet, Take 1 tablet (40 mg total) by mouth daily., Disp: 90 tablet, Rfl: 1   TURMERIC PO, Take by mouth., Disp: , Rfl:    valACYclovir  (VALTREX ) 500 MG tablet, Take 1 tablet (500 mg total) by mouth daily., Disp: 90 tablet, Rfl: 3  Observations/Objective: Patient is well-developed, well-nourished in no acute distress.  Resting comfortably at home.  Head is normocephalic, atraumatic.  No labored breathing.  Speech is clear and coherent with logical content.  Patient is alert and oriented at baseline.  Left eyelid is swollen (upper and lower), left eye is injected and red  Assessment and Plan: 1. Bacterial conjunctivitis of left eye (Primary) - moxifloxacin  (VIGAMOX ) 0.5 % ophthalmic solution; Place 1 drop into the left eye 3 (three) times daily for 5 days.  Dispense: 3 mL; Refill: 0  - Suspect bacterial conjunctivitis - Vigamox  prescribed; can use in right eye as well if symptoms develop, just disinfect dropper between uses - Warm compresses - Good hand hygiene - Continue Augmentin  - Hold Flonase  until has been on the antibiotic for 3 days, then can add back - Seek in person evaluation if symptoms worsen or fail to improve   Follow Up Instructions: I discussed the assessment and treatment plan with the patient. The patient was provided an opportunity to ask questions and all were answered. The patient agreed with the plan and  demonstrated an understanding of the instructions.  A copy of instructions were sent to the patient via MyChart unless otherwise noted below.    The patient was advised to call back or seek an in-person evaluation if the symptoms worsen or if the condition fails to improve as anticipated.    Angelia Kelp, PA-C

## 2023-12-01 NOTE — Patient Instructions (Signed)
 Samantha Delacruz, thank you for joining Angelia Kelp, PA-C for today's virtual visit.  While this provider is not your primary care provider (PCP), if your PCP is located in our provider database this encounter information will be shared with them immediately following your visit.   A [facility] MyChart account gives you access to today's visit and all your visits, tests, and labs performed at [facility] " click here if you don't have a [facility] MyChart account or go to [email or web address]  Consent: (Patient) Samantha Delacruz provided verbal consent for this virtual visit at the beginning of the encounter.  Current Medications:  Current Outpatient Medications:    moxifloxacin  (VIGAMOX ) 0.5 % ophthalmic solution, Place 1 drop into the left eye 3 (three) times daily for 5 days., Disp: 3 mL, Rfl: 0   albuterol  (VENTOLIN  HFA) 108 (90 Base) MCG/ACT inhaler, Inhale 2 puffs into the lungs every 6 (six) hours as needed for wheezing or shortness of breath., Disp: 3 each, Rfl: 1   amLODipine  (NORVASC ) 5 MG tablet, Take 1 tablet (5 mg total) by mouth daily., Disp: 90 tablet, Rfl: 1   amoxicillin -clavulanate (AUGMENTIN ) 875-125 MG tablet, Take 1 tablet by mouth 2 (two) times daily after a meal., Disp: 14 tablet, Rfl: 0   azelastine  (ASTELIN ) 0.1 % nasal spray, Place 2 sprays into both nostrils 2 (two) times daily as needed for rhinitis. Use in each nostril as directed (Patient not taking: Reported on 11/30/2023), Disp: 30 mL, Rfl: 4   budesonide -formoterol  (SYMBICORT ) 160-4.5 MCG/ACT inhaler, Inhale 2 puffs into the lungs 2 (two) times daily. (Patient not taking: Reported on 11/30/2023), Disp: 10.2 g, Rfl: 3   EPINEPHrine  (AUVI-Q ) 0.3 mg/0.3 mL IJ SOAJ injection, Inject 0.3 mg into the muscle as needed for anaphylaxis., Disp: 1 each, Rfl: 1   fluticasone  (FLONASE ) 50 MCG/ACT nasal spray, Place 1 spray into both nostrils daily. (Patient not taking: Reported on 11/30/2023),  Disp: 16 g, Rfl: 3   Ginger, Zingiber officinalis, (GINGER PO), Take by mouth., Disp: , Rfl:    OVER THE COUNTER MEDICATION, probiotic, Disp: , Rfl:    OVER THE COUNTER MEDICATION, Naturello multivitamin, Disp: , Rfl:    OVER THE COUNTER MEDICATION, Women garden of life- once daily women, Disp: , Rfl:    pantoprazole  (PROTONIX ) 40 MG tablet, Take 1 tablet (40 mg total) by mouth daily., Disp: 90 tablet, Rfl: 1   TURMERIC PO, Take by mouth., Disp: , Rfl:    valACYclovir  (VALTREX ) 500 MG tablet, Take 1 tablet (500 mg total) by mouth daily., Disp: 90 tablet, Rfl: 3   Medications ordered in this encounter:  Meds ordered this encounter  Medications   moxifloxacin  (VIGAMOX ) 0.5 % ophthalmic solution    Sig: Place 1 drop into the left eye 3 (three) times daily for 5 days.    Dispense:  3 mL    Refill:  0    Supervising Provider:   Corine Dice [[id number]]     *If you need refills on other medications prior to your next appointment, please contact your pharmacy*  Follow-Up: Call back or seek an in-person evaluation if the symptoms worsen or if the condition fails to improve as anticipated.  [facility] [phone]  Other Instructions Bacterial Conjunctivitis, Adult Bacterial conjunctivitis is an infection of the clear membrane that covers the white part of the eye and the inner surface of the eyelid (conjunctiva). When the blood vessels in the conjunctiva become inflamed, the eye  becomes red or pink. The eye often feels irritated or itchy. Bacterial conjunctivitis spreads easily from person to person (is contagious). It also spreads easily from one eye to the other eye. What are the causes? This condition is caused by bacteria. You may get the infection if you come into close contact with: A person who is infected with the bacteria. Items that are contaminated with the bacteria, such as a face towel, contact lens solution, or eye makeup. What increases the risk? You are  more likely to develop this condition if: You are exposed to other people who have the infection. You wear contact lenses. You have a sinus infection. You have had a recent eye injury or surgery. You have a weak body defense system (immune system). You have a medical condition that causes dry eyes. What are the signs or symptoms? Symptoms of this condition include: Thick, yellowish discharge from the eye. This may turn into a crust on the eyelid overnight and cause your eyelids to stick together. Tearing or watery eyes. Itchy eyes. Burning feeling in your eyes. Eye redness. Swollen eyelids. Blurred vision. How is this diagnosed? This condition is diagnosed based on your symptoms and medical history. Your health care provider may also take a sample of discharge from your eye to find the cause of your infection. How is this treated? This condition may be treated with: Antibiotic eye drops or ointment to clear the infection more quickly and prevent the spread of infection to others. Antibiotic medicines taken by mouth (orally) to treat infections that do not respond to drops or ointments or that last longer than 10 days. Cool, wet cloths (cool compresses) placed on the eyes. Artificial tears applied 2-6 times a day. Follow these instructions at home: Medicines Take or apply your antibiotic medicine as told by your health care provider. Do not stop using the antibiotic, even if your condition improves, unless directed by your health care provider. Take or apply over-the-counter and prescription medicines only as told by your health care provider. Be very careful to avoid touching the edge of your eyelid with the eye-drop bottle or the ointment tube when you apply medicines to the affected eye. This will keep you from spreading the infection to your other eye or to other people. Managing discomfort Gently wipe away any drainage from your eye with a warm, wet washcloth or a cotton  ball. Apply a clean, cool compress to your eye for 10-20 minutes, 3-4 times a day. General instructions Do not wear contact lenses until the inflammation is gone and your health care provider says it is safe to wear them again. Ask your health care provider how to sterilize or replace your contact lenses before you use them again. Wear glasses until you can resume wearing contact lenses. Avoid wearing eye makeup until the inflammation is gone. Throw away any old eye cosmetics that may be contaminated. Change or wash your pillowcase every day. Do not share towels or washcloths. This may spread the infection. Wash your hands often with soap and water for at least 20 seconds and especially before touching your face or eyes. Use paper towels to dry your hands. Avoid touching or rubbing your eyes. Do not drive or use heavy machinery if your vision is blurred. Contact a health care provider if: You have a fever. Your symptoms do not get better after 10 days. Get help right away if: You have a fever and your symptoms suddenly get worse. You have severe pain when  you move your eye. You have facial pain, redness, or swelling. You have a sudden loss of vision. Summary Bacterial conjunctivitis is an infection of the clear membrane that covers the white part of the eye and the inner surface of the eyelid (conjunctiva). Bacterial conjunctivitis spreads easily from eye to eye and from person to person (is contagious). Wash your hands often with soap and water for at least 20 seconds and especially before touching your face or eyes. Use paper towels to dry your hands. Take or apply your antibiotic medicine as told by your health care provider. Do not stop using the antibiotic even if your condition improves. Contact a health care provider if you have a fever or if your symptoms do not get better after 10 days. Get help right away if you have a sudden loss of vision. This information is not intended to  replace advice given to you by your health care provider. Make sure you discuss any questions you have with your health care provider. Document Revised: 11/11/2020 Document Reviewed: 11/11/2020 Elsevier Patient Education  2024 [employer]   If you have been instructed to have an in-person evaluation today at a local Urgent Care facility, please use the link below. It will take you to a list of all of our available [facility], including address, phone number and hours of operation. Please do not delay care.  [facility]  If you or a family member do not have a primary care provider, use the link below to schedule a visit and establish care. When you choose a [facility] primary care physician or advanced practice provider, you gain a long-term partner in health. Find a Primary Care Provider  Learn more about [facility]'s in-office and virtual care options: [facility] - Get Care Now

## 2023-12-04 NOTE — Telephone Encounter (Signed)
 Patient had vv on 12/01/23.

## 2023-12-07 ENCOUNTER — Ambulatory Visit: Admitting: Family Medicine

## 2023-12-07 ENCOUNTER — Encounter: Payer: Self-pay | Admitting: Family Medicine

## 2023-12-07 VITALS — BP 108/78 | HR 85 | Temp 98.2°F | Resp 18 | Ht 66.0 in | Wt 181.0 lb

## 2023-12-07 DIAGNOSIS — R200 Anesthesia of skin: Secondary | ICD-10-CM

## 2023-12-07 DIAGNOSIS — J0111 Acute recurrent frontal sinusitis: Secondary | ICD-10-CM

## 2023-12-07 DIAGNOSIS — R600 Localized edema: Secondary | ICD-10-CM

## 2023-12-07 MED ORDER — AMOXICILLIN-POT CLAVULANATE 875-125 MG PO TABS: 1.0000 | ORAL_TABLET | Freq: Two times a day (BID) | ORAL | 0 refills | Status: DC

## 2023-12-07 NOTE — Progress Notes (Signed)
 Subjective:     Patient ID: Samantha Delacruz, female    DOB: 10/31/1965, 58 y.o.   MRN: [id number]  Chief Complaint  Patient presents with   Follow-up    Still having drainage in throat, almost vomited yesterday, almost completed antibiotic, concerned she may still be sick    Numbness    Numbness in right hand, ring finger that started this morning   Leg Swelling    Bilateral leg swelling    HPI Discussed the use of AI scribe software for clinical note transcription with the patient, who gave verbal consent to proceed.  History of Present Illness Samantha Delacruz "Samantha Delacruz" is a 58 year old female who presents with numbness in her right ring finger and concerns about medication and leg pain.  She woke up this morning with numbness in her right ring finger, which has persisted since 7:30 AM. Initially, the numbness affected her middle, ring, and pinky fingers, but now it is localized to the fingertip of the ring finger. No neck pain is noted, and she remains active, including playing pickleball.  She is concerned about her antibiotic medication, Augmentin , which she has been taking for seven days at a dose of 875/125 mg. She reports a 'green' taste when taking the medication and is worried about getting sick again as she was previously very ill. She has one pill left.  She describes experiencing pain and swelling in her thighs, which she attributes to weight gain, as her weight has increased to 180 pounds. She notes tenderness in the back of her thighs and mentions a history of vein issues. She has not been wearing compression stockings due to weight gain and weather conditions. She also reports not taking her blood pressure medication, amlodipine , for a few days while traveling, which she believes may have contributed to her symptoms. However, the pain subsided after resuming the medication.  She mentions darkening of her eyelids, which she finds concerning as it makes her look 'old  and tired.' No shortness of breath or fever is reported. She has been using Flonase , saline drops, and an inhaler, and reports no respiratory symptoms, only sinus issues.    There are no preventive care reminders to display for this patient.  Past Medical History:  Diagnosis Date   Adult ADHD    Anemia    history of   Angio-edema    Asthma    Brain injury (HCC)    Headache    Migraines   Heart palpitations    Hemorrhoids, internal, with bleeding and mucous discharge - Grade 1 02/05/2014   Herpes    Hypertension    Migraines    Pneumonia    PTSD (post-traumatic stress disorder)    has a stalker   Restless leg    Rosacea    Shingles    Urticaria    Vertigo     Past Surgical History:  Procedure Laterality Date   CESAREAN SECTION  08/15/1992   son   COLONOSCOPY     DILATATION & CURETTAGE/HYSTEROSCOPY WITH MYOSURE N/A 04/19/2016   Procedure: DILATATION & CURETTAGE/HYSTEROSCOPY WITH MYOSURE;  Surgeon: Lacretia Piccolo, MD;  Location: [facility];  Service: Gynecology;  Laterality: N/A;  request 7:30am start time   Requests one hour OR time.    HEMORRHOID BANDING     retinal peel left eye surgery Left    retina and cataract   thrush       Current Outpatient Medications:    albuterol  (VENTOLIN   HFA) 108 (90 Base) MCG/ACT inhaler, Inhale 2 puffs into the lungs every 6 (six) hours as needed for wheezing or shortness of breath., Disp: 3 each, Rfl: 1   amLODipine  (NORVASC ) 5 MG tablet, Take 1 tablet (5 mg total) by mouth daily., Disp: 90 tablet, Rfl: 1   azelastine  (ASTELIN ) 0.1 % nasal spray, Place 2 sprays into both nostrils 2 (two) times daily as needed for rhinitis. Use in each nostril as directed, Disp: 30 mL, Rfl: 4   budesonide -formoterol  (SYMBICORT ) 160-4.5 MCG/ACT inhaler, Inhale 2 puffs into the lungs 2 (two) times daily., Disp: 10.2 g, Rfl: 3   EPINEPHrine  (AUVI-Q ) 0.3 mg/0.3 mL IJ SOAJ injection, Inject 0.3 mg into the muscle as needed for anaphylaxis., Disp: 1 each,  Rfl: 1   fluticasone  (FLONASE ) 50 MCG/ACT nasal spray, Place 1 spray into both nostrils daily., Disp: 16 g, Rfl: 3   Ginger, Zingiber officinalis, (GINGER PO), Take by mouth., Disp: , Rfl:    OVER THE COUNTER MEDICATION, Nature's Bounty ULTRA STRENGTH PROBIOTIC 10, Disp: , Rfl:    OVER THE COUNTER MEDICATION, Naturello multivitamin, Disp: , Rfl:    OVER THE COUNTER MEDICATION, Women garden of life- once daily women, Disp: , Rfl:    pantoprazole  (PROTONIX ) 40 MG tablet, Take 1 tablet (40 mg total) by mouth daily., Disp: 90 tablet, Rfl: 1   TURMERIC PO, Take by mouth., Disp: , Rfl:    valACYclovir  (VALTREX ) 500 MG tablet, Take 1 tablet (500 mg total) by mouth daily., Disp: 90 tablet, Rfl: 3   amoxicillin -clavulanate (AUGMENTIN ) 875-125 MG tablet, Take 1 tablet by mouth 2 (two) times daily after a meal., Disp: 6 tablet, Rfl: 0  Allergies  Allergen Reactions   Latex Rash and Dermatitis   Green Dyes Hives    Green, yellow, blue   Influenza Virus Vaccine Nausea And Vomiting   Blue Dyes (Parenteral) Hives    blue dye   Codeine Hives   Lactose Diarrhea   Lactose Intolerance (Gi) Diarrhea    cramping   Meloxicam Other (See Comments)    meloxicam   Sulfa Antibiotics Hives   Yellow Dye Hives   ROS neg/noncontributory except as noted HPI/below      Objective:     BP 108/78   Pulse 85   Temp 98.2 F (36.8 C) (Temporal)   Resp 18   Ht 5\' 6"  (1.676 m)   Wt 181 lb (82.1 kg)   LMP 05/15/2017   SpO2 95%   BMI 29.21 kg/m  Wt Readings from Last 3 Encounters:  12/07/23 181 lb (82.1 kg)  11/30/23 178 lb 4 oz (80.9 kg)  11/08/23 182 lb 4 oz (82.7 kg)    Physical Exam   Gen: WDWN NAD HEENT: NCAT, conjunctiva not injected, sclera nonicteric TM WNL B, OP moist, no exudates  NECK:  supple, no thyromegaly, no nodes, no carotid bruits CARDIAC: RRR, S1S2+, no murmur.  LUNGS: CTAB. No wheezes ABDOMEN:  BS+, soft, NTND, No HSM, no masses EXT:  no edema.  Few vericose veins above  knee-medial R thigh MSK: no gross abnormalities.  NEURO: A&O x3.  CN II-XII intact.  PSYCH: normal mood. Good eye contact Fingers R-no ttp.  Neg Tinels.  No swelling.       Assessment & Plan:  Acute recurrent frontal sinusitis -     Amoxicillin -Pot Clavulanate; Take 1 tablet by mouth 2 (two) times daily after a meal.  Dispense: 6 tablet; Refill: 0  Localized edema  Numbness of finger  Assessment and Plan Assessment & Plan Sinus infection   Persistent sinus infection with ongoing drainage and productive cough with green sputum. Completed 7 days of Augmentin  with no improvement, indicating the need for extended antibiotic therapy. Continue Augmentin  for an additional 3 days to complete a 10-day course.  Thigh pain and swelling   Thigh pain and swelling with tenderness in the posterior thighs. Symptoms improved with resumption of amlodipine , suggesting possible fluid retention. Differential includes spine-related issues or venous problems. Consider stretching exercises for hamstrings. Evaluate further if symptoms persist.  Chronic venous insufficiency   Chronic venous insufficiency in legs with previous recommendation for compression stockings and potential procedure. Non-compliance with compression stockings due to weight gain and weather conditions. Consider wearing compression stockings or elevating legs to manage symptoms.  Numbness in right ring finger   Numbness in the right ring finger, particularly the fingertip, possibly due to nerve compression from sleeping position or cervical issues. No signs of cerebrovascular accident. Symptoms may take time to resolve. Consider using a wrist splint at night to prevent wrist flexion. Perform stretching exercises for hand, wrist, and neck several times daily. Consider Aleve for inflammation if needed.  Dark eyelids   Darkening of eyelids, possibly related to allergies or mechanical irritation from rubbing. No specific treatment discussed.     Return for as sch in May.  Ellsworth Haas, MD

## 2023-12-07 NOTE — Patient Instructions (Addendum)
 Get wrist splint for night.   Stretch hand/wrist/neck several times/day  Monitor for now.   Can take aleve if needed for inflammation.   Do 3 more days of antibiotics

## 2023-12-15 ENCOUNTER — Ambulatory Visit: Payer: Self-pay

## 2023-12-15 NOTE — Telephone Encounter (Signed)
 Copied from CRM [id number]. Topic: Clinical - Red Word Triage >> Dec 15, 2023  4:46 PM Orien Bird wrote: [facility] that prompted transfer to Nurse Triage: Patient called stating she is constantly taking antibiotics and the problem is not being resolved. She's thinking it might be allergies and she stated she is allergic to her cats but nothing is helping. Patient states her her throat keeps swelling and she does not know what to do.   Chief Complaint: Sinus infection  Symptoms: Inus congestion, sore throat, nasal discharge  Frequency: Frequent  Pertinent Negatives: Patient denies fever Disposition: [] ED /[] Urgent Care (no appt availability in office) / [x] Appointment(In office/virtual)/ []  [place] [facility]/ [] Home Care/ [x] Refused Recommended Disposition /[] [place] [facility]/ []  Follow-up with PCP Additional Notes: Patient reports she was recently seen for a sinus infection and treated with Augmentin . She states that she finished the course of antibiotics but is still experiencing symptoms. She states that her symptoms have mildly improved since her last visit but have not resolved. Patient would like to see if Dr. Waldo Guitar had any further advice or treatment she would like the patient to try. Patient declined an appointment at this time due to appointment availability and her job. Please advise.    Reason for Disposition  [1] Taking antibiotic > 7 days AND [2] nasal discharge not improved  Answer Assessment - Initial Assessment Questions 1. ANTIBIOTIC: "What antibiotic are you taking?" "How many times a day?"     Augmentin   2. ONSET: "When was the antibiotic started?"     12/08/23 3. PAIN: "How bad is the sinus pain?"   (Scale 1-10; mild, moderate or severe)   - MILD (1-3): doesn't interfere with normal activities    - MODERATE (4-7): interferes with normal activities (e.g., work or school) or awakens from sleep   - SEVERE (8-10): excruciating pain and patient unable to do any normal  activities        Mild to moderate  4. FEVER: "Do you have a fever?" If Yes, ask: "What is it, how was it measured, and when did it start?"      No  Protocols used: Sinus Infection on Antibiotic Follow-up Call-A-AH

## 2023-12-18 ENCOUNTER — Telehealth: Payer: Self-pay | Admitting: *Deleted

## 2023-12-18 ENCOUNTER — Ambulatory Visit (INDEPENDENT_AMBULATORY_CARE_PROVIDER_SITE_OTHER): Admitting: Family Medicine

## 2023-12-18 ENCOUNTER — Ambulatory Visit (HOSPITAL_COMMUNITY)
Admission: RE | Admit: 2023-12-18 | Discharge: 2023-12-18 | Disposition: A | Discharge status: Home / Self Care | Source: Ambulatory Visit | Attending: Family Medicine | Admitting: Family Medicine

## 2023-12-18 ENCOUNTER — Encounter: Payer: Self-pay | Admitting: Family Medicine

## 2023-12-18 ENCOUNTER — Ambulatory Visit: Payer: Self-pay

## 2023-12-18 ENCOUNTER — Telehealth: Payer: Self-pay | Admitting: Family Medicine

## 2023-12-18 VITALS — BP 130/88 | HR 100 | Temp 97.9°F | Ht 66.0 in | Wt 179.0 lb

## 2023-12-18 DIAGNOSIS — R350 Frequency of micturition: Secondary | ICD-10-CM | POA: Diagnosis not present

## 2023-12-18 DIAGNOSIS — I10 Essential (primary) hypertension: Secondary | ICD-10-CM

## 2023-12-18 DIAGNOSIS — R319 Hematuria, unspecified: Secondary | ICD-10-CM

## 2023-12-18 DIAGNOSIS — R109 Unspecified abdominal pain: Secondary | ICD-10-CM | POA: Diagnosis not present

## 2023-12-18 LAB — URINALYSIS, ROUTINE W REFLEX MICROSCOPIC
Bilirubin Urine: NEGATIVE
Ketones, ur: NEGATIVE
Leukocytes,Ua: NEGATIVE
Nitrite: NEGATIVE
Specific Gravity, Urine: <=1.005 — AB (ref 1.000–1.030)
Total Protein, Urine: NEGATIVE
Urine Glucose: NEGATIVE
Urobilinogen, UA: 0.2 (ref 0.0–1.0)
WBC, UA: NONE SEEN (ref 0–?)
pH: 7 (ref 5.0–8.0)

## 2023-12-18 LAB — POCT URINALYSIS DIPSTICK
Bilirubin, UA: NEGATIVE
Glucose, UA: NEGATIVE
Ketones, UA: NEGATIVE
Leukocytes, UA: NEGATIVE
Nitrite, UA: NEGATIVE
Protein, UA: NEGATIVE
Spec Grav, UA: <=1.005 — AB (ref 1.010–1.025)
Urobilinogen, UA: 0.2 U/dL
pH, UA: 7 (ref 5.0–8.0)

## 2023-12-18 NOTE — Telephone Encounter (Signed)
 FYI

## 2023-12-18 NOTE — Telephone Encounter (Signed)
Appt 5/14

## 2023-12-18 NOTE — Telephone Encounter (Signed)
  Chief Complaint: flank pain Symptoms: urinary frequency, bilateral flank pain Frequency: x 6 days; intermittent Pertinent Negatives: Patient denies nausea, vomiting, fever, cloudy urine, burning with urination, blood in urine. Disposition: [] ED /[] Urgent Care (no appt availability in office) / [x] Appointment(In office/virtual)/ []  [place] [facility]/ [] Home Care/ [] Refused Recommended Disposition /[] [place] [facility]/ []  Follow-up with PCP Additional Notes: Patient with recent sinus infection on and off antibiotics, steroids recently. She states her sinus infection has improved since over the weekend. Patient concerned it could be kidney stones, kidney infection, UTI or kidney damage from not taking her BP meds. Patient states she took off work today and would like to be seen today. No available appts with PCP today, patient scheduled with Dr Daneil Dunker.  Copied from CRM [id number]. Topic: Clinical - Red Word Triage >> Dec 18, 2023  8:09 AM Samantha Delacruz wrote: Red Word that prompted transfer to Nurse Triage: Pt having extreame kindey pain , dosent know of having kidnet stones  , Reason for Disposition  MODERATE pain (Delacruz.g., interferes with normal activities or awakens from sleep)  Answer Assessment - Initial Assessment Questions 1. LOCATION: "Where does it hurt?" (Delacruz.g., left, right)     Both sides. Worse on right side.  2. ONSET: "When did the pain start?"     12/13/23.  3. SEVERITY: "How bad is the pain?" (Delacruz.g., Scale 1-10; mild, moderate, or severe)   - MILD (1-3): doesn't interfere with normal activities    - MODERATE (4-7): interferes with normal activities or awakens from sleep    - SEVERE (8-10): excruciating pain and patient unable to do normal activities (stays in bed)       7/10.  4. PATTERN: "Does the pain come and go, or is it constant?"      Comes and goes. Aggravated when she drank caffeine.  5. CAUSE: "What do you think is causing the pain?"     Patient states she has  been drinking protein shakes and she is not sure if that has damaged her kidneys. She states she also has been off her BP meds for a few days. Unsure if it is a kidney stone.  6. OTHER SYMPTOMS:  "Do you have any other symptoms?" (Delacruz.g., fever, abdomen pain, vomiting, leg weakness, burning with urination, blood in urine)     Urinary frequency.  7. PREGNANCY:  "Is there any chance you are pregnant?" "When was your last menstrual period?"     N/A.  Protocols used: Flank Pain-A-AH

## 2023-12-18 NOTE — Progress Notes (Signed)
   Samantha Delacruz is a 58 y.o. female who presents today for an office visit.  Assessment/Plan:  New/Acute Problems: Urinary Frequency/hematuria flank pain Point-of-care urinalysis with positive hemoglobin.  Nitrites and leuk esterase negative.  We will check CT scan to rule out nephrolithiasis.  Urine culture is pending though overall low suspicion for UTI.  Overall her exam today is reassuring.  No signs of systemic infection.  We encouraged hydration.  Depending on results of above workup may need referral to urology.  We discussed reasons to return to care and seek emergent care.  Chronic Problems Addressed Today: Hypertension Blood pressure at goal today on amlodipine  5 mg daily-do not think that this is contributing to her above issues.  Reassured patient.    Subjective:  HPI:  See assessment / plan for status of chronic conditions. She is here today with increased urinary frequency and flank pain. This started about 5 days ago.  She is worried about a kidney stone though has never had this in the past. She has been drinking more protein and is concerned that this may have exacerbated this. No blood in urine. No dysuria. She has had a little more urgency episodes  She has also been dealing with a sinus infection the last few weeks and was started Augmentin  by her PCP a couple of weeks ago. This has improved her sinus symptoms.        Objective:  Physical Exam: BP 130/88   Pulse 100   Temp 97.9 F (36.6 C) (Temporal)   Ht 5\' 6"  (1.676 m)   Wt 179 lb (81.2 kg)   LMP 05/15/2017   SpO2 99%   BMI 28.89 kg/m   Gen: No acute distress, resting comfortably CV: Regular rate and rhythm with no murmurs appreciated Pulm: Normal work of breathing, clear to auscultation bilaterally with no crackles, wheezes, or rhonchi MUSCULOSKELETAL bilateral tenderness along upper lumbar paraspinal muscles. Neuro: Grossly normal, moves all extremities Psych: Normal affect and thought  content  Time Spent: 30 minutes of total time was spent on the date of the encounter performing the following actions: chart review prior to seeing the patient, obtaining history, performing a medically necessary exam, counseling on the treatment plan, placing orders, and documenting in our EHR.        Jinny Mounts. Daneil Dunker, MD 12/18/2023 11:05 AM

## 2023-12-18 NOTE — Telephone Encounter (Signed)
 Copied from CRM [id number]. Topic: Clinical - Red Word Triage >> Dec 18, 2023  8:09 AM Concetta Dee E wrote: Red Word that prompted transfer to Nurse Triage: Pt having extreame kindey pain , dosent know of having kidnet stones  ,  Patient has been scheduled with Dr.Parker on 12/18/23.

## 2023-12-18 NOTE — Patient Instructions (Signed)
 It was very nice to see you today!  You have a small amount of blood in your urine.  We will check a CT scan to make sure that she did not have a stone.  We will also check and make sure that you do not have a urine infection.  We will contact you once we get results back so please let us  know if you develop any worsening symptoms.  Return if symptoms worsen or fail to improve.   Take care, Dr Daneil Dunker  PLEASE NOTE:  If you had any lab tests, please let us  know if you have not heard back within a few days. You may see your results on mychart before we have a chance to review them but we will give you a call once they are reviewed by us .   If we ordered any referrals today, please let us  know if you have not heard from their office within the next week.   If you had any urgent prescriptions sent in today, please check with the pharmacy within an hour of our visit to make sure the prescription was transmitted appropriately.   Please try these tips to maintain a healthy lifestyle:  Eat at least 3 REAL meals and 1-2 snacks per day.  Aim for no more than 5 hours between eating.  If you eat breakfast, please do so within one hour of getting up.   Each meal should contain half fruits/vegetables, one quarter protein, and one quarter carbs (no bigger than a computer mouse)  Cut down on sweet beverages. This includes juice, soda, and sweet tea.   Drink at least 1 glass of water with each meal and aim for at least 8 glasses per day  Exercise at least 150 minutes every week.

## 2023-12-18 NOTE — Telephone Encounter (Unsigned)
 Copied from CRM [id number]. Topic: Clinical - Red Word Triage >> Dec 18, 2023  8:09 AM Concetta Dee E wrote: Red Word that prompted transfer to Nurse Triage: Pt having extreame kindey pain , dosent know of having kidnet stones  ,

## 2023-12-19 ENCOUNTER — Ambulatory Visit: Payer: Self-pay | Admitting: Family Medicine

## 2023-12-19 ENCOUNTER — Telehealth: Payer: Self-pay | Admitting: *Deleted

## 2023-12-19 ENCOUNTER — Other Ambulatory Visit: Payer: Self-pay

## 2023-12-19 ENCOUNTER — Encounter: Payer: Self-pay | Admitting: Family Medicine

## 2023-12-19 VITALS — BP 126/88 | HR 91 | Temp 98.1°F | Resp 20 | Wt 181.1 lb

## 2023-12-19 DIAGNOSIS — J3089 Other allergic rhinitis: Secondary | ICD-10-CM | POA: Diagnosis not present

## 2023-12-19 DIAGNOSIS — Z91040 Latex allergy status: Secondary | ICD-10-CM

## 2023-12-19 DIAGNOSIS — B999 Unspecified infectious disease: Secondary | ICD-10-CM | POA: Diagnosis not present

## 2023-12-19 DIAGNOSIS — T7800XD Anaphylactic reaction due to unspecified food, subsequent encounter: Secondary | ICD-10-CM

## 2023-12-19 DIAGNOSIS — T7800XA Anaphylactic reaction due to unspecified food, initial encounter: Secondary | ICD-10-CM

## 2023-12-19 DIAGNOSIS — J302 Other seasonal allergic rhinitis: Secondary | ICD-10-CM

## 2023-12-19 DIAGNOSIS — J4540 Moderate persistent asthma, uncomplicated: Secondary | ICD-10-CM | POA: Diagnosis not present

## 2023-12-19 LAB — URINE CULTURE
MICRO NUMBER:: 16413032
Result:: NO GROWTH
SPECIMEN QUALITY:: ADEQUATE

## 2023-12-19 NOTE — Telephone Encounter (Signed)
 Copied from CRM [id number]. Topic: Clinical - Lab/Test Results >> Dec 19, 2023  3:06 PM Samantha Delacruz wrote: Reason for CRM: Patient called in regarding urine results from yesterday.I informed patient that once provider reviews them she will be in contact to discuss.

## 2023-12-19 NOTE — Progress Notes (Signed)
 Her CT scan was negative for kidney stone.  We are still waiting on her urine culture to come back and we will contact her once we get these results back.  Did not need to make any changes to her treatment plan at this time though she should let us  know if she has had any change in symptoms.

## 2023-12-19 NOTE — Progress Notes (Unsigned)
 [address] [place] [address] Dept: [phone]  FOLLOW UP NOTE  Patient ID: Samantha Delacruz, female    DOB: Aug 23, 1965  Age: 58 y.o. MRN: [id number] Date of Office Visit: 12/19/2023  Assessment  Chief Complaint: Follow-up, Nasal Congestion, and Sinus Problem (Nasal congestion and sinus issues, ready to discus allergy shots)  HPI Samantha Delacruz is a 58 year old female who presents to the clinic for a follow up visit. She was last seen in this clinic on 01/04/2021 by Dr. Burdette Carolin for evaluation of food allergy, allergic rhinitis, asthma, and possible latex allergy.   At today's visit, she reports her asthma has been well controlled with no shortness of breath, cough, or wheeze with activity or rest. She continues Symbicort  160-2 puffs twice a day with a spacer and only occasionally uses albuterol  for relief of symptoms.   Allergic rhinitis is reported as poorly controlled with symptoms including rhinorrhea, nasal congestion, occasional sneezing, and copious post nasal drainage with frequent throat clearing. She continues Flonase  and saline nasal rinses. She rarely takes an antihistamine as these make her sleepy. She reports that she has a cat in her house who frequently sleeps with her. Her last environmental allergy testing was from an outside source and reported to be positive to cat, dust mite, and feather. She is interested in updating her allergy testing and likely beginning allergy immunotherapy.   She reports at least 5 sinus infections requiring antibiotics or steroids over the last year.   She continues to avoid food dyes and latex with no recent exposure. Her last lab results from 01/12/2021 include negative results to arnotto seed, red dye, and latex.   Her current medications are listed in the chart.  Drug Allergies:  Allergies  Allergen Reactions   Latex Rash and Dermatitis   Green Dyes Hives    Green, yellow, blue   Influenza Virus Vaccine Nausea And Vomiting    Blue Dyes (Parenteral) Hives    blue dye   Codeine Hives   Lactose Diarrhea   Lactose Intolerance (Gi) Diarrhea    cramping   Meloxicam Other (See Comments)    meloxicam   Sulfa Antibiotics Hives   Yellow Dye Hives    Physical Exam: BP 126/88   Pulse 91   Temp 98.1 F (36.7 C) (Temporal)   Resp 20   Wt 181 lb 1.9 oz (82.2 kg)   LMP 05/15/2017   SpO2 95%   BMI 29.23 kg/m    Physical Exam Vitals reviewed.  Constitutional:      Appearance: Normal appearance.  HENT:     Head: Normocephalic and atraumatic.     Right Ear: Tympanic membrane normal.     Left Ear: Tympanic membrane normal.     Nose:     Comments: Bilateral nares edematous with thick clear nasal congestion. Pharynx slightly erythematous with no exudate. Ears normal. Eyes normal. Eyes:     Conjunctiva/sclera: Conjunctivae normal.  Cardiovascular:     Rate and Rhythm: Normal rate and regular rhythm.     Heart sounds: No murmur heard. Pulmonary:     Effort: Pulmonary effort is normal.     Breath sounds: Normal breath sounds.     Comments: Lungs clear to auscultation Musculoskeletal:        General: Normal range of motion.     Cervical back: Normal range of motion and neck supple.  Skin:    General: Skin is warm and dry.  Neurological:  Mental Status: She is alert and oriented to person, place, and time.  Psychiatric:        Mood and Affect: Mood normal.        Behavior: Behavior normal.        Thought Content: Thought content normal.        Judgment: Judgment normal.     Assessment and Plan: 1. Recurrent infections   2. Moderate persistent asthma without complication   3. Seasonal and perennial allergic rhinitis   4. Allergy with anaphylaxis due to food   5. Latex allergy     Patient Instructions  Asthma Continue albuterol  2 puffs once every 4 hours if needed for coguh or wheeze Continue Symbicort  160-2 puffs twice a day with a spacer to prevent cough or wheeze Get spirometry before  environmental allergy skin testing at next visit  Allergic rhinitis Continue allergen avoidance measures directed toward dust mite, cat, and feathers as listed below Continue an antihistamine once a day if needed for runny nose or itch Continue Flonase  2 sprays in each nostril once a day if needed for stuffy nose Consider nasal saline rinses. Return to the clinic to update your environmental allergy skin testing.  Remember to stop any antihistamines for 3 days before your testing appointment  Food allergy Continue to avoid food dyes.  In case of an allergic reaction, take Benadryl  50 mg every 4 hours, and if life-threatening symptoms occur, inject with EpiPen  0.3 mg.  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  Continue to avoid products with latex  Recurrent sinusitis Lab work has been entered to help us  evaluate your immune system.  We will call you when the results become available. Keep track of infection, antibiotic, and steroid use  Call the clinic if this treatment plan is not working well for you  Follow up in 3 months or sooner if needed.   Return in about 3 months (around 03/20/2024), or if symptoms worsen or fail to improve.    Thank you for the opportunity to care for this patient.  Please do not hesitate to contact me with questions.  Marinus Sic, FNP [facility] 

## 2023-12-19 NOTE — Patient Instructions (Addendum)
 Asthma Continue albuterol  2 puffs once every 4 hours if needed for coguh or wheeze Continue Symbicort  160-2 puffs twice a day with a spacer to prevent cough or wheeze Get spirometry before environmental allergy skin testing at next visit  Allergic rhinitis Continue allergen avoidance measures directed toward dust mite, cat, and feathers as listed below Continue an antihistamine once a day if needed for runny nose or itch Continue Flonase  2 sprays in each nostril once a day if needed for stuffy nose Consider nasal saline rinses. Return to the clinic to update your environmental allergy skin testing.  Remember to stop any antihistamines for 3 days before your testing appointment  Food allergy Continue to avoid food dyes.  In case of an allergic reaction, take Benadryl  50 mg every 4 hours, and if life-threatening symptoms occur, inject with EpiPen  0.3 mg.  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  Continue to avoid products with latex  Recurrent sinusitis Lab work has been entered to help us  evaluate your immune system.  We will call you when the results become available. Keep track of infection, antibiotic, and steroid use  Call the clinic if this treatment plan is not working well for you  Follow up in 3 months or sooner if needed.  Control of Dust Mite Allergen Dust mites play a major role in allergic asthma and rhinitis. They occur in environments with high humidity wherever human skin is found. Dust mites absorb humidity from the atmosphere (ie, they do not drink) and feed on organic matter (including shed human and animal skin). Dust mites are a microscopic type of insect that you cannot see with the naked eye. High levels of dust mites have been detected from mattresses, pillows, carpets, upholstered furniture, bed covers, clothes, soft toys and any woven material. The principal allergen of the dust mite is found in its feces. A gram of dust may contain 1,000 mites and 250,000 fecal  particles. Mite antigen is easily measured in the air during house cleaning activities. Dust mites do not bite and do not cause harm to humans, other than by triggering allergies/asthma.  Ways to decrease your exposure to dust mites in your home:  1. Encase mattresses, box springs and pillows with a mite-impermeable barrier or cover  2. Wash sheets, blankets and drapes weekly in hot water (130 F) with detergent and dry them in a dryer on the hot setting.  3. Have the room cleaned frequently with a vacuum cleaner and a damp dust-mop. For carpeting or rugs, vacuuming with a vacuum cleaner equipped with a high-efficiency particulate air (HEPA) filter. The dust mite allergic individual should not be in a room which is being cleaned and should wait 1 hour after cleaning before going into the room.  4. Do not sleep on upholstered furniture (eg, couches).  5. If possible removing carpeting, upholstered furniture and drapery from the home is ideal. Horizontal blinds should be eliminated in the rooms where the person spends the most time (bedroom, study, television room). Washable vinyl, roller-type shades are optimal.  6. Remove all non-washable stuffed toys from the bedroom. Wash stuffed toys weekly like sheets and blankets above.  7. Reduce indoor humidity to less than 50%. Inexpensive humidity monitors can be purchased at most hardware stores. Do not use a humidifier as can make the problem worse and are not recommended.  Control of Dog or Cat Allergen Avoidance is the best way to manage a dog or cat allergy. If you have a dog or cat and  are allergic to dog or cats, consider removing the dog or cat from the home. If you have a dog or cat but don't want to find it a new home, or if your family wants a pet even though someone in the household is allergic, here are some strategies that may help keep symptoms at bay:  Keep the pet out of your bedroom and restrict it to only a few rooms. Be advised that  keeping the dog or cat in only one room will not limit the allergens to that room. Don't pet, hug or kiss the dog or cat; if you do, wash your hands with soap and water. High-efficiency particulate air (HEPA) cleaners run continuously in a bedroom or living room can reduce allergen levels over time. Regular use of a high-efficiency vacuum cleaner or a central vacuum can reduce allergen levels. Giving your dog or cat a bath at least once a week can reduce airborne allergen.

## 2023-12-19 NOTE — Telephone Encounter (Signed)
 Copied from CRM [id number]. Topic: Clinical - Request for Lab/Test Order >> Dec 18, 2023  4:59 PM Star [place] wrote: Reason for CRM: Lea with [facility]- fax [phone]- try this fax to send signed order Order faxed to [phone]. QSJ

## 2023-12-20 ENCOUNTER — Encounter: Payer: Self-pay | Admitting: Family Medicine

## 2023-12-20 ENCOUNTER — Encounter (HOSPITAL_COMMUNITY): Payer: Self-pay

## 2023-12-20 DIAGNOSIS — T7800XA Anaphylactic reaction due to unspecified food, initial encounter: Secondary | ICD-10-CM | POA: Insufficient documentation

## 2023-12-20 DIAGNOSIS — J302 Other seasonal allergic rhinitis: Secondary | ICD-10-CM | POA: Insufficient documentation

## 2023-12-20 DIAGNOSIS — B999 Unspecified infectious disease: Secondary | ICD-10-CM

## 2023-12-20 HISTORY — DX: Unspecified infectious disease: B99.9

## 2023-12-20 NOTE — Progress Notes (Signed)
 Urine culture is negative.  She does not have a UTI.  Recommend referral to urology if she is still having symptoms.

## 2023-12-22 ENCOUNTER — Other Ambulatory Visit

## 2023-12-22 ENCOUNTER — Ambulatory Visit (INDEPENDENT_AMBULATORY_CARE_PROVIDER_SITE_OTHER)

## 2023-12-22 ENCOUNTER — Encounter: Payer: Self-pay | Admitting: Internal Medicine

## 2023-12-22 ENCOUNTER — Ambulatory Visit (INDEPENDENT_AMBULATORY_CARE_PROVIDER_SITE_OTHER): Admitting: Internal Medicine

## 2023-12-22 VITALS — BP 136/84 | HR 83 | Temp 98.6°F | Ht 66.0 in | Wt 178.8 lb

## 2023-12-22 DIAGNOSIS — J328 Other chronic sinusitis: Secondary | ICD-10-CM | POA: Diagnosis not present

## 2023-12-22 DIAGNOSIS — M5440 Lumbago with sciatica, unspecified side: Secondary | ICD-10-CM

## 2023-12-22 DIAGNOSIS — R202 Paresthesia of skin: Secondary | ICD-10-CM

## 2023-12-22 DIAGNOSIS — E669 Obesity, unspecified: Secondary | ICD-10-CM

## 2023-12-22 DIAGNOSIS — Z6828 Body mass index (BMI) 28.0-28.9, adult: Secondary | ICD-10-CM

## 2023-12-22 DIAGNOSIS — R32 Unspecified urinary incontinence: Secondary | ICD-10-CM

## 2023-12-22 MED ORDER — CELECOXIB 100 MG PO CAPS: 100.0000 mg | ORAL_CAPSULE | Freq: Two times a day (BID) | ORAL | 1 refills | Status: AC

## 2023-12-22 MED ORDER — KETOROLAC TROMETHAMINE 60 MG/2ML IM SOLN
60.0000 mg | Freq: Once | INTRAMUSCULAR | Status: AC
  Administered 2023-12-22: 60 mg via INTRAMUSCULAR

## 2023-12-22 MED ORDER — CYCLOBENZAPRINE HCL 10 MG PO TABS: 10.0000 mg | ORAL_TABLET | Freq: Three times a day (TID) | ORAL | 0 refills | Status: DC | PRN

## 2023-12-22 MED ORDER — GABAPENTIN 300 MG PO CAPS: 300.0000 mg | ORAL_CAPSULE | Freq: Three times a day (TID) | ORAL | 3 refills | Status: AC

## 2023-12-22 NOTE — Patient Instructions (Addendum)
 VISIT SUMMARY You were seen today for acute worsening of chronic right?sided low back pain, now with "labor-like" muscle spasms and recent episodes of urinary incontinence. You also noted intermittent numbness in your right ring finger and ongoing chronic sinus symptoms. Your history of scoliosis and a CT scan showing collapsed disc space at L2-3 guided our evaluation.  YOUR PLAN 1. 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 Flexion/extension lumbar X-rays to quantify spinal instability. Referral: Dr. Garry [place], spinal surgeon, for surgical evaluation. Activity: Strictly avoid forward bending, twisting or heavy lifting until cleared. Sleep support: Use a memory-foam mattress topper or firm mattress with neutral spine alignment. 2. Pain Management Toradol  IM administered today - you must eat a light snack or meal before leaving to reduce stomach irritation. Celebrex 200 mg once daily with food; you may substitute ibuprofen 600 mg TID as needed if preferred. Muscle relaxer (e.g., cyclobenzaprine ) and gabapentin  for spasms/nerve pain as tolerated. Consider PT with spinal-alignment focus, dry-needling or acupuncture for muscle spasm relief. 3. Right Ring-Finger Paresthesia Likely positional nerve irritation--no vascular compromise on exam. Monitor for changes; avoid prolonged wrist flexion or pressure. 4. Chronic Sinusitis Continue your current ENT plan; this is not driving your back symptoms.  NEXT STEPS & INSTRUCTIONS Schedule your MRI and flexion/extension X-rays immediately. Follow up with Dr. Adin Aguas office for surgical consultation. Eat a snack or meal before leaving today's visit (Toradol  irritates an empty stomach). Limit bending/twisting motions; use memory-foam support in bed. Call our office right away if you develop  worsening numbness, new bowel/bladder changes, fever, or unrelenting pain. Feel free to reach out with any questions or concerns. We'll review your imaging and surgeon's recommendations as soon as they're available.

## 2023-12-22 NOTE — Progress Notes (Signed)
 ==============================  [facility]: [phone]   -- Medical Office Visit --  Patient: Samantha Delacruz      Age: 58 y.o.       Sex:  female  Date:   12/22/2023 Today's Healthcare Provider: Bernardino KANDICE Cone, MD  ==============================   Chief Complaint: pain on right side (Has had pain since last Friday right side pain.) and Back Pain (Having back pain doing excise but believes that it came from that or bending a lot from field day. )  Discussed the use of AI scribe software for clinical note transcription with the patient, who gave verbal consent to proceed.  History of Present Illness 58 year old female with scoliosis who presents with severe back pain.  She experiences severe back pain primarily on the right flank, described as similar to labor pain. The pain is intermittent, coming in waves, and is sometimes alleviated by standing at an angle. It worsened after participating in physical activities, including aerobics, Pilates, and a field day event involving repetitive bending exercises. She uses Tylenol 500 mg for pain relief, but it provides limited relief. A CT scan last week was negative for kidney stones and urinary tract infection. No radiation to the groin or associated rash.  She has a history of scoliosis identified in elementary school, which was not treated. She reports a longstanding issue with hematuria, which has been investigated previously without a definitive source being identified. No personal history of cancer.  She mentions a history of ongoing sinus infections for several months, which have impacted her ability to exercise regularly. She has experienced numbness in her right ring finger, which she attributes to laying on her wrist awkwardly, and this symptom has been intermittent.  She has experienced recent episodes of incontinence, which she relates to her back issues. She previously underwent pelvic floor  therapy, which was beneficial. 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  -58 year old F with acute worsening of chronic low back pain, describing labor-like waves of right flank/low-back discomfort. - Triggered by repetitive bending exercise (trash-can exercise) two days ago. - Prior CT (kidney-stone protocol) ruled out stones/UTI but showed nothing else on the initial read. - Reports transient episodes of urinary incontinence over the weekend (new for her). - History of childhood shingles; occasional right ring-finger paresthesia (likely unrelated). - Tried Tylenol 500 mg, Advil 600 mg with only partial relief.  Background Reviewed: Problem List: has Fibrocystic breast changes; Overweight; Seborrheic keratosis, inflamed; Benign paroxysmal positional vertigo; Allergic rhinitis; Rectal bleeding; Latex allergy ; Palpitations; Right hip pain; HTN (hypertension); Other adverse food reactions, not elsewhere classified, subsequent encounter; Adverse effect of other drugs, medicaments and biological substances, subsequent encounter; Asthma; Anemia; Angioedema; Anxiety; Urticaria; Attention deficit hyperactivity disorder; Cyst of left breast; Hemorrhoids; Migraine; Restless legs; Rosacea; Traumatic brain injury (HCC); Uterine leiomyoma; HSV (herpes simplex virus) anogenital infection; Gastroesophageal reflux disease without esophagitis; Prediabetes; Recurrent infections; Seasonal and perennial allergic rhinitis; and Allergy  with anaphylaxis due to food on their problem list. Past Medical History:  has a past medical history of Adult ADHD, Anemia, Angio-edema, Asthma, Brain injury (HCC), Headache, Heart palpitations, Hemorrhoids, internal, with bleeding and mucous discharge - Grade 1 (02/05/2014), Herpes, Hypertension, Migraines, Pneumonia, PTSD (post-traumatic stress  disorder), Recurrent infections (12/20/2023), Restless leg, Rosacea, Shingles, Urticaria, and Vertigo. Past Surgical History:   has a past surgical history that  includes Cesarean section (08/15/1992); Dilatation & curettage/hysteroscopy with myosure (N/A, 04/19/2016); thrush; retinal peel left eye surgery (Left); Colonoscopy; and Hemorrhoid banding. Social History:   reports that she has never smoked. She has never used smokeless tobacco. She reports that she does not currently use alcohol. She reports that she does not use drugs. Family History:  family history includes ADD / ADHD in her son; Breast cancer in her mother; Depression in her mother; Diabetes in her mother; Emphysema in her mother; Esophageal cancer in her paternal grandfather; Heart disease in her father; Heart failure in her father; Stroke in her maternal grandmother and mother; Thyroid  disease in her brother and sister; Uterine cancer in her mother. Allergies:  is allergic to latex, green dyes, influenza virus vaccine, blue dyes (parenteral), codeine, lactose, lactose intolerance (gi), meloxicam, sulfa antibiotics, and yellow dye.   Medication Reconciliation: Current Outpatient Medications on File Prior to Visit  Medication Sig   albuterol  (VENTOLIN  HFA) 108 (90 Base) MCG/ACT inhaler Inhale 2 puffs into the lungs every 6 (six) hours as needed for wheezing or shortness of breath. (Patient not taking: Reported on 12/22/2023)   amLODipine  (NORVASC ) 5 MG tablet Take 1 tablet (5 mg total) by mouth daily. (Patient not taking: Reported on 12/22/2023)   amoxicillin -clavulanate (AUGMENTIN ) 875-125 MG tablet Take 1 tablet by mouth 2 (two) times daily after a meal. (Patient not taking: Reported on 12/18/2023)   azelastine  (ASTELIN ) 0.1 % nasal spray Place 2 sprays into both nostrils 2 (two) times daily as needed for rhinitis. Use in each nostril as directed (Patient not taking: Reported on 12/22/2023)   budesonide -formoterol  (SYMBICORT ) 160-4.5 MCG/ACT  inhaler Inhale 2 puffs into the lungs 2 (two) times daily. (Patient not taking: Reported on 12/22/2023)   EPINEPHrine  (AUVI-Q ) 0.3 mg/0.3 mL IJ SOAJ injection Inject 0.3 mg into the muscle as needed for anaphylaxis. (Patient not taking: Reported on 12/22/2023)   fluticasone  (FLONASE ) 50 MCG/ACT nasal spray Place 1 spray into both nostrils daily. (Patient not taking: Reported on 12/22/2023)   Ginger, Zingiber officinalis, (GINGER PO) Take by mouth. (Patient not taking: Reported on 12/22/2023)   OVER THE COUNTER MEDICATION Nature's Bounty ULTRA STRENGTH PROBIOTIC 10 (Patient not taking: Reported on 12/22/2023)   OVER THE COUNTER MEDICATION Naturello multivitamin (Patient not taking: Reported on 12/22/2023)   OVER THE COUNTER MEDICATION Women garden of life- once daily women (Patient not taking: Reported on 12/22/2023)   pantoprazole  (PROTONIX ) 40 MG tablet Take 1 tablet (40 mg total) by mouth daily. (Patient not taking: Reported on 12/22/2023)   TURMERIC PO Take by mouth. (Patient not taking: Reported on 12/22/2023)   valACYclovir  (VALTREX ) 500 MG tablet Take 1 tablet (500 mg total) by mouth daily. (Patient not taking: Reported on 12/22/2023)   No current facility-administered medications on file prior to visit.  There are no discontinued medications.   Physical Exam:    12/22/2023    2:05 PM 12/19/2023    3:45 PM 12/18/2023   10:02 AM  Vitals with BMI  Height 5' 6  5' 6  Weight 178 lbs 13 oz 181 lbs 2 oz 179 lbs  BMI 28.87 29.25 28.91  Systolic 136 126 869  Diastolic 84 88 88  Pulse 83 91 100  Vital signs reviewed.  Nursing notes reviewed. Weight trend reviewed. Physical Exam General Appearance:  No acute distress appreciable.   Well-groomed, healthy-appearing female.  Well proportioned with no abnormal fat distribution.  Good muscle tone. Pulmonary:  Normal work of breathing at rest, no respiratory  distress apparent. SpO2: 98 %  Musculoskeletal: All extremities are intact.  Neurological:  Awake,  alert, oriented, and engaged.  No obvious focal neurological deficits or cognitive impairments.  Sensorium seems unclouded.   Speech is clear and coherent with logical content. Psychiatric:  Appropriate mood, pleasant and cooperative demeanor, thoughtful and engaged during the exam Physical Exam GENERAL: Alert, well developed, no acute distress. MUSCULOSKELETAL: Severe paraspinal muscle spasm, difficulty extending back, degenerative disc disease with no disc space. NEUROLOGICAL: Normal reflexes. Exam: Paraspinal muscles on the right rock-hard, tender to palpation. Limited lumbar extension (painful) but normal balance, no radicular sensory loss, reflexes intact. Negative for rash/infection signs.   Results for orders placed or performed in visit on 12/22/23  TSH Rfx on Abnormal to Free T4  Result Value Ref Range   TSH 1.820 0.450 - 4.500 uIU/mL  CBC with Differential/Platelet  Result Value Ref Range   WBC 11.8 (H) 3.8 - 10.8 Thousand/uL   RBC 5.14 (H) 3.80 - 5.10 Million/uL   Hemoglobin 15.1 11.7 - 15.5 g/dL   HCT 54.9 64.9 - 54.9 %   MCV 87.5 80.0 - 100.0 fL   MCH 29.4 27.0 - 33.0 pg   MCHC 33.6 32.0 - 36.0 g/dL   RDW 87.5 88.9 - 84.9 %   Platelets 408 (H) 140 - 400 Thousand/uL   MPV 9.7 7.5 - 12.5 fL   Neutro Abs 8,177 (H) 1,500 - 7,800 cells/uL   Absolute Lymphocytes 2,407 850 - 3,900 cells/uL   Absolute Monocytes 979 (H) 200 - 950 cells/uL   Eosinophils Absolute 201 15 - 500 cells/uL   Basophils Absolute 35 0 - 200 cells/uL   Neutrophils Relative % 69.3 %   Total Lymphocyte 20.4 %   Monocytes Relative 8.3 %   Eosinophils Relative 1.7 %   Basophils Relative 0.3 %  Comprehensive metabolic panel with GFR  Result Value Ref Range   Glucose, Bld 97 65 - 99 mg/dL   BUN 10 7 - 25 mg/dL   Creat 9.27 9.49 - 8.96 mg/dL   eGFR 96 > OR = 60 fO/fpw/8.26f7   BUN/Creatinine Ratio SEE NOTE: 6 - 22 (calc)   Sodium 138 135 - 146 mmol/L   Potassium 4.0 3.5 - 5.3 mmol/L   Chloride 101  98 - 110 mmol/L   CO2 27 20 - 32 mmol/L   Calcium 9.2 8.6 - 10.4 mg/dL   Total Protein 7.2 6.1 - 8.1 g/dL   Albumin 4.6 3.6 - 5.1 g/dL   Globulin 2.6 1.9 - 3.7 g/dL (calc)   AG Ratio 1.8 1.0 - 2.5 (calc)   Total Bilirubin 0.6 0.2 - 1.2 mg/dL   Alkaline phosphatase (APISO) 81 37 - 153 U/L   AST 15 10 - 35 U/L   ALT 11 6 - 29 U/L  Lipid panel  Result Value Ref Range   Cholesterol 199 <200 mg/dL   HDL 55 > OR = 50 mg/dL   Triglycerides 870 <849 mg/dL   LDL Cholesterol (Calc) 120 (H) mg/dL (calc)   Total CHOL/HDL Ratio 3.6 <5.0 (calc)   Non-HDL Cholesterol (Calc) 144 (H) <130 mg/dL (calc)   Office Visit on 12/22/2023  Component Date Value   TSH 12/22/2023 1.820    WBC 12/22/2023 11.8 (H)    RBC 12/22/2023 5.14 (H)    Hemoglobin 12/22/2023 15.1    HCT 12/22/2023 45.0    MCV 12/22/2023 87.5    MCH 12/22/2023 29.4    MCHC 12/22/2023 33.6    RDW 12/22/2023  12.4    Platelets 12/22/2023 408 (H)    MPV 12/22/2023 9.7    Neutro Abs 12/22/2023 8,177 (H)    Absolute Lymphocytes 12/22/2023 2,407    Absolute Monocytes 12/22/2023 979 (H)    Eosinophils Absolute 12/22/2023 201    Basophils Absolute 12/22/2023 35    Neutrophils Relative % 12/22/2023 69.3    Total Lymphocyte 12/22/2023 20.4    Monocytes Relative 12/22/2023 8.3    Eosinophils Relative 12/22/2023 1.7    Basophils Relative 12/22/2023 0.3    Glucose, Bld 12/22/2023 97    BUN 12/22/2023 10    Creat 12/22/2023 0.72    eGFR 12/22/2023 96    BUN/Creatinine Ratio 12/22/2023 SEE NOTE:    Sodium 12/22/2023 138    Potassium 12/22/2023 4.0    Chloride 12/22/2023 101    CO2 12/22/2023 27    Calcium 12/22/2023 9.2    Total Protein 12/22/2023 7.2    Albumin 12/22/2023 4.6    Globulin 12/22/2023 2.6    AG Ratio 12/22/2023 1.8    Total Bilirubin 12/22/2023 0.6    Alkaline phosphatase (AP* 12/22/2023 81    AST 12/22/2023 15    ALT 12/22/2023 11    Cholesterol 12/22/2023 199    HDL  12/22/2023 55    Triglycerides 12/22/2023 129    LDL Cholesterol (Calc) 12/22/2023 120 (H)    Total CHOL/HDL Ratio 12/22/2023 3.6    Non-HDL Cholesterol (Cal* 12/22/2023 144 (H)   Office Visit on 12/18/2023  Component Date Value   MICRO NUMBER: 12/18/2023 [id number]    SPECIMEN QUALITY: 12/18/2023 Adequate    Sample Source 12/18/2023 URINE, CLEAN CATCH    STATUS: 12/18/2023 FINAL    Result: 12/18/2023 No Growth    Color, Urine 12/18/2023 YELLOW    APPearance 12/18/2023 CLEAR    Specific Gravity, Urine 12/18/2023 <=1.005 (A)    pH 12/18/2023 7.0    Total Protein, Urine 12/18/2023 NEGATIVE    Urine Glucose 12/18/2023 NEGATIVE    Ketones, ur 12/18/2023 NEGATIVE    Bilirubin Urine 12/18/2023 NEGATIVE    Hgb urine dipstick 12/18/2023 TRACE-INTACT (A)    Urobilinogen, UA 12/18/2023 0.2    Leukocytes,Ua 12/18/2023 NEGATIVE    Nitrite 12/18/2023 NEGATIVE    WBC, UA 12/18/2023 none seen    RBC / HPF 12/18/2023 0-2/hpf    Color, UA 12/18/2023 yellow    Clarity, UA 12/18/2023 clear    Glucose, UA 12/18/2023 Negative    Bilirubin, UA 12/18/2023 negative    Ketones, UA 12/18/2023 negative    Spec Grav, UA 12/18/2023 <=1.005 (A)    Blood, UA 12/18/2023 positvie    pH, UA 12/18/2023 7.0    Protein, UA 12/18/2023 Negative    Urobilinogen, UA 12/18/2023 0.2    Nitrite, UA 12/18/2023 negative    Leukocytes, UA 12/18/2023 Negative   Office Visit on 05/19/2023  Component Date Value   Rapid Strep A Screen 05/19/2023 Negative   No image results found. CT RENAL STONE STUDY Result Date: 12/18/2023 CLINICAL DATA:  Abdominal/flank pain, stone suspected EXAM: CT ABDOMEN AND PELVIS WITHOUT CONTRAST TECHNIQUE: Multidetector CT imaging of the abdomen and pelvis was performed following the standard protocol without IV contrast. RADIATION DOSE REDUCTION: This exam was performed according to the departmental dose-optimization program which includes automated exposure  control, adjustment of the mA and/or kV according to patient size and/or use of iterative reconstruction technique. COMPARISON:  November 18, 2022 FINDINGS: Of note, the lack of intravenous contrast limits evaluation of the solid organ parenchyma and vascularity.  Lower chest: No focal airspace consolidation or pleural effusion. Hepatobiliary: Subcentimeter hypodensity in the inferior right hepatic lobe, too small to definitively characterize, possibly a small cyst or hamartoma. No radiopaque stones or wall thickening of the gallbladder. No intrahepatic or extrahepatic biliary ductal dilation. Pancreas: No mass or main ductal dilation. No peripancreatic inflammation or fluid collection. Spleen: Normal size. No mass. Adrenals/Urinary Tract: No adrenal masses. No renal mass. No hydronephrosis or nephrolithiasis. The urinary bladder is distended without focal abnormality. Stomach/Bowel: The stomach is decompressed without focal abnormality. No small bowel wall thickening or inflammation. No small bowel obstruction.Normal appendix. Sigmoid diverticulosis. Vascular/Lymphatic: No aortic aneurysm. Scattered aortoiliac atherosclerosis. No intraabdominal or pelvic lymphadenopathy. Reproductive: Age-related atrophy of the uterus and ovaries. No concerning adnexal mass. No free pelvic fluid. Other: No pneumoperitoneum, ascites, or mesenteric inflammation. Musculoskeletal: No acute fracture or destructive lesion. Small bone island in the right iliac bone. Multilevel degenerative disc disease of the spine with minimal lateral listhesis of a couple of lumbar vertebral bodies. IMPRESSION: 1.  No acute intra-abdominal or pelvic abnormality. 2. Sigmoid diverticulosis.  No changes of acute diverticulitis. Electronically Signed   By: Rogelia Myers M.D.   On: 12/18/2023 18:44        12/18/2023   10:13 AM 10/02/2023    4:22 PM 05/19/2023    4:10 PM 04/12/2023    3:36 PM  PHQ 2/9 Scores  PHQ - 2 Score 0 0 0 0  PHQ- 9 Score   0     Results RADIOLOGY CT scan: Negative for nephrolithiasis and urinary tract infection. Degenerative disc disease with no disc at L2, bone-on-bone contact, height loss of vertebrae, no fracture. (12/19/2023) Objective (O): Imaging: CT abd/pelvis (no stones/UTI) actually demonstrates severe disc-space collapse at L2-3 with osteophyte-formation and spinous-process irregularity (bone-on-bone) correlating with pain location. Personally reviewed and interpreted images with patient- this was not reported on radiology read which was for renal stone protocol.     Assessment & Plan Back pain of lumbar region with sciatica     ASSESSMENT:  Severe degenerative disc disease at L2-3 with secondary paraspinal muscle spasm ? primary pain generator. Possible nerve-root irritation (intermittent incontinence currently not present is a red flag). Chronic right ring-finger paresthesia (benign, unrelated). 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 Pain characteristics (location, quality, radiation pattern) and physical exam findings support diagnosis. Pain is mechanical in nature, worsened with activity and improved with rest. Differential Diagnosis Excluded: Bone tumor - No palpable masses, no unexplained weight loss, no pain that worsens at night Acute fracture - No severe trauma, no significant bone loss, no point tenderness over vertebrae Aortic aneurysm - No hypertension history, no smoking history, absence of tearing/ripping sensations Vertebral disk infection - No fever, no recent infections, no IVDU history Pyelonephritis - No pain radiating to groin, no costovertebral angle tenderness, no urinary symptoms Cauda equina syndrome - No weakness/numbness, no loss of bowel or bladder function  RISK ASSESSMENT: Low risk for serious pathology based on absence of red flags.  Patient educated on emergency warning signs requiring immediate evaluation.  TREATMENT PLAN   Urgent MRI lumbosacral spine (with contrast if available) to evaluate degree of nerve-root compression given new incontinence. Neurosurgery referral: Dr. Reyes Budge (spinal surgeon) for evaluation of potential fusion vs. disc-replacement. Dynamic flexion/extension spine X-rays (cheaper alternative) to quantify instability. Pain management: Give Toradol  IM in office (6 hrs relief). Transition to Celebrex  200 mg QD (  or Advil 600 mg TID prn) with food. Home muscle relaxer (e.g., cyclobenzaprine ) and gabapentin  as needed. Conservative therapy: Gentle spinal-alignment physical therapy ([facility]). Dry-needling/acupuncture and cupping for spasms. Avoid forward bending/lifting until cleared. Supportive measures: Memory-foam mattress-top for spinal support. Education on red flags: worsening neuro deficits, loss of bowel/bladder control ? ER if acute. Labs: agreed to order fasting labs for her  upcoming physical - need to get contrast approved anyway. Follow-up: Return after MRI and surgeon consultation to finalize plan.   Acute Pain Management Rest for 24-48 hours, then gradual return to activity as tolerated Cold packs for 20 minutes every 2-3 hours for first 48-72 hours Transition to heat therapy after 72 hours if preferred (no sleeping on heating pad) Acetaminophen 650mg  every 6 hours as needed for pain NSAIDs (if not contraindicated): Ibuprofen 600mg  every 6 hours with food and plenty of fluids Muscle relaxant: Cyclobenzaprine  5mg  at bedtime as needed for muscle spasm  Activity Modification Proper body mechanics reviewed for lifting techniques Avoid heavy lifting (>10 lbs) for 2 weeks Gradual return to normal activities as tolerated Maintain good posture during sitting, standing, and sleeping  Rehabilitation Home exercise program instructions provided in After Visit Summary Focus  on gentle stretching and core strengthening exercises Physical Therapy referral placed for comprehensive evaluation and treatment Begin exercises once acute pain subsides (typically 3-5 days)  Diagnostics X-ray of lumbar spine ordered to assess for degenerative changes Advanced imaging not indicated at this time given absence of red flags Will reassess need for further workup based on treatment response  Follow-up Plan Return to clinic in 2-3 weeks if symptoms persist despite treatment Contact office sooner if symptoms worsen or if red flag symptoms develop Physical therapy to begin within 1-2 weeks   RED FLAG WARNING SIGNS - GO TO EMERGENCY ROOM IF YOU DEVELOP: New weakness or numbness in legs Loss of bladder or bowel control Saddle anesthesia (numbness in groin/rectal area) Severe, progressive, or disabling pain despite treatment Fever over 101F with back pain Sudden onset of severe tearing/ripping pain    EVIDENCE-BASED RECOMMENDATIONS: Treatment plan follows current clinical guidelines from the [employer] and [employer]. Early mobility, appropriate pain control, and progressive exercise have demonstrated better outcomes than prolonged rest or early imaging for non-specific back pain without red flags.   Urinary incontinence, unspecified type Intermittent, now resolved Paresthesia of finger Intermittent numbness in the right ring finger is likely due to nerve compression or positioning. There is no evidence of vascular compromise as there is no discoloration. Monitor symptoms and avoid positions that exacerbate numbness. Other chronic sinusitis Ongoing sinus infection for several months contributes to overall discomfort. There is no direct link to current back pain and nerve compression issues.  We will get MRI with contrast to rule out infectious causes.        Orders Placed During this Encounter:   Orders Placed This Encounter  Procedures    DG Lumb Spine Flex&Ext Only    Standing Status:   Future    Number of Occurrences:   1    Expiration Date:   12/21/2024    Reason for Exam (SYMPTOM  OR DIAGNOSIS REQUIRED):   back pain with urinary incontinence    Is patient pregnant?:   No    Preferred imaging location?:   [facility]   MR LUMBAR SPINE W WO CONTRAST    Clinical History & Urgency:  58 year old female with known severe degenerative disc collapse at L2-3  on recent CT, now presenting with acute exacerbation of chronic low back pain, labor-like paraspinal spasms, and new-onset urinary incontinence. These findings constitute red flags for possible cauda equina or significant nerve-root compression and demand urgent imaging to expedite diagnosis and management.  Rationale for Contrast-Enhanced MRI:      Discogenic and Post-Contrast Differentiation          Contrast is essential to distinguish disc herniation or annular tears from peridiscal fibrosis or scar, particularly at the collapsed L2-3 level where bone-on-bone changes and osteophytes obscure non-enhanced anatomy.      Rule Out Infection or Neoplasm          Patient's prolonged sinus infections and intermittent bacteremia raise concern for epidural phlegmon/abscess or discitis. Contrast enhancement will reveal epidural fluid collections, rim-enhancing abscesses, or vertebral marrow edema.          Contrast is also needed to detect unexpected neoplastic infiltration or metastatic disease that can present with rapid neurologic decline.      Assessment of Neural Elements          Post-contrast T1 sequences improve visualization of nerve roots and thecal sac, clarifying the degree of foraminal stenosis, root impingement, and enhancement patterns consistent with neuritis.  Requested Protocol:      Urgent lumbar spine MRI with and without gadolinium      Sequences: Sagittal T1, T2, STIR; axial T1, T2; post-contrast sagittal and axial T1 with fat saturation       Coverage: From T12-L1 through S1 nerve roots  These findings and protocol are medically necessary to (1) confirm or exclude cauda equina syndrome, (2) characterize the severity and nature of disc/osseous pathology, (3) identify infectious or neoplastic etiologies, and (4) guide timely neurosurgical intervention.    Standing Status:   Future    Expiration Date:   12/22/2024    If indicated for the ordered procedure, I authorize the administration of contrast media per Radiology protocol:   Yes    What is the patient's sedation requirement?:   No Sedation    Does the patient have a pacemaker or implanted devices?:   No    Preferred imaging location?:   [facility]   TSH Rfx on Abnormal to Free T4   Hemoglobin A1c   CBC with Differential/Platelet    Release to patient:   Immediate [1]   Comprehensive metabolic panel with GFR    Has the patient fasted?:   No    Release to patient:   Immediate [1]   Lipid panel    [place]    Has the patient fasted?:   No    Release to patient:   Immediate [1]   Ambulatory referral to Spine Surgery    Referral Priority:   Routine    Referral Reason:   Specialty Services Required    Requested Specialty:   Neurosurgery    Number of Visits Requested:   1   Ambulatory referral to Physical Therapy    Referral Priority:   Routine    Referral Type:   Physical Medicine    Referral Reason:   Specialty Services Required    Requested Specialty:   Physical Therapy    Number of Visits Requested:   1   Meds ordered this encounter  Medications   cyclobenzaprine  (FLEXERIL ) 10 MG tablet    Sig: Take 1 tablet (10 mg total) by mouth 3 (three) times daily as needed for muscle spasms.    Dispense:  30 tablet    Refill:  0   gabapentin  (NEURONTIN ) 300 MG capsule    Sig: Take 1 capsule (300 mg total) by mouth 3 (three) times daily.    Dispense:  90 capsule    Refill:  3   celecoxib  (CELEBREX ) 100 MG capsule    Sig: Take 1 capsule (100 mg total) by  mouth 2 (two) times daily. Try instead of advil..choose preferred.    Dispense:  90 capsule    Refill:  1   ketorolac  (TORADOL ) injection 60 mg   Medical Decision Making: 1 or more chronic illnesses with exacerbation,  progression, or side effects of treatment 1 undiagnosed new problem with uncertain prognosis     Ordering of each unique test; 5 Prescription drug management        This document was synthesized by artificial intelligence (Abridge) using HIPAA-compliant recording of the clinical interaction;   We discussed the use of AI scribe software for clinical note transcription with the patient, who gave verbal consent to proceed. additional Info: This encounter employed state-of-the-art, real-time, collaborative documentation. The patient actively reviewed and assisted in updating their electronic medical record on a shared screen, ensuring transparency and facilitating joint problem-solving for the problem list, overview, and plan. This approach promotes accurate, informed care. The treatment plan was discussed and reviewed in detail, including medication safety, potential side effects, and all patient questions. We confirmed understanding and comfort with the plan. Follow-up instructions were established, including contacting the office for any concerns, returning if symptoms worsen, persist, or new symptoms develop, and precautions for potential emergency department visits.

## 2023-12-23 ENCOUNTER — Encounter: Payer: Self-pay | Admitting: Internal Medicine

## 2023-12-23 LAB — CBC WITH DIFFERENTIAL/PLATELET
Absolute Lymphocytes: 2407 {cells}/uL (ref 850–3900)
Absolute Monocytes: 979 {cells}/uL — ABNORMAL HIGH (ref 200–950)
Basophils Absolute: 35 {cells}/uL (ref 0–200)
Basophils Relative: 0.3 %
Eosinophils Absolute: 201 {cells}/uL (ref 15–500)
Eosinophils Relative: 1.7 %
HCT: 45 % (ref 35.0–45.0)
Hemoglobin: 15.1 g/dL (ref 11.7–15.5)
MCH: 29.4 pg (ref 27.0–33.0)
MCHC: 33.6 g/dL (ref 32.0–36.0)
MCV: 87.5 fL (ref 80.0–100.0)
MPV: 9.7 fL (ref 7.5–12.5)
Monocytes Relative: 8.3 %
Neutro Abs: 8177 {cells}/uL — ABNORMAL HIGH (ref 1500–7800)
Neutrophils Relative %: 69.3 %
Platelets: 408 10*3/uL — ABNORMAL HIGH (ref 140–400)
RBC: 5.14 10*6/uL — ABNORMAL HIGH (ref 3.80–5.10)
RDW: 12.4 % (ref 11.0–15.0)
Total Lymphocyte: 20.4 %
WBC: 11.8 10*3/uL — ABNORMAL HIGH (ref 3.8–10.8)

## 2023-12-23 LAB — COMPREHENSIVE METABOLIC PANEL WITH GFR
AG Ratio: 1.8 (calc) (ref 1.0–2.5)
ALT: 11 U/L (ref 6–29)
AST: 15 U/L (ref 10–35)
Albumin: 4.6 g/dL (ref 3.6–5.1)
Alkaline phosphatase (APISO): 81 U/L (ref 37–153)
BUN: 10 mg/dL (ref 7–25)
CO2: 27 mmol/L (ref 20–32)
Calcium: 9.2 mg/dL (ref 8.6–10.4)
Chloride: 101 mmol/L (ref 98–110)
Creat: 0.72 mg/dL (ref 0.50–1.03)
Globulin: 2.6 g/dL (ref 1.9–3.7)
Glucose, Bld: 97 mg/dL (ref 65–99)
Potassium: 4 mmol/L (ref 3.5–5.3)
Sodium: 138 mmol/L (ref 135–146)
Total Bilirubin: 0.6 mg/dL (ref 0.2–1.2)
Total Protein: 7.2 g/dL (ref 6.1–8.1)
eGFR: 96 mL/min/{1.73_m2} (ref >=60)

## 2023-12-23 LAB — LIPID PANEL
Cholesterol: 199 mg/dL (ref <200)
HDL: 55 mg/dL (ref >=50)
LDL Cholesterol (Calc): 120 mg/dL — ABNORMAL HIGH
Non-HDL Cholesterol (Calc): 144 mg/dL — ABNORMAL HIGH (ref <130)
Total CHOL/HDL Ratio: 3.6 (calc) (ref <5.0)
Triglycerides: 129 mg/dL (ref <150)

## 2023-12-23 LAB — TSH RFX ON ABNORMAL TO FREE T4: TSH: 1.82 u[IU]/mL (ref 0.450–4.500)

## 2023-12-23 NOTE — Progress Notes (Signed)
 Shared message on lab results to patient and Primary Care Provider (PCP).

## 2023-12-25 ENCOUNTER — Ambulatory Visit (HOSPITAL_COMMUNITY)
Admission: RE | Admit: 2023-12-25 | Discharge: 2023-12-25 | Disposition: A | Discharge status: Home / Self Care | Source: Ambulatory Visit | Attending: Internal Medicine | Admitting: Internal Medicine

## 2023-12-25 DIAGNOSIS — R32 Unspecified urinary incontinence: Secondary | ICD-10-CM | POA: Diagnosis present

## 2023-12-25 DIAGNOSIS — J328 Other chronic sinusitis: Secondary | ICD-10-CM | POA: Diagnosis present

## 2023-12-25 DIAGNOSIS — M5440 Lumbago with sciatica, unspecified side: Secondary | ICD-10-CM | POA: Insufficient documentation

## 2023-12-25 MED ORDER — GADOBUTROL 1 MMOL/ML IV SOLN
8.0000 mL | Freq: Once | INTRAVENOUS | Status: AC | PRN
  Administered 2023-12-25: 8 mL via INTRAVENOUS

## 2023-12-26 ENCOUNTER — Ambulatory Visit: Payer: Self-pay | Admitting: Internal Medicine

## 2023-12-26 NOTE — Progress Notes (Signed)
 This showed that the spine isn't shifting out of alignment on flexion and extension but I still believe it was the lifting over the garbage can motion that caused all the inflammation in the back.

## 2023-12-27 ENCOUNTER — Encounter: Payer: Self-pay | Admitting: Family Medicine

## 2023-12-27 ENCOUNTER — Encounter: Admitting: Family Medicine

## 2023-12-27 ENCOUNTER — Ambulatory Visit (INDEPENDENT_AMBULATORY_CARE_PROVIDER_SITE_OTHER): Admitting: Family Medicine

## 2023-12-27 VITALS — BP 135/87 | HR 79 | Temp 97.5°F | Resp 18 | Ht 66.0 in | Wt 183.0 lb

## 2023-12-27 DIAGNOSIS — D729 Disorder of white blood cells, unspecified: Secondary | ICD-10-CM | POA: Diagnosis not present

## 2023-12-27 DIAGNOSIS — Z0000 Encounter for general adult medical examination without abnormal findings: Secondary | ICD-10-CM

## 2023-12-27 DIAGNOSIS — R7303 Prediabetes: Secondary | ICD-10-CM

## 2023-12-27 NOTE — Progress Notes (Signed)
 Phone [phone]   Subjective:   Patient is a 58 y.o. female presenting for annual physical.    Chief Complaint  Patient presents with   Annual Exam    CPE Discuss recent blood work   Annual-some exercise.  Sweats after eats sometimes Discussed the use of AI scribe software for clinical note transcription with the patient, who gave verbal consent to proceed.  History of Present Illness Samantha Delacruz "Samantha Delacruz" is a 58 year old female who presents for an annual physical exam.  She states her immunizations are up to date, including pneumonia and shingles vaccines. Her last Pap smear was in 2023, and she is not due for another until 2028. Her mammogram is current, and she is on an annual or biannual plan after previously having cysts in both breasts. Her last colonoscopy was normal.  She has a history of hypertension but stopped taking her blood pressure medication three weeks ago due to good blood pressure readings and concerns about potential kidney side effects. No current headaches or migraines, which she previously experienced when her blood pressure was high. She has not experienced dizziness or lightheadedness despite a brief episode of heart fluttering after consuming caffeine.  She has been experiencing a persistent sinus infection, with symptoms of coughing up green mucus, but does not feel sick. She stopped taking her blood pressure medication, believing it might have been affecting her ability to handle the sinus infection. She has also stopped taking her probiotics and Protonix  due to recent courses of antibiotics.  She is currently taking three medications for her back pain, which has been severe enough to impact her mobility. She describes a significant increase in back pain following activities such as yoga, Pilates, and a field day event. She has had a CT scan and MRI, which revealed degenerative disc disease with compression, raising concerns about potential paralysis  and loss of bladder and bowel control.  She has experienced outbreaks of herpes on her face and has taken her herpes medication as a precaution. She reports being under significant stress due to her back issues.  She notes that she has been sweating after eating, particularly with warm foods, for the past six to eight months. This is a new symptom for her, and she is unsure of the pattern or cause.  Her recent blood work showed a slightly elevated white blood cell count, which was also noted in previous tests. She has been on steroids and antibiotics recently, which may have contributed to this finding. Other blood work results, including sodium, potassium, sugar, kidney function, liver function, and cholesterol, were within normal limits.  She works in an Design[employer], jewellery, which she describes as having a high availability of sugary foods. She has been trying to manage her diet and maintain her weight despite reduced physical activity due to her back issues.    See problem oriented charting- ROS- ROS: Gen: no fever, chills  Skin: no rash, itching ENT: still nasal drainage but not feel "sick" Eyes: no blurry vision, double vision Resp: no cough, wheeze,SOB.   CV: no CP, palpitations, LE edema,   flutters when caffeine-this am. GI: no heartburn, n/v/d/c, abd pain GU: no dysuria, urgency, frequency, hematuria MSK: sciatica issues. Neuro: no dizziness, headache, weakness, vertigo Psych: no depression, anxiety, insomnia, SI   The following were reviewed and entered/updated in epic: Past Medical History:  Diagnosis Date   Adult ADHD    Anemia    history of   Angio-edema  Asthma    Brain injury (HCC)    Headache    Migraines   Heart palpitations    Hemorrhoids, internal, with bleeding and mucous discharge - Grade 1 02/05/2014   Herpes    Hypertension    Migraines    Pneumonia    PTSD (post-traumatic stress disorder)    has a stalker   Recurrent infections 12/20/2023    Restless leg    Rosacea    Shingles    Urticaria    Vertigo    Patient Active Problem List   Diagnosis Date Noted   Recurrent infections 12/20/2023   Seasonal and perennial allergic rhinitis 12/20/2023   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   Gastroesophageal reflux disease without esophagitis 11/08/2023   Prediabetes 11/08/2023   HSV (herpes simplex virus) anogenital infection 01/24/2023   Anxiety 11/14/2021   Uterine leiomyoma 11/14/2021   Anemia 11/09/2021   Angioedema 11/09/2021   Urticaria 11/09/2021   Attention deficit hyperactivity disorder 11/09/2021   Cyst of left breast 11/09/2021   Hemorrhoids 11/09/2021   Migraine 11/09/2021   Restless legs 11/09/2021   Rosacea 11/09/2021   Traumatic brain injury (HCC) 11/09/2021   Other adverse food reactions, not elsewhere classified, subsequent encounter 01/04/2021   Adverse effect of other drugs, medicaments and biological substances, subsequent encounter 01/04/2021   Asthma 01/04/2021   HTN (hypertension) 05/14/2019   Right hip pain 08/21/2014   Palpitations 08/04/2014   Latex allergy 02/05/2014   Rectal bleeding 12/10/2013   Allergic rhinitis 09/26/2013   Benign paroxysmal positional vertigo 06/20/2013   Seborrheic keratosis, inflamed 05/23/2013   Fibrocystic breast changes 10/02/2012   Overweight 10/02/2012   Past Surgical History:  Procedure Laterality Date   CESAREAN SECTION  08/15/1992   son   COLONOSCOPY     DILATATION & CURETTAGE/HYSTEROSCOPY WITH MYOSURE N/A 04/19/2016   Procedure: DILATATION & CURETTAGE/HYSTEROSCOPY WITH MYOSURE;  Surgeon: Lacretia Piccolo, MD;  Location: [facility];  Service: Gynecology;  Laterality: N/A;  request 7:30am start time   Requests one hour OR time.    HEMORRHOID BANDING     retinal peel left eye surgery Left    retina and cataract   thrush      Family History  Problem Relation Age of Onset   Breast cancer Mother    Depression Mother    Diabetes Mother     Emphysema Mother    Stroke Mother    Uterine cancer Mother    Heart disease Father    Heart failure Father    Thyroid  disease Sister    Stroke Maternal Grandmother    Esophageal cancer Paternal Grandfather    Thyroid  disease Brother    ADD / ADHD Son    Colon cancer Neg Hx    BRCA 1/2 Neg Hx     Medications- reviewed and updated Current Outpatient Medications  Medication Sig Dispense Refill   celecoxib (CELEBREX) 100 MG capsule Take 1 capsule (100 mg total) by mouth 2 (two) times daily. Try instead of advil..choose preferred. 90 capsule 1   cyclobenzaprine  (FLEXERIL ) 10 MG tablet Take 1 tablet (10 mg total) by mouth 3 (three) times daily as needed for muscle spasms. 30 tablet 0   gabapentin  (NEURONTIN ) 300 MG capsule Take 1 capsule (300 mg total) by mouth 3 (three) times daily. 90 capsule 3   valACYclovir  (VALTREX ) 500 MG tablet Take 1 tablet (500 mg total) by mouth daily. 90 tablet 3   albuterol  (VENTOLIN  HFA) 108 (90 Base) MCG/ACT inhaler  Inhale 2 puffs into the lungs every 6 (six) hours as needed for wheezing or shortness of breath. (Patient not taking: Reported on 12/27/2023) 3 each 1   amLODipine  (NORVASC ) 5 MG tablet Take 1 tablet (5 mg total) by mouth daily. (Patient not taking: Reported on 12/27/2023) 90 tablet 1   azelastine  (ASTELIN ) 0.1 % nasal spray Place 2 sprays into both nostrils 2 (two) times daily as needed for rhinitis. Use in each nostril as directed (Patient not taking: Reported on 12/27/2023) 30 mL 4   budesonide -formoterol  (SYMBICORT ) 160-4.5 MCG/ACT inhaler Inhale 2 puffs into the lungs 2 (two) times daily. (Patient not taking: Reported on 12/27/2023) 10.2 g 3   EPINEPHrine  (AUVI-Q ) 0.3 mg/0.3 mL IJ SOAJ injection Inject 0.3 mg into the muscle as needed for anaphylaxis. (Patient not taking: Reported on 12/27/2023) 1 each 1   fluticasone  (FLONASE ) 50 MCG/ACT nasal spray Place 1 spray into both nostrils daily. (Patient not taking: Reported on 12/27/2023) 16 g 3   Ginger,  Zingiber officinalis, (GINGER PO) Take by mouth. (Patient not taking: Reported on 12/27/2023)     OVER THE COUNTER MEDICATION Nature's Bounty ULTRA STRENGTH PROBIOTIC 10 (Patient not taking: Reported on 12/27/2023)     OVER THE COUNTER MEDICATION Naturello multivitamin (Patient not taking: Reported on 12/27/2023)     OVER THE COUNTER MEDICATION Women garden of life- once daily women (Patient not taking: Reported on 12/27/2023)     pantoprazole  (PROTONIX ) 40 MG tablet Take 1 tablet (40 mg total) by mouth daily. (Patient not taking: Reported on 12/27/2023) 90 tablet 1   TURMERIC PO Take by mouth. (Patient not taking: Reported on 12/27/2023)     No current facility-administered medications for this visit.    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 DP 2+B LUNGS: CTAB. No wheezes ABDOMEN:  BS+, soft, NTND, No HSM, no masses EXT:  no edema MSK: no gross abnormalities. MS 5/5 all 4 NEURO: A&O x3.  CN II-XII intact.  PSYCH: normal mood.  Good eye contact  Discussed labs, MRI    Assessment and Plan   Health Maintenance counseling: 1. Anticipatory guidance: Patient counseled regarding regular dental exams q6 months, eye exams,  avoiding smoking and second hand smoke, limiting alcohol to 1 beverage per day, no illicit drugs.  2. Risk factor reduction:  Advised patient of need for regular exercise and diet rich and fruits and vegetables to reduce risk of heart attack and stroke. Exercise- encouraged.  Wt Readings from Last 3 Encounters:  12/27/23 183 lb (83 kg)  12/22/23 178 lb 12.8 oz (81.1 kg)  12/19/23 181 lb 1.9 oz (82.2 kg)   3. Immunizations/screenings/ancillary studies Immunization History  Administered Date(s) Administered   PFIZER(Purple Top)SARS-COV-2 Vaccination 09/16/2019, 10/14/2019, 10/26/2019, 11/16/2019, 05/15/2020, 08/05/2020   Tdap 02/02/2012, 12/14/2021   Zoster Recombinant(Shingrix ) 10/05/2018, 02/20/2019   There are no preventive care reminders to display for this patient.  4. Cervical cancer screening: utd 5. Skin cancer screening- advised regular sunscreen use. Denies worrisome, changing, or new skin lesions.  6. Birth control/STD check: n/a 7. Smoking associated screening: non smoker 8. Alcohol screening: none  Wellness examination  Abnormal WBC count -     CBC with Differential/Platelet; Future  Prediabetes -     Hemoglobin A1c; Future  Wellness-anticipatory guidance.  Work on Diet/Exercise    F/u 1 yr  Assessment and Plan Assessment & Plan Degenerative disc disease with compression   Degenerative disc disease with compression is causing significant back pain and potential neurological complications. She reports severe pain and difficulty walking, with concerns about possible paralysis and loss of bladder and bowel control. Recent CT and MRI scans indicate significant issues requiring further evaluation by a spinal surgeon. Physical therapy is planned to address vertebral alignment, but  surgical intervention may be necessary if conservative measures fail. Call the spinal surgeon to follow up on referral and MRI results. Schedule physical therapy. Discuss potential surgical intervention with the spinal surgeon-advised I didn't think needs surgery.  Sinus infection with green sputum   Chronic sinus infection persists with green sputum production, though symptoms have improved since discontinuing antihypertensive medication. She continues to experience nasal drainage and a productive cough.  Elevated white blood cell count   Elevated white blood cell count is potentially due to recent steroid use and ongoing sinus infection. Further evaluation is needed once steroids are out of the system. Symptoms of postprandial sweating and vision changes suggest evaluating for potential prediabetes. Repeat complete blood count in one month. Consider running an A1c test.  Hypertension   Hypertension was previously managed with medication, which was discontinued three weeks ago due to concerns about renal function and side effects. Blood pressure has been well-managed since discontinuation, but monitoring is necessary as medication effects have likely worn off. There is a risk of headaches if blood pressure becomes uncontrolled. Monitor blood pressure regularly at home. Return for follow-up in a few months.  Herpes simplex outbreak on face   A recent herpes simplex outbreak on the face was treated with antiviral medication. She reports improvement and no current active lesions.    Recommended follow up: Return in about 3 months (around 03/28/2024) for HTN, etc.      labs 1 month.  Lab/Order associations:n/a fasting   Ellsworth Haas, MD

## 2023-12-27 NOTE — Patient Instructions (Signed)
 It was very nice to see you today!  Sent to [facility] Address: [address] [place] [address] Phone: [phone]   PLEASE NOTE:  If you had any lab tests please let us  know if you have not heard back within a few days. You may see your results on MyChart before we have a chance to review them but we will give you a call once they are reviewed by us . If we ordered any referrals today, please let us  know if you have not heard from their office within the next week.   Please try these tips to maintain a healthy lifestyle:  Eat most of your calories during the day when you are active. Eliminate processed foods including packaged sweets (pies, cakes, cookies), reduce intake of potatoes, white bread, white pasta, and white rice. Look for whole grain options, oat flour or almond flour.  Each meal should contain half fruits/vegetables, one quarter protein, and one quarter carbs (no bigger than a computer mouse).  Cut down on sweet beverages. This includes juice, soda, and sweet tea. Also watch fruit intake, though this is a healthier sweet option, it still contains natural sugar! Limit to 3 servings daily.  Drink at least 1 glass of water with each meal and aim for at least 8 glasses per day  Exercise at least 150 minutes every week.

## 2024-01-01 ENCOUNTER — Telehealth: Payer: Self-pay | Admitting: *Deleted

## 2024-01-01 NOTE — Telephone Encounter (Signed)
 Copied from CRM [id number]. Topic: Referral - Question >> Jan 01, 2024  9:31 AM Lotus Round B wrote: Reason for CRM: pt called in to see if she can get physical therapy with [facility] because the other place she was referred to, was to expensive for her . If there is anyway someone can give her a call on this matter

## 2024-01-03 NOTE — Therapy (Signed)
 OUTPATIENT PHYSICAL THERAPY THORACOLUMBAR EVALUATION   Patient Name: Samantha Delacruz MRN: [id number] DOB:05-26-1966, 58 y.o., female Today's Date: 01/04/2024  END OF SESSION:  PT End of Session - 01/04/24 1340     Visit Number 1    Number of Visits 16    Date for PT Re-Evaluation 03/28/24    PT Start Time 1343    PT Stop Time 1428    PT Time Calculation (min) 45 min    Activity Tolerance Patient tolerated treatment well    Behavior During Therapy Anxious             Past Medical History:  Diagnosis Date   Adult ADHD    Anemia    history of   Angio-edema    Asthma    Brain injury (HCC)    Headache    Migraines   Heart palpitations    Hemorrhoids, internal, with bleeding and mucous discharge - Grade 1 02/05/2014   Herpes    Hypertension    Migraines    Pneumonia    PTSD (post-traumatic stress disorder)    has a stalker   Recurrent infections 12/20/2023   Restless leg    Rosacea    Shingles    Urticaria    Vertigo    Past Surgical History:  Procedure Laterality Date   CESAREAN SECTION  08/15/1992   son   COLONOSCOPY     DILATATION & CURETTAGE/HYSTEROSCOPY WITH MYOSURE N/A 04/19/2016   Procedure: DILATATION & CURETTAGE/HYSTEROSCOPY WITH MYOSURE;  Surgeon: Lacretia Piccolo, MD;  Location: [facility];  Service: Gynecology;  Laterality: N/A;  request 7:30am start time   Requests one hour OR time.    HEMORRHOID BANDING     retinal peel left eye surgery Left    retina and cataract   thrush     Patient Active Problem List   Diagnosis Date Noted   Recurrent infections 12/20/2023   Seasonal and perennial allergic rhinitis 12/20/2023   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   Gastroesophageal reflux disease without esophagitis 11/08/2023   Prediabetes 11/08/2023   HSV (herpes simplex virus) anogenital infection 01/24/2023   Anxiety 11/14/2021   Uterine leiomyoma 11/14/2021   Anemia 11/09/2021   Angioedema 11/09/2021   Urticaria 11/09/2021    Attention deficit hyperactivity disorder 11/09/2021   Cyst of left breast 11/09/2021   Hemorrhoids 11/09/2021   Migraine 11/09/2021   Restless legs 11/09/2021   Rosacea 11/09/2021   Traumatic brain injury (HCC) 11/09/2021   Other adverse food reactions, not elsewhere classified, subsequent encounter 01/04/2021   Adverse effect of other drugs, medicaments and biological substances, subsequent encounter 01/04/2021   Asthma 01/04/2021   HTN (hypertension) 05/14/2019   Right hip pain 08/21/2014   Palpitations 08/04/2014   Latex allergy 02/05/2014   Rectal bleeding 12/10/2013   Allergic rhinitis 09/26/2013   Benign paroxysmal positional vertigo 06/20/2013   Seborrheic keratosis, inflamed 05/23/2013   Fibrocystic breast changes 10/02/2012   Overweight 10/02/2012    PCP: Christel Cousins, MD  REFERRING PROVIDER: Anthon Kins, MD  REFERRING DIAG: M54.40 (ICD-10-CM) - Back pain of lumbar region with sciatica  Rationale for Evaluation and Treatment: Rehabilitation  THERAPY DIAG:  Other low back pain  Muscle weakness (generalized)  ONSET DATE: a couple weeks  SUBJECTIVE:  SUBJECTIVE STATEMENT: States she has had sinus issues and has not been very active. States she was trying to get back to exercises with piliates, aerobics and PE class where she was bending a lot and she started having intense pain after a lot of bending. States she thought she had kidney stones so she got a CT which didn't find much. Her pain was so severe she couldn't walk. She has had on and off pain over the years but never like this.   Previously she thought her pain was coming from her hips. She had some SIJ injections and one helped a lot and another did not. States that she thinks she needs to change her sleeping positions  because she sleeps in a twisted position. She wants to avoid surgery. No numbness or tingling noted on this date. History of tingling in legs but thinks it's a vein issue.   PERTINENT HISTORY:  Migraines, cesarean, TBI history, incontinence   PAIN:  Are you having pain? Yes: NPRS scale: 8/10, current 3/10 Pain location: low back into hips  Pain description: across back, awareness  Aggravating factors: bending, being still, stretching Relieving factors: standing, laying, sitting  PRECAUTIONS: None  RED FLAGS: None   WEIGHT BEARING RESTRICTIONS: No  FALLS:  Has patient fallen in last 6 months? 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   NEXT MD VISIT: June 14th  OBJECTIVE:  Note: Objective measures were completed at Evaluation unless otherwise noted.  DIAGNOSTIC FINDINGS:  12/25/23 Lumbar MRI  IMPRESSION: 1. Moderate levoconvex curvature of the lumbar spine centered at L3-4. 2. Mild foraminal narrowing bilaterally at L2-3. 3. Moderate foraminal stenosis bilaterally at L3-4 left greater than right. 4. Mild right foraminal stenosis at L4-5. 5. Shallow central disc protrusion at L5-S1 without stenosis. 6. Type 1 edematous endplate marrow changes on the right at L2-3 and L3-4. This likely contributes to low back pain.  PATIENT SURVEYS:  Patient-specific activity functional scoring scheme (Point to one number):  "0" represents "unable to perform." "10" represents "able to perform at prior level. 0 1 2 3 4 5 6 7 8 9  10 (Date and Score) Activity Initial  Activity Eval     Driving > 1 hr  0    Climbing stairs   5    Walking across room 6    Additional Additional Total score = sum of the activity scores/number of activities Minimum detectable change (90%CI) for average score = 2 points Minimum detectable change (90%CI) for single  activity score = 3 points PSFS developed by: [place] Spitz., & Binkley, J. (1995). Assessing disability and change on individual  patients: a report of a patient specific measure. Physiotherapy [place], 47, [phone]. Reproduced with the permission of the authors  Score: 11/3=3.66   COGNITION: Overall cognitive status: History of cognitive impairments - at baseline per patient with history of TBI    SENSATION: Not tested     POSTURE: rounded shoulders, forward head, and flexed trunk   PALPATION: Increased resting tone and pain with palpation along right glutes and left lumbar paraspinals  LUMBAR ROM:   AROM eval  Flexion 50% limited *  Extension 90% limited *  Right lateral flexion 50 %limited burning pain*  Left lateral flexion 75% limited*  Right rotation   Left rotation    (Blank rows =  not tested) pain    LE Measurements Lower Extremity Right EVAL Left EVAL   A/PROM MMT A/PROM MMT  Hip Flexion WFL 3+ WFL 4*  Hip Extension WFL 4- WFL 4-  Hip Abduction      Hip Adduction      Hip Internal rotation WFL*  WFL*   Hip External rotation [facility]  WFL   Knee Flexion  4  4  Knee Extension  4+  4+  Ankle Dorsiflexion  4+  4+  Ankle Plantarflexion      Ankle Inversion      Ankle Eversion       (Blank rows = not tested) * pain   LUMBAR SPECIAL TESTS:   SLUMP neg B SLR neg B  FUNCTIONAL TESTS:  Pain and breath holding with all bed mobilities      TREATMENT DATE:                                                                                                                               01/04/2024  Therapeutic Exercise:  Aerobic: Supine: Prone: lying on 2 pillows --> 1 pillow , prone on elbows x15 5" holds  Seated:  Standing: Neuromuscular Re-education:on exhaling with motion- practiced in clinic Manual Therapy: Therapeutic Activity: Self Care: Trigger Point Dry Needling:  Modalities:     PATIENT EDUCATION:  Education details: on  current presentation, on HEP, on clinical outcomes score and POC, on good/bad symptoms, on CT findings Person educated: Patient Education method: Programmer, multimedia, Demonstration, and Handouts Education comprehension: verbalized understanding   HOME EXERCISE PROGRAM: [id number]  ASSESSMENT:  CLINICAL IMPRESSION: Patient presents to PT with complaints of chronic low back pain and recent episodic flare up that limited her ability to walk and move at home.  Patient presents with posture, strength and mobility flare ups that are likely contributing to current presentation. No saddle symptoms or incontinence noted on this date, will continue to monitor secondary to history of incontinence. Patient would greatly benefit from skilled PT to improve overall function and QOL.   OBJECTIVE IMPAIRMENTS: decreased activity tolerance, decreased mobility, difficulty walking, decreased ROM, decreased strength, improper body mechanics, postural dysfunction, and pain.   ACTIVITY LIMITATIONS: lifting, bending, stairs, bed mobility, and locomotion level  PARTICIPATION LIMITATIONS: community activity and occupation  PERSONAL FACTORS: Age, Past/current experiences, and 1-2 comorbidities: hx of TBI, hx of incontinence  are also affecting patient's functional outcome.   REHAB POTENTIAL: Good  CLINICAL DECISION MAKING: Stable/uncomplicated  EVALUATION COMPLEXITY: Low   GOALS: Goals reviewed with patient? yes  SHORT TERM GOALS: Target date: 02/15/2024   Patient will be independent in self management strategies to improve quality of life and functional outcomes. Baseline: New Program Goal status: INITIAL  2.  Patient will report at least 50% improvement in overall symptoms and/or function to demonstrate improved functional mobility Baseline: 0% better Goal status: INITIAL  3.  Patient will report lying on stomach for a few minutes daily  to help with decompression of lumbar spine  Baseline: not currently Goal  status: INITIAL      LONG TERM GOALS: Target date: 03/28/2024    Patient will report at least 75% improvement in overall symptoms and/or function to demonstrate improved functional mobility Baseline: 0% better Goal status: INITIAL  2.  Patient will score at least 2 points higher on PSFS average to demonstrate change in overall function. Baseline: see above Goal status: INITIAL  3.  Patient will be able to demonstrate proper bending mechanics to reduce risk of re-injury Baseline: not currently Goal status: INITIAL     PLAN:  PT FREQUENCY: 1-2x/week for a total of 16 visits over 12 week certification period   PT DURATION: 12 weeks  PLANNED INTERVENTIONS: 97110-Therapeutic exercises, 97530- Therapeutic activity, 97112- Neuromuscular re-education, 97535- Self Care, 13086- Manual therapy, [phone]- Gait training, [phone]- Orthotic Fit/training, [phone]- Canalith repositioning, [id number]- Aquatic Therapy, [id number]- Electrical stimulation (unattended), [phone]- Ionotophoresis 4mg /ml Dexamethasone , Patient/Family education, Balance training, Stair training, Taping, Dry Needling, Joint mobilization, Joint manipulation, Spinal manipulation, Spinal mobilization, Cryotherapy, and Moist heat   PLAN FOR NEXT SESSION: f/u with prone extension series, stability exercises, LE/core strength, LTR, try manual 90/90 relief if painful  likes printouts   3:03 PM, 01/04/24 Tabitha Ewings, DPT Physical Therapy with [place]

## 2024-01-04 ENCOUNTER — Ambulatory Visit (INDEPENDENT_AMBULATORY_CARE_PROVIDER_SITE_OTHER): Admitting: Physical Therapy

## 2024-01-04 ENCOUNTER — Encounter: Payer: Self-pay | Admitting: Physical Therapy

## 2024-01-04 DIAGNOSIS — M5459 Other low back pain: Secondary | ICD-10-CM

## 2024-01-04 DIAGNOSIS — M6281 Muscle weakness (generalized): Secondary | ICD-10-CM | POA: Diagnosis not present

## 2024-01-14 LAB — STREP PNEUMONIAE 23 SEROTYPES IGG
Pneumo Ab Type 1*: 0.3 ug/mL — ABNORMAL LOW (ref >1.3)
Pneumo Ab Type 12 (12F)*: <0.1 ug/mL — ABNORMAL LOW (ref >1.3)
Pneumo Ab Type 14*: 1.3 ug/mL — ABNORMAL LOW (ref >1.3)
Pneumo Ab Type 17 (17F)*: 0.5 ug/mL — ABNORMAL LOW (ref >1.3)
Pneumo Ab Type 19 (19F)*: 2.3 ug/mL (ref >1.3)
Pneumo Ab Type 2*: <0.2 ug/mL — ABNORMAL LOW (ref >1.3)
Pneumo Ab Type 20*: 1.8 ug/mL (ref >1.3)
Pneumo Ab Type 22 (22F)*: 0.1 ug/mL — ABNORMAL LOW (ref >1.3)
Pneumo Ab Type 23 (23F)*: 0.3 ug/mL — ABNORMAL LOW (ref >1.3)
Pneumo Ab Type 26 (6B)*: 0.5 ug/mL — ABNORMAL LOW (ref >1.3)
Pneumo Ab Type 3*: 1.1 ug/mL — ABNORMAL LOW (ref >1.3)
Pneumo Ab Type 34 (10A)*: 6.4 ug/mL (ref >1.3)
Pneumo Ab Type 4*: 0.3 ug/mL — ABNORMAL LOW (ref >1.3)
Pneumo Ab Type 43 (11A)*: 4 ug/mL (ref >1.3)
Pneumo Ab Type 5*: <0.1 ug/mL — ABNORMAL LOW (ref >1.3)
Pneumo Ab Type 51 (7F)*: 1.8 ug/mL (ref >1.3)
Pneumo Ab Type 54 (15B)*: 1.7 ug/mL (ref >1.3)
Pneumo Ab Type 56 (18C)*: 0.1 ug/mL — ABNORMAL LOW (ref >1.3)
Pneumo Ab Type 57 (19A)*: 3.3 ug/mL (ref >1.3)
Pneumo Ab Type 68 (9V)*: 3.4 ug/mL (ref >1.3)
Pneumo Ab Type 70 (33F)*: 3.3 ug/mL (ref >1.3)
Pneumo Ab Type 8*: 0.7 ug/mL — ABNORMAL LOW (ref >1.3)
Pneumo Ab Type 9 (9N)*: <0.1 ug/mL — ABNORMAL LOW (ref >1.3)

## 2024-01-14 LAB — CBC WITH DIFFERENTIAL/PLATELET
Basophils Absolute: 0.1 10*3/uL (ref 0.0–0.2)
Basos: 1 %
EOS (ABSOLUTE): 0.2 10*3/uL (ref 0.0–0.4)
Eos: 2 %
Hematocrit: 43.4 % (ref 34.0–46.6)
Hemoglobin: 14.4 g/dL (ref 11.1–15.9)
Immature Grans (Abs): 0 10*3/uL (ref 0.0–0.1)
Immature Granulocytes: 0 %
Lymphocytes Absolute: 3.3 10*3/uL — ABNORMAL HIGH (ref 0.7–3.1)
Lymphs: 35 %
MCH: 30 pg (ref 26.6–33.0)
MCHC: 33.2 g/dL (ref 31.5–35.7)
MCV: 90 fL (ref 79–97)
Monocytes Absolute: 0.8 10*3/uL (ref 0.1–0.9)
Monocytes: 8 %
Neutrophils Absolute: 5.2 10*3/uL (ref 1.4–7.0)
Neutrophils: 54 %
Platelets: 370 10*3/uL (ref 150–450)
RBC: 4.8 x10E6/uL (ref 3.77–5.28)
RDW: 12.5 % (ref 11.7–15.4)
WBC: 9.5 10*3/uL (ref 3.4–10.8)

## 2024-01-14 LAB — CMP14+EGFR
ALT: 13 IU/L (ref 0–32)
AST: 18 IU/L (ref 0–40)
Albumin: 4.6 g/dL (ref 3.8–4.9)
Alkaline Phosphatase: 108 IU/L (ref 44–121)
BUN/Creatinine Ratio: 32 — ABNORMAL HIGH (ref 9–23)
BUN: 21 mg/dL (ref 6–24)
Bilirubin Total: <0.2 mg/dL (ref 0.0–1.2)
CO2: 19 mmol/L — ABNORMAL LOW (ref 20–29)
Calcium: 9.7 mg/dL (ref 8.7–10.2)
Chloride: 103 mmol/L (ref 96–106)
Creatinine, Ser: 0.65 mg/dL (ref 0.57–1.00)
Globulin, Total: 2.2 g/dL (ref 1.5–4.5)
Glucose: 108 mg/dL — ABNORMAL HIGH (ref 70–99)
Potassium: 4.1 mmol/L (ref 3.5–5.2)
Sodium: 140 mmol/L (ref 134–144)
Total Protein: 6.8 g/dL (ref 6.0–8.5)
eGFR: 101 mL/min/{1.73_m2} (ref >59)

## 2024-01-14 LAB — COMPLEMENT, TOTAL: Compl, Total (CH50): 57 U/mL (ref >41)

## 2024-01-14 LAB — IGG, IGA, IGM
IgA/Immunoglobulin A, Serum: 195 mg/dL (ref 87–352)
IgG (Immunoglobin G), Serum: 933 mg/dL (ref 586–1602)
IgM (Immunoglobulin M), Srm: 155 mg/dL (ref 26–217)

## 2024-01-14 LAB — DIPHTHERIA / TETANUS ANTIBODY PANEL
Diphtheria Ab: 0.13 [IU]/mL (ref <0.10)
Tetanus Ab, IgG: 1.42 [IU]/mL (ref <0.10)

## 2024-01-15 ENCOUNTER — Ambulatory Visit: Payer: Self-pay | Admitting: Family Medicine

## 2024-01-15 NOTE — Progress Notes (Signed)
 Your immune workup was not normal, but we need to do further workup to figure you out. We first looked at immunoglobulins.  Immunoglobulins are proteins that bind to and neutralize bacteria and viruses. Your immunoglobulin levels were normal.  Next we checked your specific immunoglobulins to routine vaccinations.  You were protective against diphtheria. You were protective against tetanus.  We also looked at protection against a bacteria called Streptococcus pneumonia.  This is a bacteria that causes sinus infections, ear infections, and pneumonia.  You were protective to 9 out of 23 strains of Streptococcus pneumonia which was a mediocre response.  We also looked at complement activity.  Complement as a protein made by your liver which helps your immune system to work more efficiently.  This activity was normal. Suggest a PPSV23 vaccine and then recheck this one lab in 4-6 weeks. Thank you

## 2024-01-16 ENCOUNTER — Ambulatory Visit: Admitting: Allergy

## 2024-01-18 ENCOUNTER — Encounter: Payer: Self-pay | Admitting: Allergy

## 2024-01-18 ENCOUNTER — Ambulatory Visit: Admitting: Allergy

## 2024-01-18 DIAGNOSIS — J3089 Other allergic rhinitis: Secondary | ICD-10-CM | POA: Diagnosis not present

## 2024-01-18 DIAGNOSIS — T7800XD Anaphylactic reaction due to unspecified food, subsequent encounter: Secondary | ICD-10-CM

## 2024-01-18 DIAGNOSIS — J4540 Moderate persistent asthma, uncomplicated: Secondary | ICD-10-CM | POA: Diagnosis not present

## 2024-01-18 DIAGNOSIS — B999 Unspecified infectious disease: Secondary | ICD-10-CM

## 2024-01-18 DIAGNOSIS — Z91040 Latex allergy status: Secondary | ICD-10-CM

## 2024-01-18 MED ORDER — PNEUMOCOCCAL VAC POLYVALENT 25 MCG/0.5ML IJ SOLN: 0.5000 mL | Freq: Once | INTRAMUSCULAR | 0 refills | Status: AC

## 2024-01-18 NOTE — Patient Instructions (Addendum)
 Today's skin testing positive to cat and borderline to grass pollen.   Results given.  Environmental allergies Start environmental control measures as below. Use over the counter antihistamines such as Zyrtec (cetirizine), Claritin (loratadine), Allegra  (fexofenadine ), or Xyzal (levocetirizine) daily as needed. May take twice a day during allergy flares. May switch antihistamines every few months. Use Flonase  (fluticasone ) nasal spray 1-2 sprays per nostril once a day as needed for nasal congestion.  Nasal saline spray (i.e., Simply Saline) or nasal saline lavage (i.e., NeilMed) is recommended as needed and prior to medicated nasal sprays. Recommend allergy injections. 1 injection. Let us  know when ready to start.  Had a detailed discussion with patient/family that clinical history is suggestive of allergic rhinitis, and may benefit from allergy immunotherapy (AIT). Discussed in detail regarding the dosing, schedule, side effects (mild to moderate local allergic reaction and rarely systemic allergic reactions including anaphylaxis), and benefits (significant improvement in nasal symptoms, seasonal flares of asthma) of immunotherapy with the patient. There is significant time commitment involved with allergy shots, which includes weekly immunotherapy injections for first 9-12 months and then biweekly to monthly injections for 3-5 years. Consent was signed.  Asthma Daily controller medication(s): Symbicort  160mcg 2 puffs twice a day with spacer and rinse mouth afterwards. May use albuterol  rescue inhaler 2 puffs every 4 to 6 hours as needed for shortness of breath, chest tightness, coughing, and wheezing. May use albuterol  rescue inhaler 2 puffs 5 to 15 minutes prior to strenuous physical activities. Monitor frequency of use - if you need to use it more than twice per week on a consistent basis let us  know.  Breathing control goals:  Full participation in all desired activities (may need albuterol   before activity) Albuterol  use two times or less a week on average (not counting use with activity) Cough interfering with sleep two times or less a month Oral steroids no more than once a year No hospitalizations   Infections Keep track of infections and antibiotics use. Your pneumococcal titers were low. Sometimes people with low titers are more likely to develop respiratory infections caused by the bacteria strep pneumoniae. I would like for you to get the pneumovax 23 vaccine (also known as the pneumonia shot) as it can boost the levels and offer protection against this bacteria in the future. Once you get the vaccine, we check the levels 4 weeks afterwards to make sure your immune system responded to the vaccine appropriately. You can get the pneumovax vaccine at your PCP's office or pharmacy. If they don't offer it there, let us  know and in certain cases we have given them in our office.  Make sure it's the pneumovax 23 vaccine and NOT the prevnar.   Food allergy Continue to avoid foods dyes. For mild symptoms you can take over the counter antihistamines (zyrtec 10mg  to 20mg ) and monitor symptoms closely.  If symptoms worsen or if you have severe symptoms including breathing issues, throat closure, significant swelling, whole body hives, severe diarrhea and vomiting, lightheadedness then use epinephrine  and seek immediate medical care afterwards. Emergency action plan in place.   Latex allergy Continue to avoid.  Return in about 6 months (around 07/19/2024). Or sooner if needed.   Pet Allergen Avoidance: Contrary to popular opinion, there are no "hypoallergenic" breeds of dogs or cats. That is because people are not allergic to an animal's hair, but to an allergen found in the animal's saliva, dander (dead skin flakes) or urine. Pet allergy symptoms typically occur within minutes. For  some people, symptoms can build up and become most severe 8 to 12 hours after contact with the animal.  People with severe allergies can experience reactions in public places if dander has been transported on the pet owners' clothing. Keeping an animal outdoors is only a partial solution, since homes with pets in the yard still have higher concentrations of animal allergens. Before getting a pet, ask your allergist to determine if you are allergic to animals. If your pet is already considered part of your family, try to minimize contact and keep the pet out of the bedroom and other rooms where you spend a great deal of time. As with dust mites, vacuum carpets often or replace carpet with a hardwood floor, tile or linoleum. High-efficiency particulate air (HEPA) cleaners can reduce allergen levels over time. While dander and saliva are the source of cat and dog allergens, urine is the source of allergens from rabbits, hamsters, mice and [place] pigs; so ask a non-allergic family member to clean the animal's cage. If you have a pet allergy, talk to your allergist about the potential for allergy immunotherapy (allergy shots). This strategy can often provide long-term relief.  Reducing Pollen Exposure Pollen seasons: trees (spring), grass (summer) and ragweed/weeds (fall). Keep windows closed in your home and car to lower pollen exposure.  Install air conditioning in the bedroom and throughout the house if possible.  Avoid going out in dry windy days - especially early morning. Pollen counts are highest between 5 - 10 AM and on dry, hot and windy days.  Save outside activities for late afternoon or after a heavy rain, when pollen levels are lower.  Avoid mowing of grass if you have grass pollen allergy. Be aware that pollen can also be transported indoors on people and pets.  Dry your clothes in an automatic dryer rather than hanging them outside where they might collect pollen.  Rinse hair and eyes before bedtime.

## 2024-01-18 NOTE — Progress Notes (Signed)
 Skin testing note  RE: Samantha Delacruz MRN: [id number] DOB: 05-30-1966 Date of Office Visit: 01/18/2024  Referring provider: Christel Cousins, MD Primary care provider: Christel Cousins, MD  Chief Complaint: skin testing  History of Present Illness: I had the pleasure of seeing Samantha Delacruz for a skin testing visit at the [facility] on 01/18/2024. She is a 58 y.o. female, who is being followed for asthma, allergic rhinitis, food allergy, latex allergy and recurrent sinusitis asthma, allergic rhinitis, food allergy, latex allergy and recurrent sinusitis. Her previous allergy office visit was on 12/19/2023 with Marinus Sic, FNP. Today is a skin testing visit.   Discussed the use of AI scribe software for clinical note transcription with the patient, who gave verbal consent to proceed.    She recently took Flexeril  and inquires if it would affect the testing - no. She has not had a spirometry test in three years. She received a call from the asthma center regarding abnormal blood work results and was advised to discuss these with the provider.  She has a history of shingles since the age of 25 and also has herpes. She received a pneumonia shot within the last three years but is experiencing recurrent infections. She has difficulty remembering past medical events due to ADHD and a traumatic brain injury.  She is allergic to cats and grass pollen and is currently managing her allergies with medications, including nose sprays. She has 1 cat at home.  Discussed labs and plan.     Assessment and Plan: Samantha Delacruz is a 58 y.o. female with: Other allergic rhinitis 1 cat at home.  Today's skin testing positive to cat and borderline to grass pollen.  Start environmental control measures as below. Use over the counter antihistamines such as Zyrtec (cetirizine), Claritin (loratadine), Allegra  (fexofenadine ), or Xyzal (levocetirizine) daily as needed. May take twice a day  during allergy flares. May switch antihistamines every few months. Use Flonase  (fluticasone ) nasal spray 1-2 sprays per nostril once a day as needed for nasal congestion.  Nasal saline spray (i.e., Simply Saline) or nasal saline lavage (i.e., NeilMed) is recommended as needed and prior to medicated nasal sprays. Recommend allergy injections. 1 injection. Let us  know when ready to start.  Had a detailed discussion with patient/family that clinical history is suggestive of allergic rhinitis, and may benefit from allergy immunotherapy (AIT). Discussed in detail regarding the dosing, schedule, side effects (mild to moderate local allergic reaction and rarely systemic allergic reactions including anaphylaxis), and benefits (significant improvement in nasal symptoms, seasonal flares of asthma) of immunotherapy with the patient. There is significant time commitment involved with allergy shots, which includes weekly immunotherapy injections for first 9-12 months and then biweekly to monthly injections for 3-5 years. Consent was signed.  Moderate persistent asthma without complication Today's spirometry was normal. Daily controller medication(s): Symbicort  160mcg 2 puffs twice a day with spacer and rinse mouth afterwards. May use albuterol  rescue inhaler 2 puffs every 4 to 6 hours as needed for shortness of breath, chest tightness, coughing, and wheezing. May use albuterol  rescue inhaler 2 puffs 5 to 15 minutes prior to strenuous physical activities. Monitor frequency of use - if you need to use it more than twice per week on a consistent basis let us  know.   Recurrent infections Keep track of infections and antibiotics use. Your pneumococcal titers were low. Sometimes people with low titers are more likely to develop respiratory infections caused by the bacteria strep pneumoniae.  I would like for you to get the pneumovax 23 vaccine (also known as the pneumonia shot) as it can boost the levels and offer  protection against this bacteria in the future. Once you get the vaccine, we check the levels 4 weeks afterwards to make sure your immune system responded to the vaccine appropriately. You can get the pneumovax vaccine at your PCP's office or pharmacy. If they don't offer it there, let us  know and in certain cases we have given them in our office.  Make sure it's the pneumovax 23 vaccine and NOT the prevnar.   Latex allergy Continue to avoid.  Anaphylactic reaction due to food, subsequent encounter Continue to avoid foods dyes. For mild symptoms you can take over the counter antihistamines (zyrtec 10mg  to 20mg ) and monitor symptoms closely.  If symptoms worsen or if you have severe symptoms including breathing issues, throat closure, significant swelling, whole body hives, severe diarrhea and vomiting, lightheadedness then use epinephrine  and seek immediate medical care afterwards. Emergency action plan in place.   Return in about 6 months (around 07/19/2024).  Meds ordered this encounter  Medications   pneumococcal 23 valent vaccine (PNEUMOVAX-23) 25 MCG/0.5ML injection    Sig: Inject 0.5 mLs into the muscle once for 1 dose.    Dispense:  0.5 mL    Refill:  0   Lab Orders         Strep pneumoniae 23 Serotypes IgG      Diagnostics: Spirometry:  Tracings reviewed. Her effort: Good reproducible efforts. FVC: 3.80L FEV1: 2.92L, 108% predicted FEV1/FVC ratio: 77% Interpretation: Spirometry consistent with normal pattern.  Please see scanned spirometry results for details.  Skin Testing: Environmental allergy panel. Today's skin testing positive to cat and borderline to grass pollen.  Results discussed with patient/family.  Airborne Adult Perc - 01/18/24 1444     Time Antigen Placed 1444    Allergen Manufacturer Floyd Hutchinson    Location Back    Number of Test 55    1. Control-Buffer 50% Glycerol Negative    2. Control-Histamine 3+    3. Bahia Negative    4. [place] Negative    5.  Johnson Negative    6. [place]  Blue Negative    7. Meadow Fescue Negative    8. Perennial Rye Negative    9. Timothy Negative    10. Ragweed Mix Negative    11. Cocklebur Negative    12. Plantain,  English Negative    13. Baccharis Negative    14. Dog Fennel Negative    15. Russian Thistle Negative    16. Lamb's Quarters Negative    17. Sheep Sorrell Negative    18. Rough Pigweed Negative    19. Marsh Elder, Rough Negative    20. Mugwort, Common Negative    21. Box, Elder Negative    22. Cedar, red Negative    23. Sweet Gum Negative    24. Pecan Pollen Negative    25. Pine Mix Negative    26. Walnut, Black Pollen Negative    27. Red Mulberry Negative    28. Ash Mix Negative    29. Birch Mix Negative    30. Beech American Negative    31. Cottonwood, [place] Negative    32. Hickory, White Negative    33. Maple Mix Negative    34. Oak, [place] Mix Negative    35. Sycamore Eastern Negative    36. Alternaria Alternata Negative    37. Cladosporium Herbarum Negative  38. Aspergillus Mix Negative    39. Penicillium Mix Negative    40. Bipolaris Sorokiniana (Helminthosporium) Negative    41. Drechslera Spicifera (Curvularia) Negative    42. Mucor Plumbeus Negative    43. Fusarium Moniliforme Negative    44. Aureobasidium Pullulans (pullulara) Negative    45. Rhizopus Oryzae Negative    46. Botrytis Cinera Negative    47. Epicoccum Nigrum Negative    48. Phoma Betae Negative    49. Dust Mite Mix Negative    50. Cat Hair 10,000 BAU/ml Negative    51.  Dog Epithelia Negative    52. Mixed Feathers Negative    53. Horse Epithelia Negative    54. Cockroach, German Negative    55. Tobacco Leaf Negative             Intradermal - 01/18/24 1513     Time Antigen Placed 1513    Allergen Manufacturer Floyd Hutchinson    Location Arm    Number of Test 16    Control Negative    [place] --   +/-   [place] --   +/-   Johnson Negative    7 Grass Negative    Ragweed Mix Negative     Weed Mix Negative    Tree Mix Negative    Mold 1 Negative    Mold 2 Negative    Mold 3 Negative    Mold 4 Negative    Mite Mix Negative    Cat 3+    Dog Negative    Cockroach Negative             Previous notes and tests were reviewed. The plan was reviewed with the patient/family, and all questions/concerned were addressed.  It was my pleasure to see Samantha Delacruz today and participate in her care. Please feel free to contact me with any questions or concerns.  Sincerely,  Eudelia Hero, DO Allergy & Immunology  [facility]  [facility] office: [phone] [facility] office: [phone]

## 2024-01-22 ENCOUNTER — Encounter: Payer: Self-pay | Admitting: Physical Therapy

## 2024-01-22 ENCOUNTER — Ambulatory Visit: Admitting: Physical Therapy

## 2024-01-22 DIAGNOSIS — M6281 Muscle weakness (generalized): Secondary | ICD-10-CM

## 2024-01-22 DIAGNOSIS — M5459 Other low back pain: Secondary | ICD-10-CM | POA: Diagnosis not present

## 2024-01-22 NOTE — Therapy (Signed)
 OUTPATIENT PHYSICAL THERAPY THORACOLUMBAR TREATMENT   Patient Name: Samantha Delacruz MRN: [id number] DOB:12-Jun-1966, 58 y.o., female Today's Date: 01/22/2024  END OF SESSION:  PT End of Session - 01/22/24 1611     Visit Number 2    Number of Visits 16    Date for PT Re-Evaluation 03/28/24    PT Start Time 1611   late to check in   PT Stop Time 1649    PT Time Calculation (min) 38 min    Activity Tolerance Patient tolerated treatment well    Behavior During Therapy Anxious              Past Medical History:  Diagnosis Date   Adult ADHD    Anemia    history of   Angio-edema    Asthma    Brain injury (HCC)    Headache    Migraines   Heart palpitations    Hemorrhoids, internal, with bleeding and mucous discharge - Grade 1 02/05/2014   Herpes    Hypertension    Migraines    Pneumonia    PTSD (post-traumatic stress disorder)    has a stalker   Recurrent infections 12/20/2023   Restless leg    Rosacea    Shingles    Urticaria    Vertigo    Past Surgical History:  Procedure Laterality Date   CESAREAN SECTION  08/15/1992   son   COLONOSCOPY     DILATATION & CURETTAGE/HYSTEROSCOPY WITH MYOSURE N/A 04/19/2016   Procedure: DILATATION & CURETTAGE/HYSTEROSCOPY WITH MYOSURE;  Surgeon: Lacretia Piccolo, MD;  Location: [facility];  Service: Gynecology;  Laterality: N/A;  request 7:30am start time   Requests one hour OR time.    HEMORRHOID BANDING     retinal peel left eye surgery Left    retina and cataract   thrush     Patient Active Problem List   Diagnosis Date Noted   Recurrent infections 12/20/2023   Seasonal and perennial allergic rhinitis 12/20/2023   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   Gastroesophageal reflux disease without esophagitis 11/08/2023   Prediabetes 11/08/2023   HSV (herpes simplex virus) anogenital infection 01/24/2023   Anxiety 11/14/2021   Uterine leiomyoma 11/14/2021   Anemia 11/09/2021   Angioedema 11/09/2021   Urticaria  11/09/2021   Attention deficit hyperactivity disorder 11/09/2021   Cyst of left breast 11/09/2021   Hemorrhoids 11/09/2021   Migraine 11/09/2021   Restless legs 11/09/2021   Rosacea 11/09/2021   Traumatic brain injury (HCC) 11/09/2021   Other adverse food reactions, not elsewhere classified, subsequent encounter 01/04/2021   Adverse effect of other drugs, medicaments and biological substances, subsequent encounter 01/04/2021   Asthma 01/04/2021   HTN (hypertension) 05/14/2019   Right hip pain 08/21/2014   Palpitations 08/04/2014   Latex allergy 02/05/2014   Rectal bleeding 12/10/2013   Allergic rhinitis 09/26/2013   Benign paroxysmal positional vertigo 06/20/2013   Seborrheic keratosis, inflamed 05/23/2013   Fibrocystic breast changes 10/02/2012   Overweight 10/02/2012    PCP: Christel Cousins, MD  REFERRING PROVIDER: Anthon Kins, MD  REFERRING DIAG: M54.40 (ICD-10-CM) - Back pain of lumbar region with sciatica  Rationale for Evaluation and Treatment: Rehabilitation  THERAPY DIAG:  Other low back pain  Muscle weakness (generalized)  ONSET DATE: a couple weeks  SUBJECTIVE:  SUBJECTIVE STATEMENT: 01/22/2024 States she is doing better. Been moving boxes and things and upper back is bothering her. States she drove to [place] and drove there and back and she had pain in the middle back as if her vertebrae pain and electrical impulses in the mid back on both sides of the ribs. It has not happened before  and has not happened since.    EVAL: States she has had sinus issues and has not been very active. States she was trying to get back to exercises with piliates, aerobics and PE class where she was bending a lot and she started having intense pain after a lot of bending. States she thought she  had kidney stones so she got a CT which didn't find much. Her pain was so severe she couldn't walk. She has had on and off pain over the years but never like this.   Previously she thought her pain was coming from her hips. She had some SIJ injections and one helped a lot and another did not. States that she thinks she needs to change her sleeping positions because she sleeps in a twisted position. She wants to avoid surgery. No numbness or tingling noted on this date. History of tingling in legs but thinks it's a vein issue.   PERTINENT HISTORY:  Migraines, cesarean, TBI history, incontinence   PAIN:  Are you having pain? Yes: NPRS scale:  current 2/10 Pain location: mid back Pain description: across back, awareness  Aggravating factors: bending, being still, stretching Relieving factors: standing, laying, sitting  PRECAUTIONS: None  RED FLAGS: None   WEIGHT BEARING RESTRICTIONS: No  FALLS:  Has patient fallen in last 6 months? 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   NEXT MD VISIT: June 14th  OBJECTIVE:  Note: Objective measures were completed at Evaluation unless otherwise noted.  DIAGNOSTIC FINDINGS:  12/25/23 Lumbar MRI  IMPRESSION: 1. Moderate levoconvex curvature of the lumbar spine centered at L3-4. 2. Mild foraminal narrowing bilaterally at L2-3. 3. Moderate foraminal stenosis bilaterally at L3-4 left greater than right. 4. Mild right foraminal stenosis at L4-5. 5. Shallow central disc protrusion at L5-S1 without stenosis. 6. Type 1 edematous endplate marrow changes on the right at L2-3 and L3-4. This likely contributes to low back pain.  PATIENT SURVEYS:  Patient-specific activity functional scoring scheme (Point to one number):  "0" represents "unable to perform." "10" represents "able to perform at prior level. 0 1 2 3 4 5  6 7 8 9 10  (Date and Score) Activity Initial  Activity Eval     Driving > 1 hr  0    Climbing stairs   5    Walking across room 6    Additional Additional Total score = sum of the activity scores/number of activities Minimum detectable change (90%CI) for average score = 2 points Minimum detectable change (90%CI) for single activity score = 3 points PSFS developed by: [place] Spitz., & Binkley, J. (1995). Assessing disability and change on individual  patients: a report of a patient specific measure. Physiotherapy [place], [phone], [phone]. Reproduced with the permission of the authors  Score: 11/3=3.66   COGNITION: Overall cognitive status: History of cognitive impairments - at baseline per patient with history of TBI    SENSATION: Not tested     POSTURE: rounded shoulders, forward  head, and flexed trunk   PALPATION: Increased resting tone and pain with palpation along right glutes and left lumbar paraspinals  LUMBAR ROM:   AROM eval  Flexion 50% limited *  Extension 90% limited *  Right lateral flexion 50 %limited burning pain*  Left lateral flexion 75% limited*  Right rotation   Left rotation    (Blank rows = not tested) pain    LE Measurements Lower Extremity Right EVAL Left EVAL   A/PROM MMT A/PROM MMT  Hip Flexion WFL 3+ WFL 4*  Hip Extension WFL 4- WFL 4-  Hip Abduction      Hip Adduction      Hip Internal rotation WFL*  WFL*   Hip External rotation [facility]  WFL   Knee Flexion  4  4  Knee Extension  4+  4+  Ankle Dorsiflexion  4+  4+  Ankle Plantarflexion      Ankle Inversion      Ankle Eversion       (Blank rows = not tested) * pain   LUMBAR SPECIAL TESTS:   SLUMP neg B SLR neg B  FUNCTIONAL TESTS:  Pain and breath holding with all bed mobilities      TREATMENT DATE:                                                                                                                               01/22/2024  Therapeutic  Exercise:  Aerobic: Supine: 90/90 not tolerated abdominal discomfort, supportive hook lying horizontal shoulder abd 1 minutes bout x2 rounds, shoulder flexion 1 minute bout and x2 rounds, scapular protraction 1 minted B, LTR 2.5 minutes  Prone:   Seated: scapular retraction x15 5"holds   Standing: Neuromuscular Re-education:on exhaling with motion- reviewed, posture education seated and in car - with lumbar support and towel  Manual Therapy: Therapeutic Activity: Self Care: Trigger Point Dry Needling:  Modalities:     PATIENT EDUCATION:  Education details: on HEP, on anatomy, on gentle progression of exercises and return to sport.  Person educated: Patient Education method: Explanation, Demonstration, and Handouts Education comprehension: verbalized understanding   HOME EXERCISE PROGRAM: [id number]  ASSESSMENT:  CLINICAL IMPRESSION: 01/22/2024 Focused on mid to upper back secondary to increased discomfort hear with recent prone exercises and increased lifting at work with move of their building. Tolerated exercises well. Fatigue noted but no increase in pain. Session limited secondary to late check-in and patient following up with front office about questions concerning appointment.    EVAL: Patient presents to PT with complaints of chronic low back pain and recent episodic flare up that limited her ability to walk and move at home.  Patient presents with posture, strength and mobility flare ups that are likely contributing to current presentation. No saddle symptoms or incontinence noted on this date, will continue to monitor secondary to history of incontinence. Patient would greatly benefit from skilled PT to improve overall function and QOL.  OBJECTIVE IMPAIRMENTS: decreased activity tolerance, decreased mobility, difficulty walking, decreased ROM, decreased strength, improper body mechanics, postural dysfunction, and pain.   ACTIVITY LIMITATIONS: lifting, bending, stairs, bed  mobility, and locomotion level  PARTICIPATION LIMITATIONS: community activity and occupation  PERSONAL FACTORS: Age, Past/current experiences, and 1-2 comorbidities: hx of TBI, hx of incontinence  are also affecting patient's functional outcome.   REHAB POTENTIAL: Good  CLINICAL DECISION MAKING: Stable/uncomplicated  EVALUATION COMPLEXITY: Low   GOALS: Goals reviewed with patient? yes  SHORT TERM GOALS: Target date: 02/15/2024   Patient will be independent in self management strategies to improve quality of life and functional outcomes. Baseline: New Program Goal status: INITIAL  2.  Patient will report at least 50% improvement in overall symptoms and/or function to demonstrate improved functional mobility Baseline: 0% better Goal status: INITIAL  3.  Patient will report lying on stomach for a few minutes daily to help with decompression of lumbar spine  Baseline: not currently Goal status: INITIAL      LONG TERM GOALS: Target date: 03/28/2024    Patient will report at least 75% improvement in overall symptoms and/or function to demonstrate improved functional mobility Baseline: 0% better Goal status: INITIAL  2.  Patient will score at least 2 points higher on PSFS average to demonstrate change in overall function. Baseline: see above Goal status: INITIAL  3.  Patient will be able to demonstrate proper bending mechanics to reduce risk of re-injury Baseline: not currently Goal status: INITIAL     PLAN:  PT FREQUENCY: 1-2x/week for a total of 16 visits over 12 week certification period   PT DURATION: 12 weeks  PLANNED INTERVENTIONS: 97110-Therapeutic exercises, 97530- Therapeutic activity, 97112- Neuromuscular re-education, 97535- Self Care, 65784- Manual therapy, [phone]- Gait training, [phone]- Orthotic Fit/training, [phone]- Canalith repositioning, [id number]- Aquatic Therapy, [id number]- Electrical stimulation (unattended), [phone]- Ionotophoresis 4mg /ml Dexamethasone ,  Patient/Family education, Balance training, Stair training, Taping, Dry Needling, Joint mobilization, Joint manipulation, Spinal manipulation, Spinal mobilization, Cryotherapy, and Moist heat   PLAN FOR NEXT SESSION: f/u with prone extension series, stability exercises, LE/core strength, LTR, try manual 90/90 relief if painful  likes printouts   4:58 PM, 01/22/24 Tabitha Ewings, DPT Physical Therapy [facility]

## 2024-01-23 ENCOUNTER — Ambulatory Visit

## 2024-01-29 ENCOUNTER — Ambulatory Visit: Payer: Self-pay | Admitting: Family Medicine

## 2024-01-29 ENCOUNTER — Encounter: Payer: Self-pay | Admitting: Physical Therapy

## 2024-01-29 ENCOUNTER — Ambulatory Visit (INDEPENDENT_AMBULATORY_CARE_PROVIDER_SITE_OTHER)

## 2024-01-29 ENCOUNTER — Ambulatory Visit (INDEPENDENT_AMBULATORY_CARE_PROVIDER_SITE_OTHER): Admitting: Physical Therapy

## 2024-01-29 ENCOUNTER — Other Ambulatory Visit (INDEPENDENT_AMBULATORY_CARE_PROVIDER_SITE_OTHER)

## 2024-01-29 DIAGNOSIS — R7303 Prediabetes: Secondary | ICD-10-CM

## 2024-01-29 DIAGNOSIS — D729 Disorder of white blood cells, unspecified: Secondary | ICD-10-CM

## 2024-01-29 DIAGNOSIS — M6281 Muscle weakness (generalized): Secondary | ICD-10-CM | POA: Diagnosis not present

## 2024-01-29 DIAGNOSIS — M5459 Other low back pain: Secondary | ICD-10-CM | POA: Diagnosis not present

## 2024-01-29 DIAGNOSIS — Z23 Encounter for immunization: Secondary | ICD-10-CM

## 2024-01-29 LAB — CBC WITH DIFFERENTIAL/PLATELET
Basophils Absolute: 0 10*3/uL (ref 0.0–0.1)
Basophils Relative: 0.6 % (ref 0.0–3.0)
Eosinophils Absolute: 0.2 10*3/uL (ref 0.0–0.7)
Eosinophils Relative: 2.2 % (ref 0.0–5.0)
HCT: 42 % (ref 36.0–46.0)
Hemoglobin: 14.1 g/dL (ref 12.0–15.0)
Lymphocytes Relative: 34 % (ref 12.0–46.0)
Lymphs Abs: 2.5 10*3/uL (ref 0.7–4.0)
MCHC: 33.6 g/dL (ref 30.0–36.0)
MCV: 86.6 fl (ref 78.0–100.0)
Monocytes Absolute: 0.7 10*3/uL (ref 0.1–1.0)
Monocytes Relative: 9 % (ref 3.0–12.0)
Neutro Abs: 3.9 10*3/uL (ref 1.4–7.7)
Neutrophils Relative %: 54.2 % (ref 43.0–77.0)
Platelets: 304 10*3/uL (ref 150.0–400.0)
RBC: 4.85 Mil/uL (ref 3.87–5.11)
RDW: 12.8 % (ref 11.5–15.5)
WBC: 7.2 10*3/uL (ref 4.0–10.5)

## 2024-01-29 LAB — HEMOGLOBIN A1C: Hgb A1c MFr Bld: 5.9 % (ref 4.6–6.5)

## 2024-01-29 NOTE — Therapy (Signed)
 OUTPATIENT PHYSICAL THERAPY THORACOLUMBAR TREATMENT   Patient Name: Samantha Delacruz MRN: [id number] DOB:1966/04/03, 58 y.o., female Today's Date: 01/29/2024  END OF SESSION:  PT End of Session - 01/29/24 0931     Visit Number 3    Number of Visits 16    Date for PT Re-Evaluation 03/28/24    PT Start Time 0932    PT Stop Time 1010    PT Time Calculation (min) 38 min    Activity Tolerance Patient tolerated treatment well    Behavior During Therapy Anxious           Past Medical History:  Diagnosis Date   Adult ADHD    Anemia    history of   Angio-edema    Asthma    Brain injury (HCC)    Headache    Migraines   Heart palpitations    Hemorrhoids, internal, with bleeding and mucous discharge - Grade 1 02/05/2014   Herpes    Hypertension    Migraines    Pneumonia    PTSD (post-traumatic stress disorder)    has a stalker   Recurrent infections 12/20/2023   Restless leg    Rosacea    Shingles    Urticaria    Vertigo    Past Surgical History:  Procedure Laterality Date   CESAREAN SECTION  08/15/1992   son   COLONOSCOPY     DILATATION & CURETTAGE/HYSTEROSCOPY WITH MYOSURE N/A 04/19/2016   Procedure: DILATATION & CURETTAGE/HYSTEROSCOPY WITH MYOSURE;  Surgeon: Lacretia Piccolo, MD;  Location: [facility];  Service: Gynecology;  Laterality: N/A;  request 7:30am start time   Requests one hour OR time.    HEMORRHOID BANDING     retinal peel left eye surgery Left    retina and cataract   thrush     Patient Active Problem List   Diagnosis Date Noted   Recurrent infections 12/20/2023   Seasonal and perennial allergic rhinitis 12/20/2023   Allergy  with anaphylaxis due to food 12/20/2023   Gastroesophageal reflux disease without esophagitis 11/08/2023   Prediabetes 11/08/2023   HSV (herpes simplex virus) anogenital infection 01/24/2023   Anxiety 11/14/2021   Uterine leiomyoma 11/14/2021   Anemia 11/09/2021   Angioedema 11/09/2021   Urticaria 11/09/2021    Attention deficit hyperactivity disorder 11/09/2021   Cyst of left breast 11/09/2021   Hemorrhoids 11/09/2021   Migraine 11/09/2021   Restless legs 11/09/2021   Rosacea 11/09/2021   Traumatic brain injury (HCC) 11/09/2021   Other adverse food reactions, not elsewhere classified, subsequent encounter 01/04/2021   Adverse effect of other drugs, medicaments and biological substances, subsequent encounter 01/04/2021   Asthma 01/04/2021   HTN (hypertension) 05/14/2019   Right hip pain 08/21/2014   Palpitations 08/04/2014   Latex allergy  02/05/2014   Rectal bleeding 12/10/2013   Allergic rhinitis 09/26/2013   Benign paroxysmal positional vertigo 06/20/2013   Seborrheic keratosis, inflamed 05/23/2013   Fibrocystic breast changes 10/02/2012   Overweight 10/02/2012    PCP: Christel Cousins, MD  REFERRING PROVIDER: Anthon Kins, MD  REFERRING DIAG: M54.40 (ICD-10-CM) - Back pain of lumbar region with sciatica  Rationale for Evaluation and Treatment: Rehabilitation  THERAPY DIAG:  Other low back pain  Muscle weakness (generalized)  ONSET DATE: a couple weeks  SUBJECTIVE:  SUBJECTIVE STATEMENT: 01/29/2024 States that the neurosurgeon and he clared her to do all the things. States she still has a compression issue. States that the lumbar support is really helpful. Right hand side of spine tightness, low back is tired.   EVAL: States she has had sinus issues and has not been very active. States she was trying to get back to exercises with piliates, aerobics and PE class where she was bending a lot and she started having intense pain after a lot of bending. States she thought she had kidney stones so she got a CT which didn't find much. Her pain was so severe she couldn't walk. She has had on and off  pain over the years but never like this.   Previously she thought her pain was coming from her hips. She had some SIJ injections and one helped a lot and another did not. States that she thinks she needs to change her sleeping positions because she sleeps in a twisted position. She wants to avoid surgery. No numbness or tingling noted on this date. History of tingling in legs but thinks it's a vein issue.   PERTINENT HISTORY:  Migraines, cesarean, TBI history, incontinence   PAIN:  Are you having pain? Yes: NPRS scale:  current 1/10 Pain location: mid back Pain description: across back, awareness  Aggravating factors: bending, being still, stretching Relieving factors: standing, laying, sitting  PRECAUTIONS: None  RED FLAGS: None   WEIGHT BEARING RESTRICTIONS: No  FALLS:  Has patient fallen in last 6 months? 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   NEXT MD VISIT: June 14th  OBJECTIVE:  Note: Objective measures were completed at Evaluation unless otherwise noted.  DIAGNOSTIC FINDINGS:  12/25/23 Lumbar MRI  IMPRESSION: 1. Moderate levoconvex curvature of the lumbar spine centered at L3-4. 2. Mild foraminal narrowing bilaterally at L2-3. 3. Moderate foraminal stenosis bilaterally at L3-4 left greater than right. 4. Mild right foraminal stenosis at L4-5. 5. Shallow central disc protrusion at L5-S1 without stenosis. 6. Type 1 edematous endplate marrow changes on the right at L2-3 and L3-4. This likely contributes to low back pain.  PATIENT SURVEYS:  Patient-specific activity functional scoring scheme (Point to one number):  0 represents "unable to perform." 10 represents "able to perform at prior level. 0 1 2 3 4 5 6 7 8 9  10 (Date and Score) Activity Initial  Activity Eval     Driving > 1 hr  0    Climbing stairs   5    Walking  across room 6    Additional Additional Total score = sum of the activity scores/number of activities Minimum detectable change (90%CI) for average score = 2 points Minimum detectable change (90%CI) for single activity score = 3 points PSFS developed by: [place] Spitz., & Binkley, J. (1995). Assessing disability and change on individual  patients: a report of a patient specific measure. Physiotherapy [place], 47, [phone]. Reproduced with the permission of the authors  Score: 11/3=3.66   COGNITION: Overall cognitive status: History of cognitive impairments - at baseline per patient with history of TBI    SENSATION: Not tested     POSTURE: rounded shoulders, forward head, and flexed trunk   PALPATION: Increased resting tone and pain with palpation along right glutes and left lumbar paraspinals  LUMBAR ROM:  AROM eval  Flexion 50% limited *  Extension 90% limited *  Right lateral flexion 50 %limited burning pain*  Left lateral flexion 75% limited*  Right rotation   Left rotation    (Blank rows = not tested) pain    LE Measurements Lower Extremity Right EVAL Left EVAL   A/PROM MMT A/PROM MMT  Hip Flexion WFL 3+ WFL 4*  Hip Extension WFL 4- WFL 4-  Hip Abduction      Hip Adduction      Hip Internal rotation WFL*  WFL*   Hip External rotation [facility]  WFL   Knee Flexion  4  4  Knee Extension  4+  4+  Ankle Dorsiflexion  4+  4+  Ankle Plantarflexion      Ankle Inversion      Ankle Eversion       (Blank rows = not tested) * pain   LUMBAR SPECIAL TESTS:   SLUMP neg B SLR neg B  FUNCTIONAL TESTS:  Pain and breath holding with all bed mobilities      TREATMENT DATE:                                                                                                                               01/29/2024  Therapeutic Exercise: Review of entire HEP, PT plan Supine: laying on towel roll down spine 5 minutes, rocking side to side 2 minute,  horizontal shoulder abd 2 minutes. LTR 2 minutes, figure 4 rocking back and fourth left 1.5 minutes, piriformis stretch R x2 30 holds IR and ER  Prone:   Seated:    Standing: Neuromuscular Re-education:on posture/position and different types of pillows to try and what to look for 8 minutes Manual Therapy: Therapeutic Activity: Self Care: Trigger Point Dry Needling:  Modalities:     PATIENT EDUCATION:  Education details: on HEP, on anatomy, on rationale behind interventions Person educated: Patient Education method: Explanation, Demonstration, and Handouts Education comprehension: verbalized understanding   HOME EXERCISE PROGRAM: [id number]  ASSESSMENT:  CLINICAL IMPRESSION: 01/29/2024  Tolerated session well. Focused on mobility and progression of exercises. Tolerated everything well with reduced tension noted end of session. Added all new exercises to HEP. Answered all questions and patient will continue to benefit from skilled PT.    EVAL: Patient presents to PT with complaints of chronic low back pain and recent episodic flare up that limited her ability to walk and move at home.  Patient presents with posture, strength and mobility flare ups that are likely contributing to current presentation. No saddle symptoms or incontinence noted on this date, will continue to monitor secondary to history of incontinence. Patient would greatly benefit from skilled PT to improve overall function and QOL.   OBJECTIVE IMPAIRMENTS: decreased activity tolerance, decreased mobility, difficulty walking, decreased ROM, decreased strength, improper body mechanics, postural dysfunction, and pain.   ACTIVITY LIMITATIONS: lifting, bending, stairs, bed mobility, and locomotion level  PARTICIPATION LIMITATIONS: community activity  and occupation  PERSONAL FACTORS: Age, Past/current experiences, and 1-2 comorbidities: hx of TBI, hx of incontinence  are also affecting patient's functional outcome.    REHAB POTENTIAL: Good  CLINICAL DECISION MAKING: Stable/uncomplicated  EVALUATION COMPLEXITY: Low   GOALS: Goals reviewed with patient? yes  SHORT TERM GOALS: Target date: 02/15/2024   Patient will be independent in self management strategies to improve quality of life and functional outcomes. Baseline: New Program Goal status: INITIAL  2.  Patient will report at least 50% improvement in overall symptoms and/or function to demonstrate improved functional mobility Baseline: 0% better Goal status: INITIAL  3.  Patient will report lying on stomach for a few minutes daily to help with decompression of lumbar spine  Baseline: not currently Goal status: INITIAL      LONG TERM GOALS: Target date: 03/28/2024    Patient will report at least 75% improvement in overall symptoms and/or function to demonstrate improved functional mobility Baseline: 0% better Goal status: INITIAL  2.  Patient will score at least 2 points higher on PSFS average to demonstrate change in overall function. Baseline: see above Goal status: INITIAL  3.  Patient will be able to demonstrate proper bending mechanics to reduce risk of re-injury Baseline: not currently Goal status: INITIAL     PLAN:  PT FREQUENCY: 1-2x/week for a total of 16 visits over 12 week certification period   PT DURATION: 12 weeks  PLANNED INTERVENTIONS: 97110-Therapeutic exercises, 97530- Therapeutic activity, 97112- Neuromuscular re-education, 97535- Self Care, 82956- Manual therapy, [phone]- Gait training, [phone]- Orthotic Fit/training, [phone]- Canalith repositioning, [id number]- Aquatic Therapy, 97014- Electrical stimulation (unattended), [phone]- Ionotophoresis 4mg /ml Dexamethasone , Patient/Family education, Balance training, Stair training, Taping, Dry Needling, Joint mobilization, Joint manipulation, Spinal manipulation, Spinal mobilization, Cryotherapy, and Moist heat   PLAN FOR NEXT SESSION: f/u with prone extension series, stability  exercises, LE/core strength, LTR, try manual 90/90 relief if painful  likes printouts   10:12 AM, 01/29/24 Tabitha Ewings, DPT Physical Therapy with [place]

## 2024-01-29 NOTE — Progress Notes (Signed)
 Pt came in on the Nurse schedule to receive her pneumonia 23 vaccine. I asked pt what was the reason that she was needing the 23 vaccine and she stated that she had some abnormal labs and that her Allergy  and Asthma dr told her to get the pneumonia 23 vaccine. She said that she was told to go to [facility] to receive vaccine but was told they do not do it there and that she would have to get the vaccine at her PCP office. Hasna and I both reviewed the notes to make sure what the pt stated was in fact what the notes were stating the reason for the pneumovax23. Injection was administered in the Rt Deltoid area without any complaints and was given a vaccine sheet to look over for any side effects that may come about.

## 2024-01-29 NOTE — Progress Notes (Signed)
 Cbc fine A1C(3 month average of sugars) is elevated.  This is considered PreDiabetes.  Work on diet-decrease sugars and starches and aim for 30 minutes of exercise 5 days/week to prevent progression to diabetes

## 2024-02-06 ENCOUNTER — Encounter: Payer: Self-pay | Admitting: Physical Therapy

## 2024-02-06 ENCOUNTER — Ambulatory Visit (INDEPENDENT_AMBULATORY_CARE_PROVIDER_SITE_OTHER): Admitting: Physical Therapy

## 2024-02-06 DIAGNOSIS — M5459 Other low back pain: Secondary | ICD-10-CM | POA: Diagnosis not present

## 2024-02-06 DIAGNOSIS — M6281 Muscle weakness (generalized): Secondary | ICD-10-CM | POA: Diagnosis not present

## 2024-02-06 NOTE — Therapy (Signed)
 OUTPATIENT PHYSICAL THERAPY THORACOLUMBAR TREATMENT   Patient Name: ROSHELL BRIGHAM MRN: [id number] DOB:06-May-1966, 58 y.o., female Today's Date: 02/06/2024  END OF SESSION:  PT End of Session - 02/06/24 1433     Visit Number 4    Number of Visits 16    Date for PT Re-Evaluation 03/28/24    PT Start Time 1434    PT Stop Time 1512    PT Time Calculation (min) 38 min    Activity Tolerance Patient tolerated treatment well    Behavior During Therapy Anxious           Past Medical History:  Diagnosis Date   Adult ADHD    Anemia    history of   Angio-edema    Asthma    Brain injury (HCC)    Headache    Migraines   Heart palpitations    Hemorrhoids, internal, with bleeding and mucous discharge - Grade 1 02/05/2014   Herpes    Hypertension    Migraines    Pneumonia    PTSD (post-traumatic stress disorder)    has a stalker   Recurrent infections 12/20/2023   Restless leg    Rosacea    Shingles    Urticaria    Vertigo    Past Surgical History:  Procedure Laterality Date   CESAREAN SECTION  08/15/1992   son   COLONOSCOPY     DILATATION & CURETTAGE/HYSTEROSCOPY WITH MYOSURE N/A 04/19/2016   Procedure: DILATATION & CURETTAGE/HYSTEROSCOPY WITH MYOSURE;  Surgeon: Evalene SHAUNNA Organ, MD;  Location: [facility];  Service: Gynecology;  Laterality: N/A;  request 7:30am start time   Requests one hour OR time.    HEMORRHOID BANDING     retinal peel left eye surgery Left    retina and cataract   thrush     Patient Active Problem List   Diagnosis Date Noted   Recurrent infections 12/20/2023   Seasonal and perennial allergic rhinitis 12/20/2023   Allergy  with anaphylaxis due to food 12/20/2023   Gastroesophageal reflux disease without esophagitis 11/08/2023   Prediabetes 11/08/2023   HSV (herpes simplex virus) anogenital infection 01/24/2023   Anxiety 11/14/2021   Uterine leiomyoma 11/14/2021   Anemia 11/09/2021   Angioedema 11/09/2021   Urticaria 11/09/2021    Attention deficit hyperactivity disorder 11/09/2021   Cyst of left breast 11/09/2021   Hemorrhoids 11/09/2021   Migraine 11/09/2021   Restless legs 11/09/2021   Rosacea 11/09/2021   Traumatic brain injury (HCC) 11/09/2021   Other adverse food reactions, not elsewhere classified, subsequent encounter 01/04/2021   Adverse effect of other drugs, medicaments and biological substances, subsequent encounter 01/04/2021   Asthma 01/04/2021   HTN (hypertension) 05/14/2019   Right hip pain 08/21/2014   Palpitations 08/04/2014   Latex allergy  02/05/2014   Rectal bleeding 12/10/2013   Allergic rhinitis 09/26/2013   Benign paroxysmal positional vertigo 06/20/2013   Seborrheic keratosis, inflamed 05/23/2013   Fibrocystic breast changes 10/02/2012   Overweight 10/02/2012    PCP: Wendolyn Jenkins Jansky, MD  REFERRING PROVIDER: Jesus Bernardino MATSU, MD  REFERRING DIAG: M54.40 (ICD-10-CM) - Back pain of lumbar region with sciatica  Rationale for Evaluation and Treatment: Rehabilitation  THERAPY DIAG:  Other low back pain  Muscle weakness (generalized)  ONSET DATE: a couple weeks  SUBJECTIVE:  SUBJECTIVE STATEMENT: 02/06/2024 States that she went to  bed with a headache  and woke up with a headache. States she slept weird and it still feels off. States she had to take a tylenol as it was so bad.    EVAL: States she has had sinus issues and has not been very active. States she was trying to get back to exercises with piliates, aerobics and PE class where she was bending a lot and she started having intense pain after a lot of bending. States she thought she had kidney stones so she got a CT which didn't find much. Her pain was so severe she couldn't walk. She has had on and off pain over the years but never like this.    Previously she thought her pain was coming from her hips. She had some SIJ injections and one helped a lot and another did not. States that she thinks she needs to change her sleeping positions because she sleeps in a twisted position. She wants to avoid surgery. No numbness or tingling noted on this date. History of tingling in legs but thinks it's a vein issue.   PERTINENT HISTORY:  Migraines, cesarean, TBI history, incontinence   PAIN:  Are you having pain? Yes: NPRS scale:  current 1/10 Pain location: mid back Pain description: across back, awareness  Aggravating factors: bending, being still, stretching Relieving factors: standing, laying, sitting  PRECAUTIONS: None  RED FLAGS: None   WEIGHT BEARING RESTRICTIONS: No  FALLS:  Has patient fallen in last 6 months? 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   NEXT MD VISIT: June 14th  OBJECTIVE:  Note: Objective measures were completed at Evaluation unless otherwise noted.  DIAGNOSTIC FINDINGS:  12/25/23 Lumbar MRI  IMPRESSION: 1. Moderate levoconvex curvature of the lumbar spine centered at L3-4. 2. Mild foraminal narrowing bilaterally at L2-3. 3. Moderate foraminal stenosis bilaterally at L3-4 left greater than right. 4. Mild right foraminal stenosis at L4-5. 5. Shallow central disc protrusion at L5-S1 without stenosis. 6. Type 1 edematous endplate marrow changes on the right at L2-3 and L3-4. This likely contributes to low back pain.  PATIENT SURVEYS:  Patient-specific activity functional scoring scheme (Point to one number):  0 represents "unable to perform." 10 represents "able to perform at prior level. 0 1 2 3 4 5 6 7 8 9  10 (Date and Score) Activity Initial  Activity Eval     Driving > 1 hr  0    Climbing stairs   5    Walking across room 6     Additional Additional Total score = sum of the activity scores/number of activities Minimum detectable change (90%CI) for average score = 2 points Minimum detectable change (90%CI) for single activity score = 3 points PSFS developed by: Rosalee MYRTIS Marvis KYM Charlet CHRISTELLA., & Binkley, J. (1995). Assessing disability and change on individual  patients: a report of a patient specific measure. Physiotherapy [place], 47, [phone]. Reproduced with the permission of the authors  Score: 11/3=3.66   COGNITION: Overall cognitive status: History of cognitive impairments - at baseline per patient with history of TBI    SENSATION: Not tested     POSTURE: rounded shoulders, forward head, and flexed trunk   PALPATION: Increased resting tone and pain with palpation along right glutes and left lumbar paraspinals  LUMBAR ROM:  AROM eval  Flexion 50% limited *  Extension 90% limited *  Right lateral flexion 50 %limited burning pain*  Left lateral flexion 75% limited*  Right rotation   Left rotation    (Blank rows = not tested) pain    LE Measurements Lower Extremity Right EVAL Left EVAL   A/PROM MMT A/PROM MMT  Hip Flexion WFL 3+ WFL 4*  Hip Extension WFL 4- WFL 4-  Hip Abduction      Hip Adduction      Hip Internal rotation WFL*  WFL*   Hip External rotation [facility]  WFL   Knee Flexion  4  4  Knee Extension  4+  4+  Ankle Dorsiflexion  4+  4+  Ankle Plantarflexion      Ankle Inversion      Ankle Eversion       (Blank rows = not tested) * pain   LUMBAR SPECIAL TESTS:   SLUMP neg B SLR neg B  FUNCTIONAL TESTS:  Pain and breath holding with all bed mobilities      TREATMENT DATE:                                                                                                                               02/06/2024  Therapeutic Exercise: Review of entire HEP, anatomy  Supine: laying on towel roll down spine 3 minutes, neck ROM on ball 2 minutes ROT, neck  flexion/extension on ball 2 minutes . scapular retraction 2 minutes  Prone:   Seated:    Standing: self mobilization to low back and mid back muscles with tennis ball - tolerated well  Neuromuscular Re-education:  Manual Therapy: Therapeutic Activity: Self Care: Trigger Point Dry Needling:  Modalities:     PATIENT EDUCATION:  Education details: on HEP, on anatomy, on rationale behind interventions Person educated: Patient Education method: Explanation, Demonstration, and Handouts Education comprehension: verbalized understanding   HOME EXERCISE PROGRAM: [id number]  ASSESSMENT:  CLINICAL IMPRESSION: 02/06/2024 Focused on neck secondary to recent increase in symptoms and limiting ability to participate in HEP. Patient with bout of vertigo during session, took rest break and symptoms resolved. Added self mobilization to back with tennis ball which was tolerated well. Added new exercises to HEP. Will continue with current POC as tolerated.    EVAL: Patient presents to PT with complaints of chronic low back pain and recent episodic flare up that limited her ability to walk and move at home.  Patient presents with posture, strength and mobility flare ups that are likely contributing to current presentation. No saddle symptoms or incontinence noted on this date, will continue to monitor secondary to history of incontinence. Patient would greatly benefit from skilled PT to improve overall function and QOL.   OBJECTIVE IMPAIRMENTS: decreased activity tolerance, decreased mobility, difficulty walking, decreased ROM, decreased strength, improper body mechanics, postural dysfunction, and pain.   ACTIVITY LIMITATIONS: lifting, bending, stairs, bed mobility, and locomotion level  PARTICIPATION LIMITATIONS:  community activity and occupation  PERSONAL FACTORS: Age, Past/current experiences, and 1-2 comorbidities: hx of TBI, hx of incontinence  are also affecting patient's functional outcome.    REHAB POTENTIAL: Good  CLINICAL DECISION MAKING: Stable/uncomplicated  EVALUATION COMPLEXITY: Low   GOALS: Goals reviewed with patient? yes  SHORT TERM GOALS: Target date: 02/15/2024   Patient will be independent in self management strategies to improve quality of life and functional outcomes. Baseline: New Program Goal status: INITIAL  2.  Patient will report at least 50% improvement in overall symptoms and/or function to demonstrate improved functional mobility Baseline: 0% better Goal status: INITIAL  3.  Patient will report lying on stomach for a few minutes daily to help with decompression of lumbar spine  Baseline: not currently Goal status: INITIAL      LONG TERM GOALS: Target date: 03/28/2024    Patient will report at least 75% improvement in overall symptoms and/or function to demonstrate improved functional mobility Baseline: 0% better Goal status: INITIAL  2.  Patient will score at least 2 points higher on PSFS average to demonstrate change in overall function. Baseline: see above Goal status: INITIAL  3.  Patient will be able to demonstrate proper bending mechanics to reduce risk of re-injury Baseline: not currently Goal status: INITIAL     PLAN:  PT FREQUENCY: 1-2x/week for a total of 16 visits over 12 week certification period   PT DURATION: 12 weeks  PLANNED INTERVENTIONS: 97110-Therapeutic exercises, 97530- Therapeutic activity, 97112- Neuromuscular re-education, 97535- Self Care, [id number]- Manual therapy, [phone]- Gait training, [phone]- Orthotic Fit/training, [phone]- Canalith repositioning, [id number]- Aquatic Therapy, [id number]- Electrical stimulation (unattended), [phone]- Ionotophoresis 4mg /ml Dexamethasone , Patient/Family education, Balance training, Stair training, Taping, Dry Needling, Joint mobilization, Joint manipulation, Spinal manipulation, Spinal mobilization, Cryotherapy, and Moist heat   PLAN FOR NEXT SESSION: f/u with prone extension series, stability  exercises, LE/core strength, LTR, try manual 90/90 relief if painful  likes printouts  Life plan for workout   3:16 PM, 02/06/24 Olivia Church, DPT Physical Therapy with [place]

## 2024-02-12 ENCOUNTER — Ambulatory Visit

## 2024-02-12 ENCOUNTER — Telehealth: Payer: Self-pay | Admitting: *Deleted

## 2024-02-12 ENCOUNTER — Ambulatory Visit (INDEPENDENT_AMBULATORY_CARE_PROVIDER_SITE_OTHER): Admitting: Physical Therapy

## 2024-02-12 ENCOUNTER — Encounter: Payer: Self-pay | Admitting: Physical Therapy

## 2024-02-12 DIAGNOSIS — M5459 Other low back pain: Secondary | ICD-10-CM

## 2024-02-12 DIAGNOSIS — M6281 Muscle weakness (generalized): Secondary | ICD-10-CM | POA: Diagnosis not present

## 2024-02-12 NOTE — Telephone Encounter (Signed)
 Patient stated that her allergist told her that 2 weeks after getting pneumonia shot blood work needed to be done to make sure the vaccine worked. Patient stated that allergist was supposed to put the orders in. She stated that office is not open today for her to ask them if she should do the labs. Patient questioned if she should do blood work. Patient stated she has had a headache since vaccine, but denies having any sick symptoms.

## 2024-02-12 NOTE — Therapy (Signed)
 OUTPATIENT PHYSICAL THERAPY THORACOLUMBAR TREATMENT   Patient Name: VIRGINIA CURL MRN: [id number] DOB:1966/04/17, 58 y.o., female Today's Date: 02/12/2024  END OF SESSION:  PT End of Session - 02/12/24 1346     Visit Number 5    Number of Visits 16    Date for PT Re-Evaluation 03/28/24    PT Start Time 1346    PT Stop Time 1425    PT Time Calculation (min) 39 min    Activity Tolerance Patient tolerated treatment well    Behavior During Therapy Anxious           Past Medical History:  Diagnosis Date   Adult ADHD    Anemia    history of   Angio-edema    Asthma    Brain injury (HCC)    Headache    Migraines   Heart palpitations    Hemorrhoids, internal, with bleeding and mucous discharge - Grade 1 02/05/2014   Herpes    Hypertension    Migraines    Pneumonia    PTSD (post-traumatic stress disorder)    has a stalker   Recurrent infections 12/20/2023   Restless leg    Rosacea    Shingles    Urticaria    Vertigo    Past Surgical History:  Procedure Laterality Date   CESAREAN SECTION  08/15/1992   son   COLONOSCOPY     DILATATION & CURETTAGE/HYSTEROSCOPY WITH MYOSURE N/A 04/19/2016   Procedure: DILATATION & CURETTAGE/HYSTEROSCOPY WITH MYOSURE;  Surgeon: Evalene SHAUNNA Organ, MD;  Location: [facility];  Service: Gynecology;  Laterality: N/A;  request 7:30am start time   Requests one hour OR time.    HEMORRHOID BANDING     retinal peel left eye surgery Left    retina and cataract   thrush     Patient Active Problem List   Diagnosis Date Noted   Recurrent infections 12/20/2023   Seasonal and perennial allergic rhinitis 12/20/2023   Allergy  with anaphylaxis due to food 12/20/2023   Gastroesophageal reflux disease without esophagitis 11/08/2023   Prediabetes 11/08/2023   HSV (herpes simplex virus) anogenital infection 01/24/2023   Anxiety 11/14/2021   Uterine leiomyoma 11/14/2021   Anemia 11/09/2021   Angioedema 11/09/2021   Urticaria 11/09/2021    Attention deficit hyperactivity disorder 11/09/2021   Cyst of left breast 11/09/2021   Hemorrhoids 11/09/2021   Migraine 11/09/2021   Restless legs 11/09/2021   Rosacea 11/09/2021   Traumatic brain injury (HCC) 11/09/2021   Other adverse food reactions, not elsewhere classified, subsequent encounter 01/04/2021   Adverse effect of other drugs, medicaments and biological substances, subsequent encounter 01/04/2021   Asthma 01/04/2021   HTN (hypertension) 05/14/2019   Right hip pain 08/21/2014   Palpitations 08/04/2014   Latex allergy  02/05/2014   Rectal bleeding 12/10/2013   Allergic rhinitis 09/26/2013   Benign paroxysmal positional vertigo 06/20/2013   Seborrheic keratosis, inflamed 05/23/2013   Fibrocystic breast changes 10/02/2012   Overweight 10/02/2012    PCP: Wendolyn Jenkins Jansky, MD  REFERRING PROVIDER: Jesus Bernardino MATSU, MD  REFERRING DIAG: M54.40 (ICD-10-CM) - Back pain of lumbar region with sciatica  Rationale for Evaluation and Treatment: Rehabilitation  THERAPY DIAG:  Other low back pain  Muscle weakness (generalized)  ONSET DATE: a couple weeks  SUBJECTIVE:  SUBJECTIVE STATEMENT: 02/12/2024 States that her headache is moving around and she got it the day after she had an pneumonia shot. States she tried to clean her car last week and the next day she had back pain. States she also had to ride three hours the following day. States she did not have excruciating pain but it was not feeling good. States she took her medication and it seemed to help a bit. States she didn't use her lumbar support on the way down but used it on the way back and it felt good on the way home.    EVAL: States she has had sinus issues and has not been very active. States she was trying to get back to exercises  with piliates, aerobics and PE class where she was bending a lot and she started having intense pain after a lot of bending. States she thought she had kidney stones so she got a CT which didn't find much. Her pain was so severe she couldn't walk. She has had on and off pain over the years but never like this.   Previously she thought her pain was coming from her hips. She had some SIJ injections and one helped a lot and another did not. States that she thinks she needs to change her sleeping positions because she sleeps in a twisted position. She wants to avoid surgery. No numbness or tingling noted on this date. History of tingling in legs but thinks it's a vein issue.   PERTINENT HISTORY:  Migraines, cesarean, TBI history, incontinence   PAIN:  Are you having pain? Yes: NPRS scale:  current 0/10 Pain location: mid back Pain description: across back, awareness  Aggravating factors: bending, being still, stretching Relieving factors: standing, laying, sitting  PRECAUTIONS: None  RED FLAGS: None   WEIGHT BEARING RESTRICTIONS: No  FALLS:  Has patient fallen in last 6 months? 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   NEXT MD VISIT: June 14th  OBJECTIVE:  Note: Objective measures were completed at Evaluation unless otherwise noted.  DIAGNOSTIC FINDINGS:  12/25/23 Lumbar MRI  IMPRESSION: 1. Moderate levoconvex curvature of the lumbar spine centered at L3-4. 2. Mild foraminal narrowing bilaterally at L2-3. 3. Moderate foraminal stenosis bilaterally at L3-4 left greater than right. 4. Mild right foraminal stenosis at L4-5. 5. Shallow central disc protrusion at L5-S1 without stenosis. 6. Type 1 edematous endplate marrow changes on the right at L2-3 and L3-4. This likely contributes to low back pain.  PATIENT SURVEYS:  Patient-specific activity  functional scoring scheme (Point to one number):  0 represents "unable to perform." 10 represents "able to perform at prior level. 0 1 2 3 4 5 6 7 8 9  10 (Date and Score) Activity Initial  Activity Eval     Driving > 1 hr  0    Climbing stairs   5    Walking across room 6    Additional Additional Total score = sum of the activity scores/number of activities Minimum detectable change (90%CI) for average score = 2 points Minimum detectable change (90%CI) for single activity score = 3 points PSFS developed by: Rosalee MYRTIS Marvis KYM Charlet CHRISTELLA., & Binkley, J. (1995). Assessing disability and change on individual  patients: a report of a patient specific measure. Physiotherapy [place], 47, [phone]. Reproduced with the permission of the authors  Score: 11/3=3.66   COGNITION: Overall cognitive status: History of cognitive impairments - at baseline per patient with history of TBI    SENSATION: Not tested     POSTURE: rounded shoulders, forward head, and flexed trunk   PALPATION: Increased resting tone and pain with palpation along right glutes and left lumbar paraspinals  LUMBAR ROM:   AROM eval  Flexion 50% limited *  Extension 90% limited *  Right lateral flexion 50 %limited burning pain*  Left lateral flexion 75% limited*  Right rotation   Left rotation    (Blank rows = not tested) pain    LE Measurements Lower Extremity Right EVAL Left EVAL   A/PROM MMT A/PROM MMT  Hip Flexion WFL 3+ WFL 4*  Hip Extension WFL 4- WFL 4-  Hip Abduction      Hip Adduction      Hip Internal rotation WFL*  WFL*   Hip External rotation [facility]  WFL   Knee Flexion  4  4  Knee Extension  4+  4+  Ankle Dorsiflexion  4+  4+  Ankle Plantarflexion      Ankle Inversion      Ankle Eversion       (Blank rows = not tested) * pain   LUMBAR SPECIAL TESTS:   SLUMP neg B SLR neg B  FUNCTIONAL TESTS:  Pain and breath holding with all bed mobilities      TREATMENT DATE:                                                                                                                                02/12/2024  Therapeutic Exercise: Review of entire HEP, anatomy, on importance of when and which exercises to perform for back pain, on difference between pelvic floor and low back incontinence issues. Supine: LTR 2 minutes, SAQS 2 minutes B, Dkc 2 minutes  Prone: lying on pillow with heat --> no pillow --> POE 15 MINUTES   Seated:    Standing: self mobilization to low back and mid back muscles with tennis ball - tolerated well  Neuromuscular Re-education:  Manual Therapy: Therapeutic Activity: Self Care: Trigger Point Dry Needling:  Modalities: thermotherapy to low back during prone interventions     PATIENT EDUCATION:  Education details: on HEP, on anatomy, on rationale behind interventions Person educated: Patient Education method: Explanation, Demonstration, and Handouts Education comprehension: verbalized understanding   HOME EXERCISE PROGRAM: [id number]  ASSESSMENT:  CLINICAL IMPRESSION: 02/12/2024 Reviewed HEP and reminded patient of different types of exercises and when and how to perform prone exercises for back pain. Tolerated heat and prone exercises well with reduced tension and pulling noted with prone series. Reduced symptoms end of session. Will continue with current POC as tolerated   EVAL: Patient presents to PT with complaints of chronic low back pain and recent episodic flare up that limited her ability to walk and move at home.  Patient presents with posture, strength  and mobility flare ups that are likely contributing to current presentation. No saddle symptoms or incontinence noted on this date, will continue to monitor secondary to history of incontinence. Patient would greatly benefit from skilled PT to improve overall function and QOL.   OBJECTIVE IMPAIRMENTS: decreased activity tolerance, decreased mobility, difficulty walking, decreased  ROM, decreased strength, improper body mechanics, postural dysfunction, and pain.   ACTIVITY LIMITATIONS: lifting, bending, stairs, bed mobility, and locomotion level  PARTICIPATION LIMITATIONS: community activity and occupation  PERSONAL FACTORS: Age, Past/current experiences, and 1-2 comorbidities: hx of TBI, hx of incontinence  are also affecting patient's functional outcome.   REHAB POTENTIAL: Good  CLINICAL DECISION MAKING: Stable/uncomplicated  EVALUATION COMPLEXITY: Low   GOALS: Goals reviewed with patient? yes  SHORT TERM GOALS: Target date: 02/15/2024   Patient will be independent in self management strategies to improve quality of life and functional outcomes. Baseline: New Program Goal status: INITIAL  2.  Patient will report at least 50% improvement in overall symptoms and/or function to demonstrate improved functional mobility Baseline: 0% better Goal status: INITIAL  3.  Patient will report lying on stomach for a few minutes daily to help with decompression of lumbar spine  Baseline: not currently Goal status: INITIAL      LONG TERM GOALS: Target date: 03/28/2024    Patient will report at least 75% improvement in overall symptoms and/or function to demonstrate improved functional mobility Baseline: 0% better Goal status: INITIAL  2.  Patient will score at least 2 points higher on PSFS average to demonstrate change in overall function. Baseline: see above Goal status: INITIAL  3.  Patient will be able to demonstrate proper bending mechanics to reduce risk of re-injury Baseline: not currently Goal status: INITIAL     PLAN:  PT FREQUENCY: 1-2x/week for a total of 16 visits over 12 week certification period   PT DURATION: 12 weeks  PLANNED INTERVENTIONS: 97110-Therapeutic exercises, 97530- Therapeutic activity, 97112- Neuromuscular re-education, 97535- Self Care, [id number]- Manual therapy, [phone]- Gait training, [phone]- Orthotic Fit/training, [phone]- Canalith  repositioning, [id number]- Aquatic Therapy, [id number]- Electrical stimulation (unattended), [phone]- Ionotophoresis 4mg /ml Dexamethasone , Patient/Family education, Balance training, Stair training, Taping, Dry Needling, Joint mobilization, Joint manipulation, Spinal manipulation, Spinal mobilization, Cryotherapy, and Moist heat   PLAN FOR NEXT SESSION: f/u with prone extension series, stability exercises, LE/core strength, LTR, try manual 90/90 relief if painful  likes printouts  Life plan for workout   2:29 PM, 02/12/24 Olivia Church, DPT Physical Therapy with [facility]

## 2024-02-13 ENCOUNTER — Encounter: Payer: Self-pay | Admitting: *Deleted

## 2024-02-13 NOTE — Telephone Encounter (Signed)
 Patient notified of message below.

## 2024-02-14 ENCOUNTER — Ambulatory Visit (INDEPENDENT_AMBULATORY_CARE_PROVIDER_SITE_OTHER): Admitting: Physical Therapy

## 2024-02-14 ENCOUNTER — Encounter: Payer: Self-pay | Admitting: Physical Therapy

## 2024-02-14 DIAGNOSIS — M6281 Muscle weakness (generalized): Secondary | ICD-10-CM

## 2024-02-14 DIAGNOSIS — M5459 Other low back pain: Secondary | ICD-10-CM

## 2024-02-14 NOTE — Therapy (Signed)
 OUTPATIENT PHYSICAL THERAPY THORACOLUMBAR TREATMENT PHYSICAL THERAPY DISCHARGE SUMMARY  Visits from Start of Care: 6  Current functional level related to goals / functional outcomes: See below   Remaining deficits: See below   Education / Equipment: See below   Patient agrees to discharge. Patient goals were met. Patient is being discharged due to being pleased with the current functional level.   Patient Name: Samantha Delacruz MRN: [id number] DOB:[birth year], 58 y.o., female Today's Date: 02/14/2024  END OF SESSION:  PT End of Session - 02/14/24 1509     Visit Number 6    Number of Visits 16    Date for PT Re-Evaluation 03/28/24    PT Start Time 1515    PT Stop Time 1553    PT Time Calculation (min) 38 min    Activity Tolerance Patient tolerated treatment well    Behavior During Therapy Anxious           Past Medical History:  Diagnosis Date   Adult ADHD    Anemia    history of   Angio-edema    Asthma    Brain injury (HCC)    Headache    Migraines   Heart palpitations    Hemorrhoids, internal, with bleeding and mucous discharge - Grade 1 02/05/2014   Herpes    Hypertension    Migraines    Pneumonia    PTSD (post-traumatic stress disorder)    has a stalker   Recurrent infections 12/20/2023   Restless leg    Rosacea    Shingles    Urticaria    Vertigo    Past Surgical History:  Procedure Laterality Date   CESAREAN SECTION  08/15/1992   son   COLONOSCOPY     DILATATION & CURETTAGE/HYSTEROSCOPY WITH MYOSURE N/A 04/19/2016   Procedure: DILATATION & CURETTAGE/HYSTEROSCOPY WITH MYOSURE;  Surgeon: Evalene SHAUNNA Organ, MD;  Location: [facility];  Service: Gynecology;  Laterality: N/A;  request 7:30am start time   Requests one hour OR time.    HEMORRHOID BANDING     retinal peel left eye surgery Left    retina and cataract   thrush     Patient Active Problem List   Diagnosis Date Noted   Recurrent infections 12/20/2023   Seasonal and perennial allergic  rhinitis 12/20/2023   Allergy  with anaphylaxis due to food 12/20/2023   Gastroesophageal reflux disease without esophagitis 11/08/2023   Prediabetes 11/08/2023   HSV (herpes simplex virus) anogenital infection 01/24/2023   Anxiety 11/14/2021   Uterine leiomyoma 11/14/2021   Anemia 11/09/2021   Angioedema 11/09/2021   Urticaria 11/09/2021   Attention deficit hyperactivity disorder 11/09/2021   Cyst of left breast 11/09/2021   Hemorrhoids 11/09/2021   Migraine 11/09/2021   Restless legs 11/09/2021   Rosacea 11/09/2021   Traumatic brain injury (HCC) 11/09/2021   Other adverse food reactions, not elsewhere classified, subsequent encounter 01/04/2021   Adverse effect of other drugs, medicaments and biological substances, subsequent encounter 01/04/2021   Asthma 01/04/2021   HTN (hypertension) 05/14/2019   Right hip pain 08/21/2014   Palpitations 08/04/2014   Latex allergy  02/05/2014   Rectal bleeding 12/10/2013   Allergic rhinitis 09/26/2013   Benign paroxysmal positional vertigo 06/20/2013   Seborrheic keratosis, inflamed 05/23/2013   Fibrocystic breast changes 10/02/2012   Overweight 10/02/2012    PCP: Wendolyn Jenkins Jansky, MD  REFERRING PROVIDER: Jesus Bernardino MATSU, MD  REFERRING DIAG: M54.40 (ICD-10-CM) - Back pain of lumbar region with sciatica  Rationale for Evaluation and Treatment:  Rehabilitation  THERAPY DIAG:  Other low back pain  Muscle weakness (generalized)  ONSET DATE: a couple weeks  SUBJECTIVE:                                                                                                                                                                                           SUBJECTIVE STATEMENT: 02/14/2024 States she did a lot of bending and walking and feeling muscles activate. Overall she feels about 85% better.   EVAL: States she has had sinus issues and has not been very active. States she was trying to get back to exercises with piliates, aerobics and  PE class where she was bending a lot and she started having intense pain after a lot of bending. States she thought she had kidney stones so she got a CT which didn't find much. Her pain was so severe she couldn't walk. She has had on and off pain over the years but never like this.   Previously she thought her pain was coming from her hips. She had some SIJ injections and one helped a lot and another did not. States that she thinks she needs to change her sleeping positions because she sleeps in a twisted position. She wants to avoid surgery. No numbness or tingling noted on this date. History of tingling in legs but thinks it's a vein issue.   PERTINENT HISTORY:  Migraines, cesarean, TBI history, incontinence   PAIN:  Are you having pain? Yes: NPRS scale:  current 0/10 Pain location: mid back Pain description: across back, awareness  Aggravating factors: bending, being still, stretching Relieving factors: standing, laying, sitting  PRECAUTIONS: None  RED FLAGS: None   WEIGHT BEARING RESTRICTIONS: No  FALLS:  Has patient fallen in last 6 months? 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      OBJECTIVE:  Note: Objective measures were completed at Evaluation unless otherwise noted.  DIAGNOSTIC FINDINGS:  12/25/23 Lumbar MRI  IMPRESSION: 1. Moderate levoconvex curvature of the lumbar spine centered at L3-4. 2. Mild foraminal narrowing bilaterally at L2-3. 3. Moderate foraminal stenosis bilaterally at L3-4 left greater than right. 4. Mild right foraminal stenosis at L4-5. 5. Shallow central disc protrusion at L5-S1 without stenosis. 6. Type 1 edematous endplate marrow changes on the right at L2-3 and L3-4. This likely contributes to low back pain.  PATIENT SURVEYS:  Patient-specific activity functional scoring scheme (Point to one number):   0 represents "unable to perform." 10 represents "able to  perform at prior level. 0 1 2 3 4 5 6 7 8 9  10 (Date and Score) Activity Initial  Activity Eval  7/2   Driving > 1 hr  0 6   Climbing stairs   5  7  Walking across room 6 10   Additional Additional Total score = sum of the activity scores/number of activities Minimum detectable change (90%CI) for average score = 2 points Minimum detectable change (90%CI) for single activity score = 3 points PSFS developed by: Rosalee MYRTIS Marvis KYM Charlet CHRISTELLA., & Binkley, J. (1995). Assessing disability and change on individual  patients: a report of a patient specific measure. Physiotherapy [place], 47, [phone]. Reproduced with the permission of the authors  Score: EVAL: 11/3=3.66;  7/2 23/3=7.66   COGNITION: Overall cognitive status: History of cognitive impairments - at baseline per patient with history of TBI    SENSATION: Not tested     POSTURE: rounded shoulders, forward head, and flexed trunk   PALPATION: Increased resting tone and pain with palpation along right glutes and left lumbar paraspinals  LUMBAR ROM:   AROM eval 7/2  Flexion 50% limited * 25% limited  Extension 90% limited * 25% limited  Right lateral flexion 50 %limited burning pain* 25% limited  Left lateral flexion 75% limited* 25% limited*  Right rotation    Left rotation     (Blank rows = not tested) *pain    LE Measurements Lower Extremity Right 7/2 Left 7/2   A/PROM MMT A/PROM MMT  Hip Flexion WFL 4+ WFL 4+  Hip Extension WFL 4- WFL 4-  Hip Abduction      Hip Adduction      Hip Internal rotation [facility]   Hip External rotation [facility]  WFL   Knee Flexion  5  5  Knee Extension  5  5  Ankle Dorsiflexion  5  5  Ankle Plantarflexion      Ankle Inversion      Ankle Eversion       (Blank rows = not tested) * pain   LUMBAR SPECIAL TESTS:   SLUMP neg B SLR neg B  FUNCTIONAL TESTS:  Pain and breath holding with all bed mobilities       TREATMENT DATE:                                                                                                                               02/14/2024  Therapeutic Exercise:  Objective measures updated,, reviewed how to use medbridge app/online component  90/90 position with heat for reduced muscles spasm--> heel slides in this position for low back stretch Supine: LTR 2 minutes,   Prone:    Seated:    Standing:   Neuromuscular Re-education:  Manual Therapy: Therapeutic Activity: Self Care: Trigger Point Dry Needling:  Modalities: thermotherapy to low back during supine interventions     PATIENT EDUCATION:  Education details: on HEP, on anatomy, on rationale  behind interventions Person educated: Patient Education method: Explanation, Demonstration, and Handouts Education comprehension: verbalized understanding   HOME EXERCISE PROGRAM: [id number]  ASSESSMENT:  CLINICAL IMPRESSION: 02/14/2024 Overall patient doing well and has met all but one goal. Reviewed lifting mechanics and answered all questions. Patient to DC from PT to HEP secondary to progress made.    EVAL: Patient presents to PT with complaints of chronic low back pain and recent episodic flare up that limited her ability to walk and move at home.  Patient presents with posture, strength and mobility flare ups that are likely contributing to current presentation. No saddle symptoms or incontinence noted on this date, will continue to monitor secondary to history of incontinence. Patient would greatly benefit from skilled PT to improve overall function and QOL.   OBJECTIVE IMPAIRMENTS: decreased activity tolerance, decreased mobility, difficulty walking, decreased ROM, decreased strength, improper body mechanics, postural dysfunction, and pain.   ACTIVITY LIMITATIONS: lifting, bending, stairs, bed mobility, and locomotion level  PARTICIPATION LIMITATIONS: community activity and occupation  PERSONAL FACTORS: Age,  Past/current experiences, and 1-2 comorbidities: hx of TBI, hx of incontinence  are also affecting patient's functional outcome.   REHAB POTENTIAL: Good  CLINICAL DECISION MAKING: Stable/uncomplicated  EVALUATION COMPLEXITY: Low   GOALS: Goals reviewed with patient? yes  SHORT TERM GOALS: Target date: 02/15/2024   Patient will be independent in self management strategies to improve quality of life and functional outcomes. Baseline: New Program Goal status: MET  2.  Patient will report at least 50% improvement in overall symptoms and/or function to demonstrate improved functional mobility Baseline: 0% better Goal status: MET  3.  Patient will report lying on stomach for a few minutes daily to help with decompression of lumbar spine  Baseline: not currently Goal status: MET      LONG TERM GOALS: Target date: 03/28/2024    Patient will report at least 75% improvement in overall symptoms and/or function to demonstrate improved functional mobility Baseline: 0% better Goal status: MET  2.  Patient will score at least 2 points higher on PSFS average to demonstrate change in overall function. Baseline: see above Goal status: MET  3.  Patient will be able to demonstrate proper bending mechanics to reduce risk of re-injury Baseline: not currently Goal status: Progressing      PLAN:  PT FREQUENCY: 1-2x/week for a total of 16 visits over 12 week certification period   PT DURATION: 12 weeks  PLANNED INTERVENTIONS: 97110-Therapeutic exercises, 97530- Therapeutic activity, 97112- Neuromuscular re-education, 97535- Self Care, [id number]- Manual therapy, [phone]- Gait training, [phone]- Orthotic Fit/training, [phone]- Canalith repositioning, [id number]- Aquatic Therapy, 97014- Electrical stimulation (unattended), [phone]- Ionotophoresis 4mg /ml Dexamethasone , Patient/Family education, Balance training, Stair training, Taping, Dry Needling, Joint mobilization, Joint manipulation, Spinal manipulation,  Spinal mobilization, Cryotherapy, and Moist heat   PLAN FOR NEXT SESSION: DC to HEP     4:01 PM, 02/14/24 Olivia Church, DPT Physical Therapy with [facility]

## 2024-02-27 ENCOUNTER — Ambulatory Visit: Admitting: Family Medicine

## 2024-02-27 ENCOUNTER — Encounter: Admitting: Physical Therapy

## 2024-02-29 ENCOUNTER — Encounter: Admitting: Physical Therapy

## 2024-03-01 ENCOUNTER — Ambulatory Visit: Payer: Self-pay | Admitting: Family Medicine

## 2024-03-01 ENCOUNTER — Encounter: Payer: Self-pay | Admitting: Family Medicine

## 2024-03-01 ENCOUNTER — Ambulatory Visit: Admitting: Family Medicine

## 2024-03-01 VITALS — BP 114/74 | HR 74 | Temp 97.9°F | Resp 18 | Ht 66.0 in | Wt 183.4 lb

## 2024-03-01 DIAGNOSIS — H833X3 Noise effects on inner ear, bilateral: Secondary | ICD-10-CM | POA: Diagnosis not present

## 2024-03-01 DIAGNOSIS — G245 Blepharospasm: Secondary | ICD-10-CM | POA: Diagnosis not present

## 2024-03-01 DIAGNOSIS — J328 Other chronic sinusitis: Secondary | ICD-10-CM

## 2024-03-01 DIAGNOSIS — I10 Essential (primary) hypertension: Secondary | ICD-10-CM

## 2024-03-01 DIAGNOSIS — R42 Dizziness and giddiness: Secondary | ICD-10-CM | POA: Diagnosis not present

## 2024-03-01 LAB — CBC WITH DIFFERENTIAL/PLATELET
Basophils Absolute: 0 K/uL (ref 0.0–0.1)
Basophils Relative: 0.4 % (ref 0.0–3.0)
Eosinophils Absolute: 0.2 K/uL (ref 0.0–0.7)
Eosinophils Relative: 2 % (ref 0.0–5.0)
HCT: 43.5 % (ref 36.0–46.0)
Hemoglobin: 14.7 g/dL (ref 12.0–15.0)
Lymphocytes Relative: 35.8 % (ref 12.0–46.0)
Lymphs Abs: 2.7 K/uL (ref 0.7–4.0)
MCHC: 33.9 g/dL (ref 30.0–36.0)
MCV: 86.6 fl (ref 78.0–100.0)
Monocytes Absolute: 0.7 K/uL (ref 0.1–1.0)
Monocytes Relative: 8.7 % (ref 3.0–12.0)
Neutro Abs: 4 K/uL (ref 1.4–7.7)
Neutrophils Relative %: 53.1 % (ref 43.0–77.0)
Platelets: 321 K/uL (ref 150.0–400.0)
RBC: 5.02 Mil/uL (ref 3.87–5.11)
RDW: 13.1 % (ref 11.5–15.5)
WBC: 7.6 K/uL (ref 4.0–10.5)

## 2024-03-01 LAB — COMPREHENSIVE METABOLIC PANEL WITH GFR
ALT: 13 U/L (ref 0–35)
AST: 18 U/L (ref 0–37)
Albumin: 4.6 g/dL (ref 3.5–5.2)
Alkaline Phosphatase: 76 U/L (ref 39–117)
BUN: 13 mg/dL (ref 6–23)
CO2: 27 meq/L (ref 19–32)
Calcium: 9.3 mg/dL (ref 8.4–10.5)
Chloride: 102 meq/L (ref 96–112)
Creatinine, Ser: 0.7 mg/dL (ref 0.40–1.20)
GFR: 94.74 mL/min (ref >60.00)
Glucose, Bld: 93 mg/dL (ref 70–99)
Potassium: 3.7 meq/L (ref 3.5–5.1)
Sodium: 139 meq/L (ref 135–145)
Total Bilirubin: 0.6 mg/dL (ref 0.2–1.2)
Total Protein: 7.5 g/dL (ref 6.0–8.3)

## 2024-03-01 LAB — SEDIMENTATION RATE: Sed Rate: 15 mm/h (ref 0–30)

## 2024-03-01 LAB — TSH: TSH: 1.89 u[IU]/mL (ref 0.35–5.50)

## 2024-03-01 LAB — MAGNESIUM: Magnesium: 2 mg/dL (ref 1.5–2.5)

## 2024-03-01 MED ORDER — AMLODIPINE BESYLATE 2.5 MG PO TABS: 2.5000 mg | ORAL_TABLET | Freq: Every day | ORAL | 1 refills | Status: AC

## 2024-03-01 NOTE — Patient Instructions (Signed)
 It was very nice to see you today!  Restart amlodipine  2.5 mg daily ER if new symptoms    PLEASE NOTE:  If you had any lab tests please let us  know if you have not heard back within a few days. You may see your results on MyChart before we have a chance to review them but we will give you a call once they are reviewed by us . If we ordered any referrals today, please let us  know if you have not heard from their office within the next week.   Please try these tips to maintain a healthy lifestyle:  Eat most of your calories during the day when you are active. Eliminate processed foods including packaged sweets (pies, cakes, cookies), reduce intake of potatoes, white bread, white pasta, and white rice. Look for whole grain options, oat flour or almond flour.  Each meal should contain half fruits/vegetables, one quarter protein, and one quarter carbs (no bigger than a computer mouse).  Cut down on sweet beverages. This includes juice, soda, and sweet tea. Also watch fruit intake, though this is a healthier sweet option, it still contains natural sugar! Limit to 3 servings daily.  Drink at least 1 glass of water with each meal and aim for at least 8 glasses per day  Exercise at least 150 minutes every week.

## 2024-03-01 NOTE — Progress Notes (Signed)
 Labs ok.  The strep pneumo is pending

## 2024-03-01 NOTE — Progress Notes (Signed)
 Subjective:     Patient ID: Samantha Delacruz, female    DOB: [birth year], 58 y.o.   MRN: [id number]  Chief Complaint  Patient presents with   Medical Management of Chronic Issues    3 month follow-up Discuss blood pressure medication Having some vertigo Has a bump on top of head, sore Had coffee, no food Swelling in throat, bit tongue on both sides the other day    HPI Discussed the use of AI scribe software for clinical note transcription with the patient, who gave verbal consent to proceed.  History of Present Illness Samantha Delacruz is a 58 year old female with hypertension and asthma who presents for a general follow-up and evaluation of vertigo and hearing issues.  She has been experiencing vertigo for approximately two and a half weeks, which is positional and was first identified during a physical therapy session for her back. The vertigo was severe enough to cause her to miss a previous appointment. Although it has improved, she continues to experience twitching in her left eye. She has a history of retinal and cataract surgery, and caffeine exacerbates her eye twitching.  She experienced sudden hearing loss in her right ear while traveling, initially attributing it to an issue with the car radio. This hearing loss occurred a couple of times, with a significant decrease in volume, but she did not notice any change in her ability to hear her friend speaking during these episodes.  She has a history of traumatic brain injury and is concerned about the possibility of a stroke, especially given her recent difficulty recalling words, such as 'probiotics'. She denies slurred speech or coordination issues. She reports headaches and ocular migraines following a pneumonia vaccine she received about a month ago, which were intense and coincided with thunderstorms, a known trigger for her migraines.  She stopped taking her blood pressure medication, amlodipine , because she  felt it was causing her blood pressure to drop too low, making her feel unwell. Her recent blood pressure readings have been variable, with some readings indicating borderline hypertension. She is concerned about the risk of stroke and is monitoring her blood pressure closely.  She reports biting her tongue on both sides, which led to swelling i. She is worried about the possibility of infection but notes that her mouth typically heals quickly. Additionally, she has a bump on her head that she is concerned about, given her history of sun exposure without sunscreen and the risk of skin cancer.  She has an upcoming appointment with an eye doctor in September and is concerned about her ongoing issues, including the eye twitching and floaters in her right eye. She is trying to reduce her caffeine intake gradually to see if it helps with the eye twitching.  Saw allergist for chronic sinusitis-got pneumovax.  Needs titers.    Health Maintenance Due  Topic Date Due   Hepatitis B Vaccines (1 of 3 - 19+ 3-dose series) Never done    Past Medical History:  Diagnosis Date   Adult ADHD    Anemia    history of   Angio-edema    Asthma    Brain injury (HCC)    Headache    Migraines   Heart palpitations    Hemorrhoids, internal, with bleeding and mucous discharge - Grade 1 02/05/2014   Herpes    Hypertension    Migraines    Pneumonia    PTSD (post-traumatic stress disorder)    has a stalker  Recurrent infections 12/20/2023   Restless leg    Rosacea    Shingles    Urticaria    Vertigo     Past Surgical History:  Procedure Laterality Date   CESAREAN SECTION  08/15/1992   son   COLONOSCOPY     DILATATION & CURETTAGE/HYSTEROSCOPY WITH MYOSURE N/A 04/19/2016   Procedure: DILATATION & CURETTAGE/HYSTEROSCOPY WITH MYOSURE;  Surgeon: Evalene SHAUNNA Organ, MD;  Location: [facility];  Service: Gynecology;  Laterality: N/A;  request 7:30am start time   Requests one hour OR time.    HEMORRHOID BANDING      retinal peel left eye surgery Left    retina and cataract   thrush       Current Outpatient Medications:    albuterol  (VENTOLIN  HFA) 108 (90 Base) MCG/ACT inhaler, Inhale 2 puffs into the lungs every 6 (six) hours as needed for wheezing or shortness of breath., Disp: 3 each, Rfl: 1   amLODipine  (NORVASC ) 2.5 MG tablet, Take 1 tablet (2.5 mg total) by mouth daily., Disp: 90 tablet, Rfl: 1   azelastine  (ASTELIN ) 0.1 % nasal spray, Place 2 sprays into both nostrils 2 (two) times daily as needed for rhinitis. Use in each nostril as directed, Disp: 30 mL, Rfl: 4   budesonide -formoterol  (SYMBICORT ) 160-4.5 MCG/ACT inhaler, Inhale 2 puffs into the lungs 2 (two) times daily., Disp: 10.2 g, Rfl: 3   celecoxib  (CELEBREX ) 100 MG capsule, Take 1 capsule (100 mg total) by mouth 2 (two) times daily. Try instead of advil..choose preferred., Disp: 90 capsule, Rfl: 1   Cholecalciferol (VITAMIN D3) 125 MCG (5000 UT) CAPS, Take by mouth., Disp: , Rfl:    EPINEPHrine  (AUVI-Q ) 0.3 mg/0.3 mL IJ SOAJ injection, Inject 0.3 mg into the muscle as needed for anaphylaxis., Disp: 1 each, Rfl: 1   fluticasone  (FLONASE ) 50 MCG/ACT nasal spray, Place 1 spray into both nostrils daily., Disp: 16 g, Rfl: 3   gabapentin  (NEURONTIN ) 300 MG capsule, Take 1 capsule (300 mg total) by mouth 3 (three) times daily., Disp: 90 capsule, Rfl: 3   GARLIC PO, Take by mouth., Disp: , Rfl:    Ginger, Zingiber officinalis, (GINGER PO), Take by mouth., Disp: , Rfl:    lactobacillus acidophilus (BACID) TABS tablet, Take 1 tablet by mouth daily., Disp: , Rfl:    Multiple Vitamin (MULTIVITAMIN ADULT PO), Take by mouth., Disp: , Rfl:    pantoprazole  (PROTONIX ) 40 MG tablet, Take 1 tablet (40 mg total) by mouth daily., Disp: 90 tablet, Rfl: 1   TURMERIC PO, Take by mouth., Disp: , Rfl:    VAGIFEM  10 MCG TABS vaginal tablet, Haven't started yet, Disp: , Rfl:    valACYclovir  (VALTREX ) 500 MG tablet, Take 1 tablet (500 mg total) by mouth daily.,  Disp: 90 tablet, Rfl: 3  Allergies  Allergen Reactions   Latex Rash and Dermatitis   Green Dyes Hives    Green, yellow, blue   Influenza Virus Vaccine Nausea And Vomiting   Blue Dye #1 (Brilliant Blue) Hives    blue dye   Blue Dyes (Parenteral) Hives    blue dye   Codeine Hives   Lactose Diarrhea   Lactose Intolerance (Gi) Diarrhea    cramping   Meloxicam Other (See Comments)    meloxicam   Sulfa Antibiotics Hives   Yellow Dye Hives   ROS neg/noncontributory except as noted HPI/below      Objective:     BP 114/74   Pulse 74   Temp 97.9 F (36.6 C) (  Temporal)   Resp 18   Ht 5' 6 (1.676 m)   Wt 183 lb 6 oz (83.2 kg)   LMP 05/15/2017   SpO2 98%   BMI 29.60 kg/m  Wt Readings from Last 3 Encounters:  03/01/24 183 lb 6 oz (83.2 kg)  12/27/23 183 lb (83 kg)  12/22/23 178 lb 12.8 oz (81.1 kg)    Physical Exam   Gen: WDWN NAD HEENT: NCAT, conjunctiva not injected, sclera nonicteric OP-no tongue lesions/swelling NECK:  supple, no thyromegaly, no nodes, no carotid bruits CARDIAC: RRR, S1S2+, no murmur. DP 2+B LUNGS: CTAB. No wheezes ABDOMEN:  BS+, soft, NTND, No HSM, no masses EXT:  no edema MSK: no gross abnormalities.  NEURO: A&O x3.  CN II-XII intact.  F-n, ram, pronator drift neg.  No nystamus.  No eye twitch PSYCH: normal mood. Good eye contact  Reviewed allergist notes     Assessment & Plan:  Eye twitch -     CBC with Differential/Platelet -     Comprehensive metabolic panel with GFR -     TSH -     Magnesium -     Sedimentation rate  Noise-induced hearing loss of both ears  Vertigo  Primary hypertension -     amLODIPine  Besylate; Take 1 tablet (2.5 mg total) by mouth daily.  Dispense: 90 tablet; Refill: 1  Other chronic sinusitis -     Strep pneumoniae 23 Serotypes IgG  Assessment and Plan Assessment & Plan Vertigo   Positional vertigo has persisted for over a week and a half, with symptoms of eye twitching and episodes of sudden hearing  loss, particularly in the right ear. A history of traumatic brain injury complicates the clinical picture. A CT scan was considered but deferred by pt due to cost concerns, pending lab results. Order blood tests to check calcium, magnesium, and thyroid  levels. Consider a CT scan of the head if symptoms persist or worsen. Advise an emergency room visit if symptoms worsen significantly.  Ocular Migraines   Ocular migraines have intensified following a pneumonia vaccine on June 16, with triggers including thunderstorms and possibly the vaccine. Symptoms include severe headaches and eye twitching. Avoid known triggers such as caffeine and thunderstorms.  Hypertension   Hypertension management is challenging due to fluctuating blood pressure readings. Current readings are borderline, with some low values and a high reading of 154/74. Amlodipine  was previously discontinued due to hypotension concerns. The current reading in the office is 114/74. Restart amlodipine  at a micro dose of 2.5 mg-pt feels better doing something than risking stroke. Monitor blood pressure regularly. Follow up in three months.  General Health Maintenance   She received a pneumonia vaccine on June 16 due to low markers identified by an allergist. Follow-up blood work was suggested to check vaccine efficacy, but there is a logistical issue with performing the test at this facility. Follow up with the allergist for blood work to check vaccine efficacy.    Return in about 3 months (around 06/01/2024) for HTN.  Jenkins CHRISTELLA Carrel, MD

## 2024-03-04 ENCOUNTER — Encounter: Admitting: Physical Therapy

## 2024-03-05 LAB — STREP PNEUMONIAE 23 SEROTYPES IGG
Serotype 17 (17F): 9.5
Serotype 2 (2): 1.5
Serotype 20 (20): 17
Serotype 22 (22F): 52.8
Serotype 34 (10A): 15.1
Serotype 43 (11A): >39
Serotype 54 (15B): 26.4
Serotype 57 (19A): 1.2
Serotype 70 (33F): 39.4
Strep pneumo Type 12: 2.5
Strep pneumo Type 19: 17.8
Strep pneumo Type 4: 0.6
Strep pneumo Type 9: 12.4
Strep pneumoniae Type 1 Abs: 45.9
Strep pneumoniae Type 14 Abs: 34.4
Strep pneumoniae Type 18C Abs: 11.8
Strep pneumoniae Type 23F Abs: 0.4
Strep pneumoniae Type 3 Abs: 12.3
Strep pneumoniae Type 5 Abs: 11.2
Strep pneumoniae Type 6B Abs: 36.2
Strep pneumoniae Type 7F Abs: >110
Strep pneumoniae Type 8 Abs: 36.9
Strep pneumoniae Type 9N Abs: 1.1

## 2024-03-06 ENCOUNTER — Encounter: Admitting: Physical Therapy

## 2024-03-10 NOTE — Progress Notes (Signed)
 Strep results forwarded to allergist

## 2024-03-11 ENCOUNTER — Encounter: Admitting: Physical Therapy

## 2024-03-25 ENCOUNTER — Encounter: Admitting: Physical Therapy

## 2024-03-27 ENCOUNTER — Encounter: Admitting: Physical Therapy

## 2024-03-29 ENCOUNTER — Ambulatory Visit: Admitting: Family Medicine

## 2024-04-01 ENCOUNTER — Encounter: Admitting: Physical Therapy

## 2024-04-04 ENCOUNTER — Encounter: Admitting: Physical Therapy

## 2024-04-08 ENCOUNTER — Encounter: Admitting: Physical Therapy

## 2024-04-11 ENCOUNTER — Encounter: Admitting: Physical Therapy

## 2024-05-05 ENCOUNTER — Other Ambulatory Visit: Payer: Self-pay | Admitting: Family Medicine

## 2024-05-05 DIAGNOSIS — A609 Anogenital herpesviral infection, unspecified: Secondary | ICD-10-CM

## 2024-06-04 ENCOUNTER — Other Ambulatory Visit: Payer: Self-pay | Admitting: Family Medicine

## 2024-06-04 ENCOUNTER — Encounter: Payer: Self-pay | Admitting: Family Medicine

## 2024-06-04 ENCOUNTER — Ambulatory Visit (INDEPENDENT_AMBULATORY_CARE_PROVIDER_SITE_OTHER): Admitting: Family Medicine

## 2024-06-04 VITALS — BP 128/80 | HR 78 | Temp 98.2°F | Ht 66.0 in | Wt 185.4 lb

## 2024-06-04 DIAGNOSIS — K219 Gastro-esophageal reflux disease without esophagitis: Secondary | ICD-10-CM

## 2024-06-04 DIAGNOSIS — I10 Essential (primary) hypertension: Secondary | ICD-10-CM

## 2024-06-04 DIAGNOSIS — R600 Localized edema: Secondary | ICD-10-CM

## 2024-06-04 MED ORDER — PANTOPRAZOLE SODIUM 40 MG PO TBEC
40.0000 mg | DELAYED_RELEASE_TABLET | Freq: Every day | ORAL | 1 refills | Status: AC
Start: 2024-06-04 — End: ?

## 2024-06-04 MED ORDER — EPINEPHRINE 0.3 MG/0.3ML IJ SOAJ: 0.3000 mg | INTRAMUSCULAR | 1 refills | Status: DC | PRN

## 2024-06-04 NOTE — Patient Instructions (Signed)
 Talk to gyn about breasts enlarging  Exercise.  Work on diet.

## 2024-06-04 NOTE — Progress Notes (Signed)
 Subjective:     Patient ID: Samantha Delacruz, female    DOB: 28-Apr-1966, 58 y.o.   MRN: [id number]  Chief Complaint  Patient presents with   Hypertension    67mo follow up; doing good, but think I still feel better not being on bp meds and noticing weight gain     Discussed the use of AI scribe software for clinical note transcription with the patient, who gave verbal consent to proceed.  History of Present Illness Samantha Delacruz is a 58 year old female with hypertension who presents for follow-up on her high blood pressure.  She has experienced an improvement in her overall health since her last visit. She feels better off her blood pressure medication due to swelling, particularly in her breasts, despite being on a low dose. She has resumed taking the medication but notices that the swelling subsides when she skips doses for a few days. She has not monitored her blood pressure during these periods.  No chest pains, shortness of breath, coughing, dizziness, asthma, or headaches, except for a two-day episode of severe headaches, which she attributes to stress and potential migraine triggers.  She is concerned about her weight, currently at 185 pounds, and acknowledges a decrease in exercise due to back issues and an overly tight pelvic floor. She exercises once a week but feels better when not exercising.  She is not currently taking several medications, including Celebrex  and gabapentin , but uses them as needed. She is taking pantoprazole  for stomach issues and has an EpiPen  that needs refilling. She has not taken her shingles vaccine since 2020 and has a history of shingles since age 77. She does not receive flu shots.  She is experiencing significant breast enlargement, and her ankles and fingers are not swollen. She does not take any hormones except for Vagifem , which she has not started yet.  GERD-doing well on protonix     There are no preventive care reminders  to display for this patient.  Past Medical History:  Diagnosis Date   Adult ADHD    Anemia    history of   Angio-edema    Asthma    Brain injury (HCC)    Headache    Migraines   Heart palpitations    Hemorrhoids, internal, with bleeding and mucous discharge - Grade 1 02/05/2014   Herpes    Hypertension    Migraines    Pneumonia    PTSD (post-traumatic stress disorder)    has a stalker   Recurrent infections 12/20/2023   Restless leg    Rosacea    Shingles    Urticaria    Vertigo     Past Surgical History:  Procedure Laterality Date   CESAREAN SECTION  08/15/1992   son   COLONOSCOPY     DILATATION & CURETTAGE/HYSTEROSCOPY WITH MYOSURE N/A 04/19/2016   Procedure: DILATATION & CURETTAGE/HYSTEROSCOPY WITH MYOSURE;  Surgeon: Evalene SHAUNNA Organ, MD;  Location: [facility];  Service: Gynecology;  Laterality: N/A;  request 7:30am start time   Requests one hour OR time.    HEMORRHOID BANDING     retinal peel left eye surgery Left    retina and cataract   thrush       Current Outpatient Medications:    albuterol  (VENTOLIN  HFA) 108 (90 Base) MCG/ACT inhaler, Inhale 2 puffs into the lungs every 6 (six) hours as needed for wheezing or shortness of breath., Disp: 3 each, Rfl: 1   amLODipine  (NORVASC ) 2.5 MG tablet, Take  1 tablet (2.5 mg total) by mouth daily., Disp: 90 tablet, Rfl: 1   azelastine  (ASTELIN ) 0.1 % nasal spray, Place 2 sprays into both nostrils 2 (two) times daily as needed for rhinitis. Use in each nostril as directed, Disp: 30 mL, Rfl: 4   budesonide -formoterol  (SYMBICORT ) 160-4.5 MCG/ACT inhaler, Inhale 2 puffs into the lungs 2 (two) times daily., Disp: 10.2 g, Rfl: 3   celecoxib  (CELEBREX ) 100 MG capsule, Take 1 capsule (100 mg total) by mouth 2 (two) times daily. Try instead of advil..choose preferred., Disp: 90 capsule, Rfl: 1   Cholecalciferol (VITAMIN D3) 125 MCG (5000 UT) CAPS, Take by mouth., Disp: , Rfl:    fluticasone  (FLONASE ) 50 MCG/ACT nasal spray, Place 1  spray into both nostrils daily., Disp: 16 g, Rfl: 3   gabapentin  (NEURONTIN ) 300 MG capsule, Take 1 capsule (300 mg total) by mouth 3 (three) times daily., Disp: 90 capsule, Rfl: 3   GARLIC PO, Take by mouth., Disp: , Rfl:    Ginger, Zingiber officinalis, (GINGER PO), Take by mouth., Disp: , Rfl:    lactobacillus acidophilus (BACID) TABS tablet, Take 1 tablet by mouth daily., Disp: , Rfl:    Multiple Vitamin (MULTIVITAMIN ADULT PO), Take by mouth., Disp: , Rfl:    TURMERIC PO, Take by mouth., Disp: , Rfl:    VAGIFEM  10 MCG TABS vaginal tablet, Haven't started yet, Disp: , Rfl:    valACYclovir  (VALTREX ) 500 MG tablet, Take 1 tablet (500 mg total) by mouth daily., Disp: 90 tablet, Rfl: 3   EPINEPHrine  (AUVI-Q ) 0.3 mg/0.3 mL IJ SOAJ injection, Inject 0.3 mg into the muscle as needed for anaphylaxis., Disp: 1 each, Rfl: 1   pantoprazole  (PROTONIX ) 40 MG tablet, Take 1 tablet (40 mg total) by mouth daily., Disp: 90 tablet, Rfl: 1  Allergies  Allergen Reactions   Latex Rash and Dermatitis   Green Dyes Hives    Green, yellow, blue   Influenza Virus Vaccine Nausea And Vomiting   Blue Dye #1 (Brilliant Blue) Hives    blue dye   Blue Dyes (Parenteral) Hives    blue dye   Codeine Hives   Lactose Diarrhea   Lactose Intolerance (Gi) Diarrhea    cramping   Meloxicam Other (See Comments)    meloxicam   Sulfa Antibiotics Hives   Yellow Dye #6 (Sunset Yellow) Hives   ROS neg/noncontributory except as noted HPI/below      Objective:     BP 128/80   Pulse 78   Temp 98.2 F (36.8 C)   Ht 5' 6 (1.676 m)   Wt 185 lb 6.4 oz (84.1 kg)   LMP 05/15/2017   SpO2 98%   BMI 29.92 kg/m  Wt Readings from Last 3 Encounters:  06/04/24 185 lb 6.4 oz (84.1 kg)  03/01/24 183 lb 6 oz (83.2 kg)  12/27/23 183 lb (83 kg)    Physical Exam MEASUREMENTS: Weight- 185. GENERAL: Well developed well nourished no acute distress HEAD EYES EARS NOSE THROAT: Normocephalic atraumatic, conjunctiva not injected,  sclera nonicteric CARDIAC: Regular rate and rhythm, S1 S2 present , no murmur, dorsalis pedis 2 plus bilaterally NECK: Supple, no thyromegaly, no nodes, no carotid bruits LUNGS: Clear to auscultation bilaterally, no wheezes ABDOMEN: Bowel sounds present, soft, non tender non distended, no hepatosplenomegaly, no masses EXTREMITIES: No edema MUSCULOSKELETAL: No gross abnormalities NEUROLOGICAL: Alert and oriented x3, cranial nerves II through XII intact PSYCHIATRIC: Normal mood, good eye contact       Assessment & Plan:  Primary hypertension  Gastroesophageal reflux disease without esophagitis -     Pantoprazole  Sodium; Take 1 tablet (40 mg total) by mouth daily.  Dispense: 90 tablet; Refill: 1  Localized edema  Other orders -     EPINEPHrine ; Inject 0.3 mg into the muscle as needed for anaphylaxis.  Dispense: 1 each; Refill: 1    Assessment and Plan Assessment & Plan Hypertension   Hypertension is managed with amlodipine  2.5 mg. She feels better off the medication due to breast swelling, though this is not linked to amlodipine . No recent blood pressure readings are available, and she has no dizziness, chest pain, or headaches related to hypertension. Continue amlodipine  2.5 mg daily and monitor blood pressure regularly. Discuss breast swelling with OB GYN.  Obesity   She weighs 185 pounds and exercises once a week due to back issues and pelvic floor dysfunction. Emphasized the importance of weight management and exercise during menopause to prevent weight gain and maintain muscle mass. Increase physical activity starting with 5 minutes daily, gradually increasing as tolerated. Focus on dietary improvements for weight management and preventing progression to DM.  Menopausal state   Experiencing breast enlargement, possibly hormonal, with no hormone replacement therapy currently. Discussed the importance of exercise and diet during menopause to prevent osteoporosis and manage weight.  Consult OB GYN regarding breast enlargement and potential hormonal causes. Consider weight-bearing exercises to prevent osteoporosis.  Chronic back pain   Chronic back pain limits exercise, and she feels better when not exercising due to back issues and a tight pelvic floor. Consider pool exercises to reduce joint pressure while maintaining cardiovascular health.  Pelvic floor dysfunction   Pelvic floor dysfunction contributes to discomfort during exercise.  General Health Maintenance   No flu shot taken. Discussed the importance of maintaining health and preventing illness, especially in environments with high exposure to viruses.  GERD-chronic.  Controlled.  Contrinue protonix  40mg  daily     Return in about 6 months (around 12/03/2024) for chronic follow-up.  Jenkins CHRISTELLA Carrel, MD

## 2024-06-04 NOTE — Telephone Encounter (Signed)
 Spoke with patient to verify different pharmacy to send Epi Pen due to prev pharmacy not able to fill. Patient would like it sent to [facility] [address].

## 2024-06-05 ENCOUNTER — Other Ambulatory Visit: Payer: Self-pay | Admitting: Family Medicine

## 2024-06-05 MED ORDER — EPINEPHRINE 0.3 MG/0.3ML IJ SOAJ: 0.3000 mg | INTRAMUSCULAR | 1 refills | Status: AC | PRN

## 2024-08-14 ENCOUNTER — Other Ambulatory Visit: Payer: Self-pay | Admitting: Family Medicine

## 2024-08-14 DIAGNOSIS — Z1231 Encounter for screening mammogram for malignant neoplasm of breast: Secondary | ICD-10-CM

## 2024-08-16 ENCOUNTER — Other Ambulatory Visit: Payer: Self-pay | Admitting: Internal Medicine

## 2024-08-16 DIAGNOSIS — M5440 Lumbago with sciatica, unspecified side: Secondary | ICD-10-CM

## 2024-09-04 ENCOUNTER — Ambulatory Visit
Admission: RE | Admit: 2024-09-04 | Discharge: 2024-09-04 | Disposition: A | Source: Ambulatory Visit | Attending: Family Medicine | Admitting: Family Medicine

## 2024-09-04 DIAGNOSIS — Z1231 Encounter for screening mammogram for malignant neoplasm of breast: Secondary | ICD-10-CM

## 2024-09-11 ENCOUNTER — Ambulatory Visit: Payer: Self-pay | Admitting: Family Medicine

## 2024-09-11 ENCOUNTER — Telehealth: Payer: Self-pay | Admitting: Radiology

## 2024-09-11 NOTE — Telephone Encounter (Signed)
STAT read requested

## 2024-12-06 ENCOUNTER — Ambulatory Visit: Admitting: Family Medicine
